# Patient Record
Sex: Female | Born: 1945 | Race: Black or African American | Hispanic: No | Marital: Married | Smoking: Former smoker
Health system: Southern US, Community
[De-identification: ages and names within clinical notes are randomized; demographics above are authoritative.]

## PROBLEM LIST (undated history)

## (undated) DIAGNOSIS — K219 Gastro-esophageal reflux disease without esophagitis: Secondary | ICD-10-CM

## (undated) DIAGNOSIS — R7303 Prediabetes: Secondary | ICD-10-CM

## (undated) DIAGNOSIS — E559 Vitamin D deficiency, unspecified: Secondary | ICD-10-CM

## (undated) DIAGNOSIS — H269 Unspecified cataract: Secondary | ICD-10-CM

## (undated) DIAGNOSIS — T7840XA Allergy, unspecified, initial encounter: Secondary | ICD-10-CM

## (undated) DIAGNOSIS — I10 Essential (primary) hypertension: Secondary | ICD-10-CM

## (undated) DIAGNOSIS — E785 Hyperlipidemia, unspecified: Secondary | ICD-10-CM

## (undated) HISTORY — DX: Prediabetes: R73.03

## (undated) HISTORY — DX: Unspecified cataract: H26.9

## (undated) HISTORY — DX: Essential (primary) hypertension: I10

## (undated) HISTORY — DX: Hyperlipidemia, unspecified: E78.5

## (undated) HISTORY — DX: Allergy, unspecified, initial encounter: T78.40XA

## (undated) HISTORY — DX: Gastro-esophageal reflux disease without esophagitis: K21.9

## (undated) HISTORY — DX: Vitamin D deficiency, unspecified: E55.9

---

## 1975-01-17 HISTORY — PX: ABDOMINAL HYSTERECTOMY: SHX81

## 1998-12-27 ENCOUNTER — Encounter: Payer: Self-pay | Admitting: Internal Medicine

## 1998-12-27 ENCOUNTER — Ambulatory Visit (HOSPITAL_COMMUNITY): Admission: RE | Admit: 1998-12-27 | Discharge: 1998-12-27 | Payer: Self-pay | Admitting: Internal Medicine

## 2001-02-14 ENCOUNTER — Encounter: Payer: Self-pay | Admitting: Internal Medicine

## 2001-02-14 ENCOUNTER — Ambulatory Visit (HOSPITAL_COMMUNITY): Admission: RE | Admit: 2001-02-14 | Discharge: 2001-02-14 | Payer: Self-pay | Admitting: Internal Medicine

## 2003-03-09 ENCOUNTER — Ambulatory Visit (HOSPITAL_COMMUNITY): Admission: RE | Admit: 2003-03-09 | Discharge: 2003-03-09 | Payer: Self-pay | Admitting: Internal Medicine

## 2006-04-20 ENCOUNTER — Ambulatory Visit: Payer: Self-pay | Admitting: Internal Medicine

## 2009-07-29 ENCOUNTER — Encounter: Payer: Self-pay | Admitting: Gastroenterology

## 2009-09-06 ENCOUNTER — Encounter (INDEPENDENT_AMBULATORY_CARE_PROVIDER_SITE_OTHER): Payer: Self-pay | Admitting: *Deleted

## 2009-09-08 ENCOUNTER — Ambulatory Visit: Payer: Self-pay | Admitting: Gastroenterology

## 2009-09-16 ENCOUNTER — Inpatient Hospital Stay (HOSPITAL_COMMUNITY): Admission: EM | Admit: 2009-09-16 | Discharge: 2009-09-17 | Payer: Self-pay | Admitting: Emergency Medicine

## 2009-09-16 HISTORY — PX: KNEE SURGERY: SHX244

## 2009-09-22 ENCOUNTER — Inpatient Hospital Stay (HOSPITAL_COMMUNITY): Admission: EM | Admit: 2009-09-22 | Discharge: 2009-09-27 | Payer: Self-pay | Admitting: Orthopedic Surgery

## 2009-12-16 ENCOUNTER — Encounter (INDEPENDENT_AMBULATORY_CARE_PROVIDER_SITE_OTHER): Payer: Self-pay | Admitting: *Deleted

## 2010-01-13 ENCOUNTER — Encounter (INDEPENDENT_AMBULATORY_CARE_PROVIDER_SITE_OTHER): Payer: Self-pay | Admitting: *Deleted

## 2010-01-18 ENCOUNTER — Ambulatory Visit
Admission: RE | Admit: 2010-01-18 | Discharge: 2010-01-18 | Payer: Self-pay | Source: Home / Self Care | Attending: Gastroenterology | Admitting: Gastroenterology

## 2010-02-01 ENCOUNTER — Ambulatory Visit
Admission: RE | Admit: 2010-02-01 | Discharge: 2010-02-01 | Payer: Self-pay | Source: Home / Self Care | Attending: Gastroenterology | Admitting: Gastroenterology

## 2010-02-01 ENCOUNTER — Other Ambulatory Visit: Payer: Self-pay | Admitting: Gastroenterology

## 2010-02-08 ENCOUNTER — Encounter: Payer: Self-pay | Admitting: Gastroenterology

## 2010-02-17 NOTE — Letter (Signed)
Summary: Pre Visit Letter Revised  [facility]  [address] [place], [place] [address]   Phone: [phone]  Fax: [phone]        12/16/2009 MRN: [id number] [facility] [address] [place], [place]  [address]             Procedure Date:  02-01-10   Welcome to the Gastroenterology Division at [facility].    You are scheduled to see a nurse for your pre-procedure visit on 01-18-10 at 1:30p.m. on the 3rd floor at [facility], [address].  We ask that you try to arrive at our office 15 minutes prior to your appointment time to allow for check-in.  Please take a minute to review the attached form.  If you answer "Yes" to one or more of the questions on the first page, we ask that you call the person listed at your earliest opportunity.  If you answer "No" to all of the questions, please complete the rest of the form and bring it to your appointment.    Your nurse visit will consist of discussing your medical and surgical history, your immediate family medical history, and your medications.   If you are unable to list all of your medications on the form, please bring the medication bottles to your appointment and we will list them.  We will need to be aware of both prescribed and over the counter drugs.  We will need to know exact dosage information as well.    Please be prepared to read and sign documents such as consent forms, a financial agreement, and acknowledgement forms.  If necessary, and with your consent, a friend or relative is welcome to sit-in on the nurse visit with you.  Please bring your insurance card so that we may make a copy of it.  If your insurance requires a referral to see a specialist, please bring your referral form from your primary care physician.  No co-pay is required for this nurse visit.     If you cannot keep your appointment, please call [phone] to cancel or reschedule prior to your appointment date.  This  allows [place] the opportunity to schedule an appointment for another patient in need of care.    Thank you for choosing  Gastroenterology for your medical needs.  We appreciate the opportunity to care for you.  Please visit [place] at our website  to learn more about our practice.  Sincerely, The Gastroenterology Division

## 2010-02-17 NOTE — Miscellaneous (Signed)
Summary: [facility] Previsit/prep  Clinical Lists Changes  Observations: Added new observation of NKA: T (01/18/2010 13:04)

## 2010-02-17 NOTE — Letter (Signed)
Summary: [facility] Instructions  [facility]  [address] [place], [place] [address]   Phone: [phone]  Fax: [phone]       JACQUELYNE QUARRY    03/26/1945    MRN: [id number]        Procedure Day Dorna Bloom:  [facility]  09/22/09     Arrival Time:  9:00AM      Procedure Time:  10:00AM     Location of Procedure:                    _ X_  [facility] (4th Floor)                       PREPARATION FOR COLONOSCOPY WITH MOVIPREP   Starting 5 days prior to your procedure 09/17/09 do not eat nuts, seeds, popcorn, corn, beans, peas,  salads, or any raw vegetables.  Do not take any fiber supplements (e.g. Metamucil, Citrucel, and Benefiber).  THE DAY BEFORE YOUR PROCEDURE         DATE: 09/21/09  DAY: TUESDAY  1.  Drink clear liquids the entire day-NO SOLID FOOD  2.  Do not drink anything colored red or purple.  Avoid juices with pulp.  No orange juice.  3.  Drink at least 64 oz. (8 glasses) of fluid/clear liquids during the day to prevent dehydration and help the prep work efficiently.  CLEAR LIQUIDS INCLUDE: Water Jello Ice Popsicles Tea (sugar ok, no milk/cream) Powdered fruit flavored drinks Coffee (sugar ok, no milk/cream) Gatorade Juice: apple, white grape, white cranberry  Lemonade Clear bullion, consomm, broth Carbonated beverages (any kind) Strained chicken noodle soup Hard Candy                             4.  In the morning, mix first dose of MoviPrep solution:    Empty 1 Pouch A and 1 Pouch B into the disposable container    Add lukewarm drinking water to the top line of the container. Mix to dissolve    Refrigerate (mixed solution should be used within 24 hrs)  5.  Begin drinking the prep at 5:00 p.m. The MoviPrep container is divided by 4 marks.   Every 15 minutes drink the solution down to the next mark (approximately 8 oz) until the full liter is complete.   6.  Follow completed prep with 16 oz of clear liquid of your choice (Nothing  red or purple).  Continue to drink clear liquids until bedtime.  7.  Before going to bed, mix second dose of MoviPrep solution:    Empty 1 Pouch A and 1 Pouch B into the disposable container    Add lukewarm drinking water to the top line of the container. Mix to dissolve    Refrigerate  THE DAY OF YOUR PROCEDURE      DATE: 09/22/09  DAY: WEDNESDAY  Beginning at 5:00AM (5 hours before procedure):         1. Every 15 minutes, drink the solution down to the next mark (approx 8 oz) until the full liter is complete.  2. Follow completed prep with 16 oz. of clear liquid of your choice.    3. You may drink clear liquids until 8:00AM (2 HOURS BEFORE PROCEDURE).   MEDICATION INSTRUCTIONS  Unless otherwise instructed, you should take regular prescription medications with a small sip of water   as early as possible the morning  of your procedure.  Additional instructions:  Hold HCTZ the morning of procedure.       OTHER INSTRUCTIONS  You will need a responsible adult at least 66 years of age to accompany you and drive you home.   This person must remain in the waiting room during your procedure.  Wear loose fitting clothing that is easily removed.  Leave jewelry and other valuables at home.  However, you may wish to bring a book to read or  an iPod/MP3 player to listen to music as you wait for your procedure to start.  Remove all body piercing jewelry and leave at home.  Total time from sign-in until discharge is approximately 2-3 hours.  You should go home directly after your procedure and rest.  You can resume normal activities the  day after your procedure.  The day of your procedure you should not:   Drive   Make legal decisions   Operate machinery   Drink alcohol   Return to work  You will receive specific instructions about eating, activities and medications before you leave.    The above instructions have been reviewed and explained to me by   Wyona Almas  RN  September 08, 2009 10:12 AM     I fully understand and can verbalize these instructions _____________________________ Date _________

## 2010-02-17 NOTE — Procedures (Addendum)
Summary: Colonoscopy  Patient: Kaitlyn Townsend Note: All result statuses are Final unless otherwise noted.  Tests: (1) Colonoscopy (COL)   COL Colonoscopy           DONE     [facility]     [address]     [place], [place]  [address]           COLONOSCOPY PROCEDURE REPORT           PATIENT:  Kaitlyn Townsend, Kaitlyn Townsend  MR#:  [id number]     BIRTHDATE:  11/07/1945, 64 yrs. old  GENDER:  female           ENDOSCOPIST:  Barbette Hair. Arlyce Dice, MD     Referred by:  Lucky Cowboy, M.D.           PROCEDURE DATE:  02/01/2010     PROCEDURE:  Colon with cold biopsy polypectomy     ASA CLASS:  Class I     INDICATIONS:  1) Routine Risk Screening           MEDICATIONS:   Fentanyl 75 mcg IV, Versed 8 mg IV           DESCRIPTION OF PROCEDURE:   After the risks benefits and     alternatives of the procedure were thoroughly explained, informed     consent was obtained.  Digital rectal exam was performed and     revealed no abnormalities.   The LB 180AL [id number] endoscope was     introduced through the anus and advanced to the cecum, which was     identified by the ileocecal valve, without limitations.  The     quality of the prep was excellent, using MoviPrep.  The instrument     was then slowly withdrawn as the colon was fully examined.     <<PROCEDUREIMAGES>>           FINDINGS:  Two polyps were found in the ascending colon. 2 1-43mm     sessile polyps The polyp was removed using cold biopsy forceps.     Scattered diverticula were found (see image2). sigmoid to cecum     Internal hemorrhoids were found (see image10).  This was otherwise     a normal examination of the colon (see image4, image5, and     image9).   Retroflexed views in the rectum revealed no     abnormalities.    The time to cecum =  2.50  minutes. The scope     was then withdrawn (time =  9.25  min) from the patient and the     procedure completed.           COMPLICATIONS:  None           ENDOSCOPIC IMPRESSION:     1) Two  polyps in the ascending colon     2) Diverticula, scattered     3) Internal hemorrhoids     4) Otherwise normal examination     RECOMMENDATIONS:     1) If the polyp(s) removed today are proven to be adenomatous     (pre-cancerous) polyps, you will need a repeat colonoscopy in 5     years. Otherwise you should continue to follow colorectal cancer     screening guidelines for "routine risk" patients with colonoscopy     in 10 years.           REPEAT EXAM:   You will receive a letter from Dr. Arlyce Dice in 1-2  weeks, after reviewing the final pathology, with followup     recommendations.           ______________________________     Barbette Hair Arlyce Dice, MD           CC:           n.     eSIGNED:   Barbette Hair. Chanie Soucek at 02/01/2010 09:30 AM           Kaitlyn Townsend, [id number]  Note: An exclamation mark (!) indicates a result that was not dispersed into the flowsheet. Document Creation Date: 02/01/2010 9:30 AM _______________________________________________________________________  (1) Order result status: Final Collection or observation date-time: 02/01/2010 09:17 Requested date-time:  Receipt date-time:  Reported date-time:  Referring Physician:   Ordering Physician: Melvia Heaps [phone]) Specimen Source:  Source: Launa Grill Order Number: [id number] Lab site:   Appended Document: Colonoscopy     Procedures Next Due Date:    Colonoscopy: 01/2015

## 2010-02-17 NOTE — Letter (Signed)
Summary: Patient Notice- Polyp Results  [facility]  [address] [place], [place] [address]   Phone: [phone]  Fax: [phone]        February 08, 2010 MRN: [id number]    [facility] [address] [place] [address] [place], [place]  [address]    Dear Ms. Krigbaum,  I am pleased to inform you that the colon polyp(s) removed during your recent colonoscopy was (were) found to be benign (no cancer detected) upon pathologic examination.  I recommend you have a repeat colonoscopy examination in _5 years to look for recurrent polyps, as having colon polyps increases your risk for having recurrent polyps or even colon cancer in the future.  Should you develop new or worsening symptoms of abdominal pain, bowel habit changes or bleeding from the rectum or bowels, please schedule an evaluation with either your primary care physician or with me.  Additional information/recommendations:  __ No further action with gastroenterology is needed at this time. Please      follow-up with your primary care physician for your other healthcare      needs.  __ Please call [phone] to schedule a return visit to review your      situation.  __ Please keep your follow-up visit as already scheduled.  __ Continue treatment plan as outlined the day of your exam.  Please call us if you are having persistent problems or have questions about your condition that have not been fully answered at this time.  Sincerely,  Louis Meckel MD  This letter has been electronically signed by your physician.  Appended Document: Patient Notice- Polyp Results LETTER MAILED

## 2010-02-17 NOTE — Letter (Signed)
Summary: Moviprep Instructions  [facility]  [address]   [place], [place] [address]   Phone: [phone]  Fax: [phone]       Kaitlyn Townsend    1945-09-11    MRN: [id number]        Procedure Day /Date: Tuesday  02-01-10     Arrival Time: 8:00 a.m.     Procedure Time: 9:00 a.m.     Location of Procedure:                    x   [facility] (4th Floor)                        PREPARATION FOR COLONOSCOPY WITH MOVIPREP   Starting 5 days prior to your procedure 01-27-10 do not eat nuts, seeds, popcorn, corn, beans, peas,  salads, or any raw vegetables.  Do not take any fiber supplements (e.g. Metamucil, Citrucel, and Benefiber).  THE DAY BEFORE YOUR PROCEDURE         DATE: 01-31-10  DAY: Monday  1.  Drink clear liquids the entire day-NO SOLID FOOD  2.  Do not drink anything colored red or purple.  Avoid juices with pulp.  No orange juice.  3.  Drink at least 64 oz. (8 glasses) of fluid/clear liquids during the day to prevent dehydration and help the prep work efficiently.  CLEAR LIQUIDS INCLUDE: Water Jello Ice Popsicles Tea (sugar ok, no milk/cream) Powdered fruit flavored drinks Coffee (sugar ok, no milk/cream) Gatorade Juice: apple, white grape, white cranberry  Lemonade Clear bullion, consomm, broth Carbonated beverages (any kind) Strained chicken noodle soup Hard Candy                             4.  In the morning, mix first dose of MoviPrep solution:    Empty 1 Pouch A and 1 Pouch B into the disposable container    Add lukewarm drinking water to the top line of the container. Mix to dissolve    Refrigerate (mixed solution should be used within 24 hrs)  5.  Begin drinking the prep at 5:00 p.m. The MoviPrep container is divided by 4 marks.   Every 15 minutes drink the solution down to the next mark (approximately 8 oz) until the full liter is complete.   6.  Follow completed prep with 16 oz of clear liquid of your choice  (Nothing red or purple).  Continue to drink clear liquids until bedtime.  7.  Before going to bed, mix second dose of MoviPrep solution:    Empty 1 Pouch A and 1 Pouch B into the disposable container    Add lukewarm drinking water to the top line of the container. Mix to dissolve            Refrigerate THE DAY OF YOUR PROCEDURE      DATE: 02-01-10  DAY: Tuesday  Beginning at 4:00 a.m. (5 hours before procedure):         1. Every 15 minutes, drink the solution down to the next mark (approx 8 oz) until the full liter is complete.  2. Follow completed prep with 16 oz. of clear liquid of your choice.    3. You may drink clear liquids until 7:00 a.m.  (2 HOURS BEFORE PROCEDURE).   MEDICATION INSTRUCTIONS  Unless otherwise instructed, you should take regular prescription medications with a small sip of  water   as early as possible the morning of your procedure.    Additional medication instructions: Hold HCTZ the morning of procedure.         OTHER INSTRUCTIONS  You will need a responsible adult at least 66 years of age to accompany you and drive you home.   This person must remain in the waiting room during your procedure.  Wear loose fitting clothing that is easily removed.  Leave jewelry and other valuables at home.  However, you may wish to bring a book to read or  an iPod/MP3 player to listen to music as you wait for your procedure to start.  Remove all body piercing jewelry and leave at home.  Total time from sign-in until discharge is approximately 2-3 hours.  You should go home directly after your procedure and rest.  You can resume normal activities the  day after your procedure.  The day of your procedure you should not:   Drive   Make legal decisions   Operate machinery   Drink alcohol   Return to work  You will receive specific instructions about eating, activities and medications before you leave.    The above instructions have been reviewed and  explained to me by   Wyona Almas RN  January 18, 2010 1:34 PM     I fully understand and can verbalize these instructions _____________________________ Date _________

## 2010-02-17 NOTE — Letter (Signed)
Summary: Previsit letter  [facility]  [address] [place], [place] [address]   Phone: [phone]  Fax: [phone]       07/29/2009 MRN: [id number]  [facility] [address] [place], [place]  [address]  Dear Kaitlyn Townsend,  Welcome to the Gastroenterology Division at [facility].    You are scheduled to see a nurse for your pre-procedure visit on 09-08-08  at 10am onthe 3rd floor at [facility], [address].  We ask that you try to arrive at our office 15 minutes prior to your appointment time to allow for check-in.  Your nurse visit will consist of discussing your medical and surgical history, your immediate family medical history, and your medications.    Please bring a complete list of all your medications or, if you prefer, bring the medication bottles and we will list them.  We will need to be aware of both prescribed and over the counter drugs.  We will need to know exact dosage information as well.  If you are on blood thinners (Coumadin, Plavix, Aggrenox, Ticlid, etc.) please call our office today/prior to your appointment, as we need to consult with your physician about holding your medication.   Please be prepared to read and sign documents such as consent forms, a financial agreement, and acknowledgement forms.  If necessary, and with your consent, a friend or relative is welcome to sit-in on the nurse visit with you.  Please bring your insurance card so that we may make a copy of it.  If your insurance requires a referral to see a specialist, please bring your referral form from your primary care physician.  No co-pay is required for this nurse visit.     If you cannot keep your appointment, please call [phone] to cancel or reschedule prior to your appointment date.  This allows [place] the opportunity to schedule an appointment for another patient in need of care.    Thank you for choosing [facility] for your medical needs.   We appreciate the opportunity to care for you.  Please visit [place] at our website  to learn more about our practice.                     Sincerely.                                                                                                                   The Gastroenterology Division

## 2010-02-17 NOTE — Miscellaneous (Signed)
Summary: [facility] Previsit/prep  Clinical Lists Changes  Medications: Added new medication of MOVIPREP 100 GM  SOLR (PEG-KCL-NACL-NASULF-NA ASC-C) As per prep instructions. - Signed Rx of MOVIPREP 100 GM  SOLR (PEG-KCL-NACL-NASULF-NA ASC-C) As per prep instructions.;  #1 x 0;  Signed;  Entered by: Wyona Almas RN;  Authorized by: Louis Meckel MD;  Method used: Electronically to [facility] [address] [phone] [place], [place], [place]  [address], Ph: [phone], Fax: [phone] Observations: Added new observation of NKA: T (09/08/2009 9:36)    Prescriptions: MOVIPREP 100 GM  SOLR (PEG-KCL-NACL-NASULF-NA ASC-C) As per prep instructions.  #1 x 0   Entered by:   Wyona Almas RN   Authorized by:   Louis Meckel MD   Signed by:   Wyona Almas RN on 09/08/2009   Method used:   Electronically to        [facility] [address] (retail)       [phone]       [place], [place]  [address]       Ph: [phone]       Fax: [phone]   RxID:   [id number]

## 2010-03-31 LAB — BASIC METABOLIC PANEL
BUN: 4 mg/dL — ABNORMAL LOW (ref 6–23)
Chloride: 101 mEq/L (ref 96–112)
Chloride: 104 mEq/L (ref 96–112)
GFR calc Af Amer: >60 mL/min (ref >60)
GFR calc non Af Amer: >60 mL/min (ref >60)
GFR calc non Af Amer: >60 mL/min (ref >60)
Glucose, Bld: 129 mg/dL — ABNORMAL HIGH (ref 70–99)
Potassium: 3.4 mEq/L — ABNORMAL LOW (ref 3.5–5.1)
Potassium: 3.8 mEq/L (ref 3.5–5.1)
Sodium: 137 mEq/L (ref 135–145)
Sodium: 138 mEq/L (ref 135–145)

## 2010-03-31 LAB — VITAMIN D 1,25 DIHYDROXY
Vitamin D 1, 25 (OH)2 Total: 41 pg/mL (ref 18–72)
Vitamin D2 1, 25 (OH)2: <8 pg/mL

## 2010-03-31 LAB — CBC
HCT: 25.3 % — ABNORMAL LOW (ref 36.0–46.0)
HCT: 27.5 % — ABNORMAL LOW (ref 36.0–46.0)
HCT: 35.9 % — ABNORMAL LOW (ref 36.0–46.0)
Hemoglobin: 12.2 g/dL (ref 12.0–15.0)
Hemoglobin: 8.5 g/dL — ABNORMAL LOW (ref 12.0–15.0)
MCH: 28.1 pg (ref 26.0–34.0)
MCHC: 33.6 g/dL (ref 30.0–36.0)
MCHC: 34 g/dL (ref 30.0–36.0)
MCV: 82.7 fL (ref 78.0–100.0)
MCV: 83.5 fL (ref 78.0–100.0)
MCV: 85.1 fL (ref 78.0–100.0)
RBC: 3.23 MIL/uL — ABNORMAL LOW (ref 3.87–5.11)
RBC: 4.34 MIL/uL (ref 3.87–5.11)
RDW: 14.8 % (ref 11.5–15.5)
WBC: 7.4 10*3/uL (ref 4.0–10.5)
WBC: 7.8 10*3/uL (ref 4.0–10.5)

## 2010-03-31 LAB — COMPREHENSIVE METABOLIC PANEL
AST: 37 U/L (ref 0–37)
CO2: 28 mEq/L (ref 19–32)
Chloride: 100 mEq/L (ref 96–112)
Creatinine, Ser: 0.86 mg/dL (ref 0.4–1.2)
GFR calc Af Amer: >60 mL/min (ref >60)
GFR calc non Af Amer: >60 mL/min (ref >60)
Glucose, Bld: 121 mg/dL — ABNORMAL HIGH (ref 70–99)
Total Bilirubin: 1.1 mg/dL (ref 0.3–1.2)

## 2010-03-31 LAB — PROTIME-INR
INR: 1.13 (ref 0.00–1.49)
Prothrombin Time: 14.7 seconds (ref 11.6–15.2)

## 2010-03-31 LAB — URINALYSIS, ROUTINE W REFLEX MICROSCOPIC
Bilirubin Urine: NEGATIVE
Glucose, UA: NEGATIVE mg/dL
Hgb urine dipstick: NEGATIVE
Specific Gravity, Urine: 1.011 (ref 1.005–1.030)

## 2010-03-31 LAB — DIFFERENTIAL
Basophils Absolute: 0 10*3/uL (ref 0.0–0.1)
Eosinophils Absolute: 0 10*3/uL (ref 0.0–0.7)
Eosinophils Relative: 0 % (ref 0–5)
Lymphocytes Relative: 23 % (ref 12–46)
Neutrophils Relative %: 67 % (ref 43–77)

## 2010-03-31 LAB — URINE CULTURE

## 2011-07-25 ENCOUNTER — Ambulatory Visit: Payer: Medicare Other | Attending: Orthopedic Surgery | Admitting: Physical Therapy

## 2011-07-25 DIAGNOSIS — M25559 Pain in unspecified hip: Secondary | ICD-10-CM | POA: Insufficient documentation

## 2011-07-25 DIAGNOSIS — IMO0001 Reserved for inherently not codable concepts without codable children: Secondary | ICD-10-CM | POA: Insufficient documentation

## 2011-07-25 DIAGNOSIS — M25569 Pain in unspecified knee: Secondary | ICD-10-CM | POA: Insufficient documentation

## 2011-08-04 ENCOUNTER — Ambulatory Visit: Payer: Medicare Other | Admitting: Physical Therapy

## 2011-08-09 ENCOUNTER — Ambulatory Visit: Payer: Medicare Other | Admitting: Physical Therapy

## 2011-08-11 ENCOUNTER — Ambulatory Visit: Payer: Medicare Other | Admitting: Physical Therapy

## 2011-08-18 ENCOUNTER — Ambulatory Visit: Payer: Medicare Other | Attending: Orthopedic Surgery | Admitting: Physical Therapy

## 2011-08-18 DIAGNOSIS — M25559 Pain in unspecified hip: Secondary | ICD-10-CM | POA: Insufficient documentation

## 2011-08-18 DIAGNOSIS — M25569 Pain in unspecified knee: Secondary | ICD-10-CM | POA: Insufficient documentation

## 2011-08-18 DIAGNOSIS — IMO0001 Reserved for inherently not codable concepts without codable children: Secondary | ICD-10-CM | POA: Insufficient documentation

## 2012-08-12 ENCOUNTER — Other Ambulatory Visit (HOSPITAL_COMMUNITY): Payer: Self-pay | Admitting: Internal Medicine

## 2012-08-12 DIAGNOSIS — Z1231 Encounter for screening mammogram for malignant neoplasm of breast: Secondary | ICD-10-CM

## 2012-08-13 ENCOUNTER — Other Ambulatory Visit (HOSPITAL_COMMUNITY): Payer: Self-pay | Admitting: Internal Medicine

## 2012-08-13 DIAGNOSIS — E2839 Other primary ovarian failure: Secondary | ICD-10-CM

## 2012-08-21 ENCOUNTER — Ambulatory Visit (HOSPITAL_COMMUNITY): Payer: Medicare Other

## 2012-08-22 ENCOUNTER — Ambulatory Visit (HOSPITAL_COMMUNITY)
Admission: RE | Admit: 2012-08-22 | Discharge: 2012-08-22 | Disposition: A | Discharge status: Home / Self Care | Payer: Medicare Other | Source: Ambulatory Visit | Attending: Internal Medicine | Admitting: Internal Medicine

## 2012-08-22 DIAGNOSIS — Z1231 Encounter for screening mammogram for malignant neoplasm of breast: Secondary | ICD-10-CM | POA: Insufficient documentation

## 2012-08-22 DIAGNOSIS — E2839 Other primary ovarian failure: Secondary | ICD-10-CM

## 2012-08-22 DIAGNOSIS — Z13820 Encounter for screening for osteoporosis: Secondary | ICD-10-CM | POA: Insufficient documentation

## 2012-08-22 DIAGNOSIS — Z780 Asymptomatic menopausal state: Secondary | ICD-10-CM | POA: Insufficient documentation

## 2012-12-06 ENCOUNTER — Other Ambulatory Visit: Payer: Self-pay | Admitting: Emergency Medicine

## 2012-12-18 ENCOUNTER — Ambulatory Visit: Payer: Medicare Other

## 2012-12-18 VITALS — BP 110/70 | HR 80 | Temp 97.9°F | Resp 16 | Ht 64.0 in | Wt 167.0 lb

## 2012-12-18 DIAGNOSIS — N3000 Acute cystitis without hematuria: Secondary | ICD-10-CM

## 2012-12-18 LAB — URINALYSIS, ROUTINE W REFLEX MICROSCOPIC
Bilirubin Urine: NEGATIVE
Leukocytes, UA: NEGATIVE
Protein, ur: NEGATIVE mg/dL
Specific Gravity, Urine: 1.011 (ref 1.005–1.030)
Urobilinogen, UA: 0.2 mg/dL (ref 0.0–1.0)

## 2012-12-19 LAB — URINE CULTURE: Colony Count: 9000

## 2013-01-13 ENCOUNTER — Other Ambulatory Visit: Payer: Self-pay | Admitting: Physician Assistant

## 2013-01-13 MED ORDER — ATORVASTATIN CALCIUM 80 MG PO TABS: 80.0000 mg | ORAL_TABLET | Freq: Every day | ORAL | Status: DC

## 2013-02-12 DIAGNOSIS — I10 Essential (primary) hypertension: Secondary | ICD-10-CM | POA: Insufficient documentation

## 2013-02-12 DIAGNOSIS — E785 Hyperlipidemia, unspecified: Secondary | ICD-10-CM

## 2013-02-12 DIAGNOSIS — E1169 Type 2 diabetes mellitus with other specified complication: Secondary | ICD-10-CM | POA: Insufficient documentation

## 2013-02-12 DIAGNOSIS — K219 Gastro-esophageal reflux disease without esophagitis: Secondary | ICD-10-CM | POA: Insufficient documentation

## 2013-02-12 DIAGNOSIS — E559 Vitamin D deficiency, unspecified: Secondary | ICD-10-CM | POA: Insufficient documentation

## 2013-02-13 ENCOUNTER — Encounter: Payer: Self-pay | Admitting: Internal Medicine

## 2013-02-13 ENCOUNTER — Ambulatory Visit (INDEPENDENT_AMBULATORY_CARE_PROVIDER_SITE_OTHER): Payer: Medicare Other | Admitting: Internal Medicine

## 2013-02-13 VITALS — BP 114/78 | HR 80 | Temp 97.9°F | Resp 16 | Wt 168.4 lb

## 2013-02-13 DIAGNOSIS — I10 Essential (primary) hypertension: Secondary | ICD-10-CM

## 2013-02-13 DIAGNOSIS — E559 Vitamin D deficiency, unspecified: Secondary | ICD-10-CM

## 2013-02-13 DIAGNOSIS — E785 Hyperlipidemia, unspecified: Secondary | ICD-10-CM

## 2013-02-13 DIAGNOSIS — R7303 Prediabetes: Secondary | ICD-10-CM

## 2013-02-13 DIAGNOSIS — Z79899 Other long term (current) drug therapy: Secondary | ICD-10-CM | POA: Insufficient documentation

## 2013-02-13 LAB — MAGNESIUM: Magnesium: 1.8 mg/dL (ref 1.5–2.5)

## 2013-02-13 LAB — HEPATIC FUNCTION PANEL
ALK PHOS: 78 U/L (ref 39–117)
ALT: 21 U/L (ref 0–35)
AST: 21 U/L (ref 0–37)
Albumin: 4.1 g/dL (ref 3.5–5.2)
BILIRUBIN DIRECT: 0.1 mg/dL (ref 0.0–0.3)
BILIRUBIN INDIRECT: 0.4 mg/dL (ref 0.2–1.2)
Total Bilirubin: 0.5 mg/dL (ref 0.2–1.2)
Total Protein: 7.2 g/dL (ref 6.0–8.3)

## 2013-02-13 LAB — CBC WITH DIFFERENTIAL/PLATELET
BASOS PCT: 1 % (ref 0–1)
Basophils Absolute: 0 10*3/uL (ref 0.0–0.1)
Eosinophils Absolute: 0.1 10*3/uL (ref 0.0–0.7)
Eosinophils Relative: 1 % (ref 0–5)
HCT: 41.7 % (ref 36.0–46.0)
HEMOGLOBIN: 14 g/dL (ref 12.0–15.0)
Lymphocytes Relative: 35 % (ref 12–46)
Lymphs Abs: 1.7 10*3/uL (ref 0.7–4.0)
MCH: 28.5 pg (ref 26.0–34.0)
MCHC: 33.6 g/dL (ref 30.0–36.0)
MCV: 84.9 fL (ref 78.0–100.0)
MONO ABS: 0.5 10*3/uL (ref 0.1–1.0)
MONOS PCT: 10 % (ref 3–12)
Neutro Abs: 2.5 10*3/uL (ref 1.7–7.7)
Neutrophils Relative %: 53 % (ref 43–77)
Platelets: 301 10*3/uL (ref 150–400)
RBC: 4.91 MIL/uL (ref 3.87–5.11)
RDW: 14.4 % (ref 11.5–15.5)
WBC: 4.7 10*3/uL (ref 4.0–10.5)

## 2013-02-13 LAB — BASIC METABOLIC PANEL WITH GFR
BUN: 12 mg/dL (ref 6–23)
CO2: 30 meq/L (ref 19–32)
CREATININE: 0.83 mg/dL (ref 0.50–1.10)
Calcium: 9.7 mg/dL (ref 8.4–10.5)
Chloride: 103 mEq/L (ref 96–112)
GFR, Est African American: 84 mL/min
GFR, Est Non African American: 73 mL/min
GLUCOSE: 110 mg/dL — AB (ref 70–99)
Potassium: 3.9 mEq/L (ref 3.5–5.3)
Sodium: 140 mEq/L (ref 135–145)

## 2013-02-13 LAB — LIPID PANEL
Cholesterol: 161 mg/dL (ref 0–200)
HDL: 48 mg/dL (ref >39)
LDL Cholesterol: 73 mg/dL (ref 0–99)
Total CHOL/HDL Ratio: 3.4 Ratio
Triglycerides: 200 mg/dL — ABNORMAL HIGH (ref <150)
VLDL: 40 mg/dL (ref 0–40)

## 2013-02-13 LAB — HEMOGLOBIN A1C
Hgb A1c MFr Bld: 6.3 % — ABNORMAL HIGH (ref <5.7)
Mean Plasma Glucose: 134 mg/dL — ABNORMAL HIGH (ref <117)

## 2013-02-13 LAB — TSH: TSH: 1.405 u[IU]/mL (ref 0.350–4.500)

## 2013-02-13 NOTE — Patient Instructions (Signed)

## 2013-02-13 NOTE — Progress Notes (Signed)
Patient ID: Kaitlyn Townsend, female   DOB: Apr 22, 1945, 68 y.o.   MRN: [id number]   This very nice 68 y.o. MBF presents for 3 month follow up with Hypertension, Hyperlipidemia, Pre-Diabetes and Vitamin D Deficiency.    HTN predates since 1995. BP has been controlled at home. Today's BP: 114/78 mmHg . Patient denies any cardiac type chest pain, palpitations, dyspnea/orthopnea/PND, dizziness, claudication, or dependent edema.   Hyperlipidemia is controlled with diet & meds. Last Cholesterol was 148, Triglycerides were 146, HDL 46 and LDL 73 in Oct 2014 - at goal. Patient denies myalgias or other med SE's.    Also, the patient has history of PreDiabetes 6.0% in 2011 and with last A1c of 6.1% in Oct 2014. Patient denies any symptoms of reactive hypoglycemia, diabetic polys, paresthesias or visual blurring.   Further, Patient has history of Vitamin D Deficiency of 61 in 2008 and with last vitamin D of 83 in Oct 2014. Patient supplements vitamin D without any suspected side-effects.    Medication List         aspirin 81 MG chewable tablet  Chew by mouth daily.     atorvastatin 80 MG tablet  Commonly known as:  LIPITOR  Take 1 tablet (80 mg total) by mouth daily.     CALCIUM PO  Take by mouth daily.     enalapril 20 MG tablet  Commonly known as:  VASOTEC  Take 20 mg by mouth daily.     estradiol 1 MG tablet  Commonly known as:  ESTRACE  Take 1 mg by mouth daily.     FISH OIL PO  Take by mouth daily.     FREESTYLE LITE test strip  Generic drug:  glucose blood  TEST ONCE DAILY     hydrochlorothiazide 25 MG tablet  Commonly known as:  HYDRODIURIL  Take 25 mg by mouth daily.     MAGNESIUM PO  Take 800 mg by mouth daily.     VITAMIN D PO  Take 5,000 Int'l Units by mouth daily.         No Known Allergies  PMHx:   Past Medical History  Diagnosis Date  . Hyperlipidemia   . Hypertension   . Prediabetes   . Vitamin D deficiency   . Allergy   . GERD (gastroesophageal reflux  disease)     FHx:    Reviewed / unchanged  SHx:    Reviewed / unchanged  Systems Review: Constitutional: Denies fever, chills, wt changes, headaches, insomnia, fatigue, night sweats, change in appetite. Eyes: Denies redness, blurred vision, diplopia, discharge, itchy, watery eyes.  ENT: Denies discharge, congestion, post nasal drip, epistaxis, sore throat, earache, hearing loss, dental pain, tinnitus, vertigo, sinus pain, snoring.  CV: Denies chest pain, palpitations, irregular heartbeat, syncope, dyspnea, diaphoresis, orthopnea, PND, claudication, edema. Respiratory: denies cough, dyspnea, DOE, pleurisy, hoarseness, laryngitis, wheezing.  Gastrointestinal: Denies dysphagia, odynophagia, heartburn, reflux, water brash, abdominal pain or cramps, nausea, vomiting, bloating, diarrhea, constipation, hematemesis, melena, hematochezia,  or hemorrhoids. Genitourinary: Denies dysuria, frequency, urgency, nocturia, hesitancy, discharge, hematuria, flank pain. Musculoskeletal: Denies arthralgias, myalgias, stiffness, jt. swelling, pain, limp, strain/sprain.  Skin: Denies pruritus, rash, hives, warts, acne, eczema, change in skin lesion(s). Neuro: No weakness, tremor, incoordination, spasms, paresthesia, or pain. Psychiatric: Denies confusion, memory loss, or sensory loss. Endo: Denies change in weight, skin, hair change.  Heme/Lymph: No excessive bleeding, bruising, orenlarged lymph nodes.  BP: 114/78  Pulse: 80  Temp: 97.9 F (36.6 C)  Resp: 16  Estimated body mass index is 28.89 kg/(m^2) as calculated from the following:   Height as of 12/18/12: 5\' 4"  (1.626 m).   Weight as of this encounter: 168 lb 6.4 oz (76.386 kg).  On Exam: Appears well nourished - in no distress. Eyes: PERRLA, EOMs, conjunctiva no swelling or erythema. Sinuses: No frontal/maxillary tenderness ENT/Mouth: EAC's clear, TM's nl w/o erythema, bulging. Nares clear w/o erythema, swelling, exudates. Oropharynx clear  without erythema or exudates. Oral hygiene is good. Tongue normal, non obstructing. Hearing intact.  Neck: Supple. Thyroid nl. Car 2+/2+ without bruits, nodes or JVD. Chest: Respirations nl with BS clear & equal w/o rales, rhonchi, wheezing or stridor.  Cor: Heart sounds normal w/ regular rate and rhythm without sig. murmurs, gallops, clicks, or rubs. Peripheral pulses normal and equal  without edema.  Abdomen: Soft & bowel sounds normal. Non-tender w/o guarding, rebound, hernias, masses, or organomegaly.  Lymphatics: Unremarkable.  Musculoskeletal: Full ROM all peripheral extremities, joint stability, 5/5 strength, and normal gait.  Skin: Warm, dry without exposed rashes, lesions, ecchymosis apparent.  Neuro: Cranial nerves intact, reflexes equal bilaterally. Sensory-motor testing grossly intact. Tendon reflexes grossly intact.  Pysch: Alert & oriented x 3. Insight and judgement nl & appropriate. No ideations.  Assessment and Plan:  1. Hypertension - Continue monitor blood pressure at home. Continue diet/meds same.  2. Hyperlipidemia - Continue diet/meds, exercise,& lifestyle modifications. Continue monitor periodic cholesterol/liver & renal functions   3. Pre-diabetes - Continue diet, exercise, lifestyle modifications. Monitor appropriate labs.  4. Vitamin D Deficiency - Continue supplementation.  Recommended regular exercise, BP monitoring, weight control, and discussed med and SE's. Recommended labs to assess and monitor clinical status. Further disposition pending results of labs.

## 2013-02-14 LAB — INSULIN, FASTING: INSULIN FASTING, SERUM: 18 u[IU]/mL (ref 3–28)

## 2013-02-14 LAB — VITAMIN D 25 HYDROXY (VIT D DEFICIENCY, FRACTURES): Vit D, 25-Hydroxy: 81 ng/mL (ref 30–89)

## 2013-05-14 ENCOUNTER — Ambulatory Visit (INDEPENDENT_AMBULATORY_CARE_PROVIDER_SITE_OTHER): Payer: Medicare Other | Admitting: Emergency Medicine

## 2013-05-14 ENCOUNTER — Encounter: Payer: Self-pay | Admitting: Emergency Medicine

## 2013-05-14 VITALS — BP 100/58 | HR 78 | Temp 98.0°F | Resp 18 | Ht 64.75 in | Wt 164.0 lb

## 2013-05-14 DIAGNOSIS — J309 Allergic rhinitis, unspecified: Secondary | ICD-10-CM

## 2013-05-14 DIAGNOSIS — I10 Essential (primary) hypertension: Secondary | ICD-10-CM

## 2013-05-14 DIAGNOSIS — R7309 Other abnormal glucose: Secondary | ICD-10-CM

## 2013-05-14 DIAGNOSIS — E782 Mixed hyperlipidemia: Secondary | ICD-10-CM

## 2013-05-14 LAB — LIPID PANEL
Cholesterol: 152 mg/dL (ref 0–200)
HDL: 44 mg/dL (ref >39)
LDL CALC: 74 mg/dL (ref 0–99)
Total CHOL/HDL Ratio: 3.5 Ratio
Triglycerides: 168 mg/dL — ABNORMAL HIGH (ref <150)
VLDL: 34 mg/dL (ref 0–40)

## 2013-05-14 LAB — CBC WITH DIFFERENTIAL/PLATELET
BASOS ABS: 0 10*3/uL (ref 0.0–0.1)
BASOS PCT: 1 % (ref 0–1)
EOS ABS: 0.1 10*3/uL (ref 0.0–0.7)
EOS PCT: 2 % (ref 0–5)
HCT: 40.8 % (ref 36.0–46.0)
Hemoglobin: 14.3 g/dL (ref 12.0–15.0)
LYMPHS ABS: 1.6 10*3/uL (ref 0.7–4.0)
Lymphocytes Relative: 32 % (ref 12–46)
MCH: 28.6 pg (ref 26.0–34.0)
MCHC: 35 g/dL (ref 30.0–36.0)
MCV: 81.6 fL (ref 78.0–100.0)
Monocytes Absolute: 0.5 10*3/uL (ref 0.1–1.0)
Monocytes Relative: 11 % (ref 3–12)
NEUTROS PCT: 54 % (ref 43–77)
Neutro Abs: 2.6 10*3/uL (ref 1.7–7.7)
PLATELETS: 281 10*3/uL (ref 150–400)
RBC: 5 MIL/uL (ref 3.87–5.11)
RDW: 14.7 % (ref 11.5–15.5)
WBC: 4.9 10*3/uL (ref 4.0–10.5)

## 2013-05-14 LAB — BASIC METABOLIC PANEL WITH GFR
BUN: 10 mg/dL (ref 6–23)
CHLORIDE: 101 meq/L (ref 96–112)
CO2: 30 meq/L (ref 19–32)
CREATININE: 0.8 mg/dL (ref 0.50–1.10)
Calcium: 9.7 mg/dL (ref 8.4–10.5)
GFR, EST NON AFRICAN AMERICAN: 76 mL/min
GFR, Est African American: 88 mL/min
Glucose, Bld: 119 mg/dL — ABNORMAL HIGH (ref 70–99)
POTASSIUM: 3.6 meq/L (ref 3.5–5.3)
Sodium: 141 mEq/L (ref 135–145)

## 2013-05-14 LAB — HEPATIC FUNCTION PANEL
ALT: 28 U/L (ref 0–35)
AST: 28 U/L (ref 0–37)
Albumin: 4.1 g/dL (ref 3.5–5.2)
Alkaline Phosphatase: 70 U/L (ref 39–117)
BILIRUBIN INDIRECT: 0.5 mg/dL (ref 0.2–1.2)
Bilirubin, Direct: 0.2 mg/dL (ref 0.0–0.3)
Total Bilirubin: 0.7 mg/dL (ref 0.2–1.2)
Total Protein: 7.1 g/dL (ref 6.0–8.3)

## 2013-05-14 LAB — HEMOGLOBIN A1C
Hgb A1c MFr Bld: 6.3 % — ABNORMAL HIGH (ref <5.7)
Mean Plasma Glucose: 134 mg/dL — ABNORMAL HIGH (ref <117)

## 2013-05-14 MED ORDER — METHOCARBAMOL 500 MG PO TABS: 500.0000 mg | ORAL_TABLET | Freq: Three times a day (TID) | ORAL | Status: DC

## 2013-05-14 MED ORDER — AZITHROMYCIN 250 MG PO TABS: ORAL_TABLET | ORAL | Status: AC

## 2013-05-14 MED ORDER — DEXAMETHASONE SODIUM PHOSPHATE 10 MG/ML IJ SOLN
10.0000 mg | Freq: Once | INTRAMUSCULAR | Status: AC
  Administered 2013-05-14: 10 mg via INTRAMUSCULAR

## 2013-05-14 NOTE — Patient Instructions (Signed)
Sinusitis Sinusitis is redness, soreness, and puffiness (inflammation) of the air pockets in the bones of your face (sinuses). The redness, soreness, and puffiness can cause air and mucus to get trapped in your sinuses. This can allow germs to grow and cause an infection.  HOME CARE   Drink enough fluids to keep your pee (urine) clear or pale yellow.  Use a humidifier in your home.  Run a hot shower to create steam in the bathroom. Sit in the bathroom with the door closed. Breathe in the steam 3 4 times a day.  Put a warm, moist washcloth on your face 3 4 times a day, or as told by your doctor.  Use salt water sprays (saline sprays) to wet the thick fluid in your nose. This can help the sinuses drain.  Only take medicine as told by your doctor. GET HELP RIGHT AWAY IF:   Your pain gets worse.  You have very bad headaches.  You are sick to your stomach (nauseous).  You throw up (vomit).  You are very sleepy (drowsy) all the time.  Your face is puffy (swollen).  Your vision changes.  You have a stiff neck.  You have trouble breathing. MAKE SURE YOU:   Understand these instructions.  Will watch your condition.  Will get help right away if you are not doing well or get worse. Document Released: 06/21/2007 Document Revised: 09/27/2011 Document Reviewed: 08/08/2011 [employer] Patient Information 2014 [employer].  

## 2013-05-14 NOTE — Progress Notes (Signed)
Subjective:    Patient ID: Kaitlyn Townsend, female    DOB: 1945/06/26, 68 y.o.   MRN: [id number]  HPI Comments: 68 yo female presents for 3 month F/U for HTN, Cholesterol, Pre-Dm, D. Deficient. She is eating healthier. She is exercising more with yard work. She notes yard work has been making her a little sore, she notes old muscle relaxers help and only takes occasionally. She notes BP good at home and denies Hypotension symptoms.  She notes increased congestion over last couple of weeks with sinus. She denies production. She is taking Sudafed w/o relief.   CHOL         161   02/13/2013 HDL           48   02/13/2013 LDLCALC       73   02/13/2013 TRIG         200   02/13/2013 CHOLHDL      3.4   02/13/2013 ALT           21   02/13/2013 AST           21   02/13/2013 ALKPHOS       78   02/13/2013 BILITOT      0.5   02/13/2013 CREATININE     0.83   02/13/2013 BUN              12   02/13/2013 NA              140   02/13/2013 K               3.9   02/13/2013 CL              103   02/13/2013 CO2              30   02/13/2013 WBC      4.7   02/13/2013 HGB     14.0   02/13/2013 HCT     41.7   02/13/2013 MCV     84.9   02/13/2013 PLT      301   02/13/2013 HGBA1C      6.3   02/13/2013   Hyperlipidemia Associated symptoms include myalgias.  Hypertension      Medication List       This list is accurate as of: 05/14/13 10:19 AM.  Always use your most recent med list.               aspirin 81 MG chewable tablet  Chew by mouth daily.     atorvastatin 80 MG tablet  Commonly known as:  LIPITOR  Take 1 tablet (80 mg total) by mouth daily.     enalapril 20 MG tablet  Commonly known as:  VASOTEC  Take 20 mg by mouth daily.     estradiol 1 MG tablet  Commonly known as:  ESTRACE  Take 1 mg by mouth daily.     FISH OIL PO  Take by mouth daily.     FREESTYLE LITE test strip  Generic drug:  glucose blood  TEST ONCE DAILY     hydrochlorothiazide 25 MG tablet  Commonly known as:  HYDRODIURIL   Take 25 mg by mouth daily.     MAGNESIUM PO  Take 800 mg by mouth daily.     multivitamin with minerals tablet  Take 1 tablet by mouth daily.     VITAMIN D PO  Take 5,000 Int'l Units by mouth daily.  No Known Allergies  Past Medical History  Diagnosis Date  . Hyperlipidemia   . Hypertension   . Prediabetes   . Vitamin D deficiency   . Allergy   . GERD (gastroesophageal reflux disease)      Review of Systems  HENT: Positive for congestion and postnasal drip.   Musculoskeletal: Positive for myalgias.  All other systems reviewed and are negative.  BP 100/58  Pulse 78  Temp(Src) 98 F (36.7 C) (Temporal)  Resp 18  Ht 5' 4.75" (1.645 m)  Wt 164 lb (74.39 kg)  BMI 27.49 kg/m2     Objective:   Physical Exam  Nursing note and vitals reviewed. Constitutional: She is oriented to person, place, and time. She appears well-developed and well-nourished. No distress.  HENT:  Head: Normocephalic and atraumatic.  Right Ear: External ear normal.  Left Ear: External ear normal.  Nose: Nose normal.  Mouth/Throat: Oropharynx is clear and moist.  Cloudy TM's bilaterally   Eyes: Conjunctivae and EOM are normal.  Neck: Normal range of motion. Neck supple. No JVD present. No thyromegaly present.  Cardiovascular: Normal rate, regular rhythm, normal heart sounds and intact distal pulses.   Pulmonary/Chest: Effort normal and breath sounds normal.  Abdominal: Soft. Bowel sounds are normal. She exhibits no distension and no mass. There is no tenderness. There is no rebound and no guarding.  Musculoskeletal: Normal range of motion. She exhibits no edema and no tenderness.  Lymphadenopathy:    She has no cervical adenopathy.  Neurological: She is alert and oriented to person, place, and time. No cranial nerve deficit.  Skin: Skin is warm and dry. No rash noted. No erythema. No pallor.  Psychiatric: She has a normal mood and affect. Her behavior is normal. Judgment and thought  content normal.          Assessment & Plan:  1.  3 month F/U for HTN, Cholesterol, Pre-Dm, D. Deficient. Needs healthy diet, cardio QD and obtain healthy weight. Check Labs, Check BP if >130/80 call office   2. Muscle tightness- Robaxin 500 PRN, increase H2o  3. Allergic rhinitis- Allegra OTC, increase H2o, allergy hygiene explained.  Zpak if SX increase, try OTC Flonase

## 2013-05-26 ENCOUNTER — Other Ambulatory Visit: Payer: Self-pay | Admitting: Internal Medicine

## 2013-08-14 ENCOUNTER — Other Ambulatory Visit: Payer: Self-pay | Admitting: Internal Medicine

## 2013-08-18 ENCOUNTER — Encounter: Payer: Self-pay | Admitting: Internal Medicine

## 2013-08-18 ENCOUNTER — Ambulatory Visit (INDEPENDENT_AMBULATORY_CARE_PROVIDER_SITE_OTHER): Payer: Medicare Other | Admitting: Internal Medicine

## 2013-08-18 VITALS — BP 122/76 | HR 76 | Temp 97.7°F | Resp 16 | Ht 64.75 in | Wt 171.0 lb

## 2013-08-18 DIAGNOSIS — Z789 Other specified health status: Secondary | ICD-10-CM

## 2013-08-18 DIAGNOSIS — E785 Hyperlipidemia, unspecified: Secondary | ICD-10-CM

## 2013-08-18 DIAGNOSIS — Z79899 Other long term (current) drug therapy: Secondary | ICD-10-CM

## 2013-08-18 DIAGNOSIS — I10 Essential (primary) hypertension: Secondary | ICD-10-CM

## 2013-08-18 DIAGNOSIS — Z1212 Encounter for screening for malignant neoplasm of rectum: Secondary | ICD-10-CM

## 2013-08-18 DIAGNOSIS — E559 Vitamin D deficiency, unspecified: Secondary | ICD-10-CM

## 2013-08-18 DIAGNOSIS — Z0000 Encounter for general adult medical examination without abnormal findings: Secondary | ICD-10-CM

## 2013-08-18 DIAGNOSIS — R7303 Prediabetes: Secondary | ICD-10-CM

## 2013-08-18 DIAGNOSIS — Z1331 Encounter for screening for depression: Secondary | ICD-10-CM

## 2013-08-18 LAB — CBC WITH DIFFERENTIAL/PLATELET
BASOS ABS: 0 10*3/uL (ref 0.0–0.1)
Basophils Relative: 1 % (ref 0–1)
Eosinophils Absolute: 0.1 10*3/uL (ref 0.0–0.7)
Eosinophils Relative: 3 % (ref 0–5)
HEMATOCRIT: 36.9 % (ref 36.0–46.0)
HEMOGLOBIN: 13 g/dL (ref 12.0–15.0)
LYMPHS ABS: 1.7 10*3/uL (ref 0.7–4.0)
LYMPHS PCT: 37 % (ref 12–46)
MCH: 28.8 pg (ref 26.0–34.0)
MCHC: 35.2 g/dL (ref 30.0–36.0)
MCV: 81.6 fL (ref 78.0–100.0)
MONO ABS: 0.4 10*3/uL (ref 0.1–1.0)
MONOS PCT: 9 % (ref 3–12)
Neutro Abs: 2.3 10*3/uL (ref 1.7–7.7)
Neutrophils Relative %: 50 % (ref 43–77)
Platelets: 258 10*3/uL (ref 150–400)
RBC: 4.52 MIL/uL (ref 3.87–5.11)
RDW: 14.5 % (ref 11.5–15.5)
WBC: 4.5 10*3/uL (ref 4.0–10.5)

## 2013-08-18 LAB — HEPATIC FUNCTION PANEL
ALT: 27 U/L (ref 0–35)
AST: 26 U/L (ref 0–37)
Albumin: 3.7 g/dL (ref 3.5–5.2)
Alkaline Phosphatase: 73 U/L (ref 39–117)
Bilirubin, Direct: 0.1 mg/dL (ref 0.0–0.3)
Indirect Bilirubin: 0.3 mg/dL (ref 0.2–1.2)
Total Bilirubin: 0.4 mg/dL (ref 0.2–1.2)
Total Protein: 6.3 g/dL (ref 6.0–8.3)

## 2013-08-18 LAB — BASIC METABOLIC PANEL WITH GFR
BUN: 10 mg/dL (ref 6–23)
CO2: 24 meq/L (ref 19–32)
Calcium: 8.7 mg/dL (ref 8.4–10.5)
Chloride: 106 mEq/L (ref 96–112)
Creat: 0.74 mg/dL (ref 0.50–1.10)
GFR, EST NON AFRICAN AMERICAN: 84 mL/min
GFR, Est African American: >89 mL/min
Glucose, Bld: 115 mg/dL — ABNORMAL HIGH (ref 70–99)
POTASSIUM: 3.8 meq/L (ref 3.5–5.3)
Sodium: 140 mEq/L (ref 135–145)

## 2013-08-18 LAB — LIPID PANEL
Cholesterol: 125 mg/dL (ref 0–200)
HDL: 34 mg/dL — AB (ref >39)
LDL CALC: 36 mg/dL (ref 0–99)
TRIGLYCERIDES: 275 mg/dL — AB (ref <150)
Total CHOL/HDL Ratio: 3.7 Ratio
VLDL: 55 mg/dL — ABNORMAL HIGH (ref 0–40)

## 2013-08-18 LAB — HEMOGLOBIN A1C
Hgb A1c MFr Bld: 6.3 % — ABNORMAL HIGH (ref <5.7)
MEAN PLASMA GLUCOSE: 134 mg/dL — AB (ref <117)

## 2013-08-18 LAB — MAGNESIUM: Magnesium: 1.8 mg/dL (ref 1.5–2.5)

## 2013-08-18 LAB — TSH: TSH: 2.382 u[IU]/mL (ref 0.350–4.500)

## 2013-08-18 NOTE — Patient Instructions (Signed)

## 2013-08-18 NOTE — Progress Notes (Signed)
Patient ID: Kaitlyn Townsend, female   DOB: [birth year], 68 y.o.   MRN: [id number]   Annual Screening Comprehensive Examination  This very nice 68 y.o.M presents for complete physical.  Patient has been followed for HTN, Prediabetes, Hyperlipidemia, and Vitamin D Deficiency.    HTN predates since 1999. Patient's BP has been controlled at home and patient denies any cardiac symptoms as chest pain, palpitations, shortness of breath, dizziness or ankle swelling. Today's BP: 122/76 mmHg.    Patient's hyperlipidemia is controlled with diet and medications. Patient denies myalgias or other medication SE's. Last lipids were 05/14/2013: Cholesterol, Total 152; HDL Cholesterol by NMR 44; LDL (calc) 74; Triglycerides 168* .  Patient has prediabetes predating since 2011 with A1c 6.0% and she has managed this with diet and patient denies reactive hypoglycemic symptoms, visual blurring, diabetic polys, or paresthesias. Last A1c was 05/14/2013: Hemoglobin-A1c 6.3*   Finally, patient has history of Vitamin D Deficiency 43 in 2008 and last Vitamin D was 81 in Jan 2015.  Medication Sig  . aspirin 81 MG chewable tablet Chew by mouth daily.  Marland Kitchen atorvastatin  80 MG tablet Take 1 tablet (80 mg total) by mouth daily.  Marland Kitchen VITAMIN D  Take 5,000 Int'l Units by mouth daily.  . enalapril  20 MG tablet TAKE ONE TABLET  EVERY DAY  . estradiol (ESTRACE) 1 MG tablet Take 1 mg by mouth daily.  Marland Kitchen FREESTYLE LITE test strip TEST ONCE DAILY  . hctz 25 MG  TAKE 1 tab daily  . MAGNESIUM PO Take 800 mg by mouth daily.   Marland Kitchen ROBAXIN 500 MG tablet Take 1 tablet  by mouth 3  times daily.  . Multiple Vitamins-Minerals  Take 1 tablet by mouth daily.  . Omega-3 Fatty Acids (FISH OIL PO) Take by mouth daily.   No Known Allergies  Past Medical History  Diagnosis Date  . Hyperlipidemia   . Hypertension   . Prediabetes   . Vitamin D deficiency   . Allergy   . GERD (gastroesophageal reflux disease)    Past Surgical History  Procedure  Laterality Date  . Knee surgery Left 09/2009  . Abdominal hysterectomy Bilateral 1977    bso   Family History  Problem Relation Age of Onset  . Heart disease Mother   . Ulcers Father   . Heart disease Brother   . Hypertension Brother   . Kidney disease Brother    History  Substance Use Topics  . Smoking status: Former Smoker    Types: Cigarettes    Quit date: 01/16/1994  . Smokeless tobacco: Not on file  . Alcohol Use: Yes     Comment: ocassional    ROS Constitutional: Denies fever, chills, weight loss/gain, headaches, insomnia, fatigue, night sweats, and change in appetite. Eyes: Denies redness, blurred vision, diplopia, discharge, itchy, watery eyes.  ENT: Denies discharge, congestion, post nasal drip, epistaxis, sore throat, earache, hearing loss, dental pain, Tinnitus, Vertigo, Sinus pain, snoring.  Cardio: Denies chest pain, palpitations, irregular heartbeat, syncope, dyspnea, diaphoresis, orthopnea, PND, claudication, edema Respiratory: denies cough, dyspnea, DOE, pleurisy, hoarseness, laryngitis, wheezing.  Gastrointestinal: Denies dysphagia, heartburn, reflux, water brash, pain, cramps, nausea, vomiting, bloating, diarrhea, constipation, hematemesis, melena, hematochezia, jaundice, hemorrhoids Genitourinary: Denies dysuria, frequency, urgency, nocturia, hesitancy, discharge, hematuria, flank pain Breast: Breast lumps, nipple discharge, bleeding.  Musculoskeletal: Denies arthralgia, myalgia, stiffness, Jt. Swelling, pain, limp, and strain/sprain. Denies falls. Skin: Denies puritis, rash, hives, warts, acne, eczema, changing in skin lesion Neuro: No weakness, tremor, incoordination, spasms,  paresthesia, pain Psychiatric: Denies confusion, memory loss, sensory loss. Denies Depression. Endocrine: Denies change in weight, skin, hair change, nocturia, and paresthesia, diabetic polys, visual blurring, hyper / hypo glycemic episodes.  Heme/Lymph: No excessive bleeding, bruising,  enlarged lymph nodes.  Physical Exam  BP 122/76  Pulse 76  Temp(Src) 97.7 F (36.5 C) (Temporal)  Resp 16  Ht 5' 4.75" (1.645 m)  Wt 171 lb (77.565 kg)  BMI 28.66 kg/m2  General Appearance: Well nourished and in no apparent distress. Eyes: PERRLA, EOMs, conjunctiva no swelling or erythema, normal fundi and vessels. Sinuses: No frontal/maxillary tenderness ENT/Mouth: EACs patent / TMs  nl. Nares clear without erythema, swelling, mucoid exudates. Oral hygiene is good. No erythema, swelling, or exudate. Tongue normal, non-obstructing. Tonsils not swollen or erythematous. Hearing normal.  Neck: Supple, thyroid normal. No bruits, nodes or JVD. Respiratory: Respiratory effort normal.  BS equal and clear bilateral without rales, rhonci, wheezing or stridor. Cardio: Heart sounds are normal with regular rate and rhythm and no murmurs, rubs or gallops. Peripheral pulses are normal and equal bilaterally without edema. No aortic or femoral bruits. Chest: symmetric with normal excursions and percussion. Breasts: Symmetric, without lumps, nipple discharge, retractions, or fibrocystic changes.  Abdomen: Flat, soft, with bowl sounds. Nontender, no guarding, rebound, hernias, masses, or organomegaly.  Lymphatics: Non tender without lymphadenopathy.  Genitourinary:  Musculoskeletal: Full ROM all peripheral extremities, joint stability, 5/5 strength, and normal gait. Skin: Warm and dry without rashes, lesions, cyanosis, clubbing or  ecchymosis.  Neuro: Cranial nerves intact, reflexes equal bilaterally. Normal muscle tone, no cerebellar symptoms. Sensation intact.  Pysch: Awake and oriented X 3, normal affect, Insight and Judgment appropriate.   Assessment and Plan  1. Annual Screening Examination 2. Hypertension  3. Hyperlipidemia 4. Pre Diabetes 5. Vitamin D Deficiency  Continue prudent diet as discussed, weight control, BP monitoring, regular exercise, and medications. Discussed med's effects  and SE's. Screening labs and tests as requested with regular follow-up as recommended.

## 2013-08-19 ENCOUNTER — Other Ambulatory Visit: Payer: Self-pay | Admitting: Internal Medicine

## 2013-08-19 DIAGNOSIS — Z1231 Encounter for screening mammogram for malignant neoplasm of breast: Secondary | ICD-10-CM

## 2013-08-19 LAB — URINALYSIS, MICROSCOPIC ONLY
Casts: NONE SEEN
Crystals: NONE SEEN

## 2013-08-19 LAB — MICROALBUMIN / CREATININE URINE RATIO
Creatinine, Urine: 124.5 mg/dL
Microalb Creat Ratio: 5 mg/g (ref 0.0–30.0)
Microalb, Ur: 0.62 mg/dL (ref 0.00–1.89)

## 2013-08-19 LAB — INSULIN, FASTING: Insulin fasting, serum: 23 u[IU]/mL (ref 3–28)

## 2013-08-19 LAB — VITAMIN D 25 HYDROXY (VIT D DEFICIENCY, FRACTURES): Vit D, 25-Hydroxy: 63 ng/mL (ref 30–89)

## 2013-08-27 ENCOUNTER — Other Ambulatory Visit: Payer: Self-pay | Admitting: *Deleted

## 2013-08-27 DIAGNOSIS — Z1212 Encounter for screening for malignant neoplasm of rectum: Secondary | ICD-10-CM

## 2013-08-27 LAB — POC HEMOCCULT BLD/STL (HOME/3-CARD/SCREEN)
Card #2 Fecal Occult Blod, POC: NEGATIVE
FECAL OCCULT BLD: NEGATIVE
Fecal Occult Blood, POC: NEGATIVE

## 2013-09-09 ENCOUNTER — Ambulatory Visit (HOSPITAL_COMMUNITY)
Admission: RE | Admit: 2013-09-09 | Discharge: 2013-09-09 | Disposition: A | Discharge status: Home / Self Care | Payer: Medicare Other | Source: Ambulatory Visit | Attending: Internal Medicine | Admitting: Internal Medicine

## 2013-09-09 DIAGNOSIS — Z1231 Encounter for screening mammogram for malignant neoplasm of breast: Secondary | ICD-10-CM | POA: Insufficient documentation

## 2013-10-21 ENCOUNTER — Other Ambulatory Visit: Payer: Self-pay | Admitting: Physician Assistant

## 2013-10-21 ENCOUNTER — Other Ambulatory Visit: Payer: Self-pay | Admitting: Internal Medicine

## 2013-11-24 ENCOUNTER — Other Ambulatory Visit: Payer: Self-pay | Admitting: Internal Medicine

## 2013-11-26 ENCOUNTER — Ambulatory Visit: Payer: Self-pay | Admitting: Physician Assistant

## 2014-02-26 ENCOUNTER — Other Ambulatory Visit: Payer: Self-pay | Admitting: *Deleted

## 2014-02-26 MED ORDER — HYDROCHLOROTHIAZIDE 25 MG PO TABS: ORAL_TABLET | ORAL | Status: DC

## 2014-03-08 NOTE — Patient Instructions (Signed)

## 2014-03-08 NOTE — Progress Notes (Signed)
Patient ID: Kaitlyn Townsend, female   DOB: 1945-01-30, 69 y.o.   MRN: [id number]  MEDICARE ANNUAL WELLNESS VISIT AND   OV  Assessment:   1. Essential hypertension  - TSH  2. Hyperlipidemia  - Lipid panel  3. Prediabetes  - Hemoglobin A1c - Insulin, fasting  4. Vitamin D deficiency  - Vit D  25 hydroxy (rtn osteoporosis monitoring)  5. Depression screen  -Screen Negative  6. At low risk for fall   7. Medication management  - CBC with Differential/Platelet - BASIC METABOLIC PANEL WITH GFR - Hepatic function panel - Magnesium  8. Routine general medical examination at a health care facility   9. Need for prophylactic vaccination against Streptococcus pneumoniae (pneumococcus)  - Pneumococcal conjugate vaccine 13-valent   Plan:   During the course of the visit the patient was educated and counseled about appropriate screening and preventive services including:    Pneumococcal vaccine   Influenza vaccine  Td vaccine  Screening electrocardiogram  Bone densitometry screening  Colorectal cancer screening  Diabetes screening  Glaucoma screening  Nutrition counseling   Advanced directives: requested  Screening recommendations, referrals: Vaccinations:  Immunization History  Administered Date(s) Administered  . Influenza Split 10/31/2012  . Pneumococcal Conjugate-13 03/09/2014  . Td 08/09/2012  . Zoster 08/14/2012  Pneumococcal vaccine 09/16/2009 Hep B vaccine not indicated  Nutrition assessed and recommended  Colonoscopy 02/01/2010 Recommended yearly ophthalmology/optometry visit for glaucoma screening and checkup Recommended yearly dental visit for hygiene and checkup Advanced directives - yes  Conditions/risks identified: BMI: Discussed weight loss, diet, and increase physical activity.  Increase physical activity: AHA recommends 150 minutes of physical activity a week.  Medications reviewed PreDiabetes is near goal, ACE/ARB therapy:  Yes. Urinary Incontinence is not an issue: discussed non pharmacology and pharmacology options.  Fall risk: low- discussed PT, home fall assessment, medications.   Subjective:   Kaitlyn Townsend  presents for [employer] Visit and OV.  Date of last medicare wellness visit is unknown. This very nice 69 y.o. MBF presents for 3 month follow up with Hypertension, Hyperlipidemia, Pre-Diabetes and Vitamin D Deficiency.    Patient is treated for HTN since 1995 & BP has been controlled at home. Today's BP: 120/74 mmHg. Patient has had no complaints of any cardiac type chest pain, palpitations, dyspnea/orthopnea/PND, dizziness, claudication, or dependent edema.   Hperlipidemia is controlled with diet & meds. Patient denies myalgias or other med SE's. Last Lipids were at goal -  Total Cholesterol,  125; HDL 34; LDL  36;  with elevated Trig 275 on 08/18/2013.   Also, the patient has history of PreDiabetes since 2011 with an A1c of 6/2% and has had no symptoms of reactive hypoglycemia, diabetic polys, paresthesias or visual blurring.  Last A1c was  6.3% on  08/18/2013.   Further, the patient also has history of Vitamin D Deficiency of 43 on supplements in 2008 and supplements vitamin D without any suspected side-effects. Last vitamin D was  63 on 08/18/2013.    Names of Other Physician/Practitioners you currently use: 1. [facility] here for primary care 2. [facility] , last visit 2015 3. Dr Claudina Lick, DDS, dentist, last visit Sept 2015 and q57mo  Patient Care Team: Unk Pinto, MD as PCP - General (Internal Medicine) Inda Castle, MD as Consulting Physician (Gastroenterology)  Medication Review: Medication Sig  . aspirin 81 MG chewable tablet Chew by mouth daily.  Marland Kitchen atorvastatin (LIPITOR) 80 MG tablet  TAKE ONE TABLET BY MOUTH ONCE DAILY  . Cholecalciferol (VITAMIN D PO) Take 5,000 Int'l Units by mouth daily.  . enalapril (VASOTEC) 20  MG tablet TAKE ONE TABLET BY MOUTH EVERY DAY  . estradiol (ESTRACE) 1 MG tablet TAKE ONE TABLET BY MOUTH ONCE DAILY  . hydrochlorothiazide (HYDRODIURIL) 25 MG tablet TAKE ONE TABLET BY MOUTH ONCE DAILY FOR BLOOD PRESSURE AND  FLUID  . MAGNESIUM PO Take 800 mg by mouth daily.   . methocarbamol (ROBAXIN) 500 MG tablet Take 1 tablet (500 mg total) by mouth 3 (three) times daily.  . Multiple Vitamins-Minerals (MULTIVITAMIN WITH MINERALS) tablet Take 1 tablet by mouth daily.  . Omega-3 Fatty Acids (FISH OIL PO) Take by mouth daily.  Marland Kitchen ZOSTAVAX 27253 UNT/0.65ML injection    Current Problems (verified) Patient Active Problem List   Diagnosis Date Noted  . Medication management 02/13/2013  . Hyperlipidemia   . Hypertension   . Prediabetes   . Vitamin D deficiency   . GERD (gastroesophageal reflux disease)    Screening Tests Health Maintenance  Topic Date Due  . COLONOSCOPY  09/01/1994  . PNEUMOCOCCAL POLYSACCHARIDE VACCINE AGE 54 AND OVER  08/31/2009  . INFLUENZA VACCINE  08/16/2013  . MAMMOGRAM  09/10/2015  . TETANUS/TDAP  08/10/2022  . DEXA SCAN  Completed  . ZOSTAVAX  Completed   Immunization History  Administered Date(s) Administered  . Influenza Split 10/31/2012  . Pneumococcal Conjugate-13 03/09/2014  . Td 08/09/2012  . Zoster 08/14/2012    Preventative care: Last colonoscopy: 02/01/2010 Dr Kaitlyn Townsend  History reviewed: allergies, current medications, past family history, past medical history, past social history, past surgical history and problem list  Risk Factors: Tobacco History  Substance Use Topics  . Smoking status: Former Smoker    Types: Cigarettes    Quit date: 01/16/1994  . Smokeless tobacco: Not on file  . Alcohol Use: Yes     Comment: ocassional   She does not smoke.  Patient is a former smoker. Are there smokers in your home (other than you)?  No Alcohol Current alcohol use: rare  Caffeine Current caffeine use: denies use  Exercise Current  exercise: housecleaning and walking  Nutrition/Diet Current diet: in general, a "healthy" diet    Cardiac risk factors: advanced age (older than 4 for men, 25 for women), dyslipidemia, hypertension, sedentary lifestyle and smoking/ tobacco exposure.  Depression Screen (Note: if answer to either of the following is "Yes", a more complete depression screening is indicated)   Q1: Over the past two weeks, have you felt down, depressed or hopeless? No  Q2: Over the past two weeks, have you felt little interest or pleasure in doing things? No  Have you lost interest or pleasure in daily life? No  Do you often feel hopeless? No  Do you cry easily over simple problems? No  Activities of Daily Living In your present state of health, do you have any difficulty performing the following activities?:  Driving? No Managing money?  No Feeding yourself? No Getting from bed to chair? No Climbing a flight of stairs? No Preparing food and eating?: No Bathing or showering? No Getting dressed: No Getting to the toilet? No Using the toilet:No Moving around from place to place: No In the past year have you fallen or had a near fall?:No   Are you sexually active?  Yes  Do you have more than one partner?  No  Vision Difficulties: No  Hearing Difficulties: No Do you often ask people  to speak up or repeat themselves? No Do you experience ringing or noises in your ears? No Do you have difficulty understanding soft or whispered voices? Sometimes.  Cognition  Do you feel that you have a problem with memory?No  Do you often misplace items? No  Do you feel safe at home?  Yes  Advanced directives Does patient have a [place]? Yes Does patient have a Living Will? Yes  Past Medical History  Diagnosis Date  . Hyperlipidemia   . Hypertension   . Prediabetes   . Vitamin D deficiency   . Allergy   . GERD (gastroesophageal reflux disease)    Past Surgical History  Procedure  Laterality Date  . Knee surgery Left 09/2009  . Abdominal hysterectomy Bilateral 1977    bso    ROS: Constitutional: Denies fever, chills, weight loss/gain, headaches, insomnia, fatigue, night sweats, and change in appetite. Eyes: Denies redness, blurred vision, diplopia, discharge, itchy, watery eyes.  ENT: Denies discharge, congestion, post nasal drip, epistaxis, sore throat, earache, hearing loss, dental pain, Tinnitus, Vertigo, Sinus pain, snoring.  Cardio: Denies chest pain, palpitations, irregular heartbeat, syncope, dyspnea, diaphoresis, orthopnea, PND, claudication, edema Respiratory: denies cough, dyspnea, DOE, pleurisy, hoarseness, laryngitis, wheezing.  Gastrointestinal: Denies dysphagia, heartburn, reflux, water brash, pain, cramps, nausea, vomiting, bloating, diarrhea, constipation, hematemesis, melena, hematochezia, jaundice, hemorrhoids Genitourinary: Denies dysuria, frequency, urgency, nocturia, hesitancy, discharge, hematuria, flank pain Breast: Breast lumps, nipple discharge, bleeding.  Musculoskeletal: Denies arthralgia, myalgia, stiffness, Jt. Swelling, pain, limp, and strain/sprain. Denies falls. Skin: Denies puritis, rash, hives, warts, acne, eczema, changing in skin lesion Neuro: No weakness, tremor, incoordination, spasms, paresthesia, pain Psychiatric: Denies confusion, memory loss, sensory loss. Denies Depression. Endocrine: Denies change in weight, skin, hair change, nocturia, and paresthesia, diabetic polys, visual blurring, hyper / hypo glycemic episodes.  Heme/Lymph: No excessive bleeding, bruising, enlarged lymph nodes  Objective:     BP 120/74   Pulse 60  Temp 97.6 F   Resp 16  Ht 5' 4.75"   Wt 173 lb     BMI 29.00  General Appearance: Well nourished, alert, WD/WN, femaleand in no apparent distress. Eyes: PERRLA, EOMs, conjunctiva no swelling or erythema, normal fundi and vessels. Sinuses: No frontal/maxillary tenderness ENT/Mouth: EACs patent / TMs   nl. Nares clear without erythema, swelling, mucoid exudates. Oral hygiene is good. No erythema, swelling, or exudate. Tongue normal, non-obstructing. Tonsils not swollen or erythematous. Hearing normal.  Neck: Supple, thyroid normal. No bruits, nodes or JVD. Respiratory: Respiratory effort normal.  BS equal and clear bilateral without rales, rhonci, wheezing or stridor. Cardio: Heart sounds are normal with regular rate and rhythm and no murmurs, rubs or gallops. Peripheral pulses are normal and equal bilaterally without edema. No aortic or femoral bruits. Chest: symmetric with normal excursions and percussion. Breasts: Symmetric, without lumps, nipple discharge, retractions, or fibrocystic changes.  Abdomen: Flat, soft  with nl bowel sounds. Nontender, no guarding, rebound, hernias, masses, or organomegaly.  Lymphatics: Non tender without lymphadenopathy.  Musculoskeletal: Full ROM all peripheral extremities, joint stability, 5/5 strength, and normal gait. Skin: Warm and dry without rashes, lesions, cyanosis, clubbing or  ecchymosis.  Neuro: Cranial nerves intact, reflexes equal bilaterally. Normal muscle tone, no cerebellar symptoms. Sensation intact.  Pysch: Awake and oriented X 3, normal affect, Insight and Judgment appropriate.   Cognitive Testing  Alert? Yes  Normal Appearance?Yes  Oriented to person? Yes  Place? Yes   Time? Yes  Recall of three objects?  Yes  Can  perform simple calculations? Yes  Displays appropriate judgment? Yes  Can read the correct time from a watch/clock?Yes  Medicare Attestation I have personally reviewed: The patient's medical and social history Their use of alcohol, tobacco or illicit drugs Their current medications and supplements The patient's functional ability including ADLs,fall risks, home safety risks, cognitive, and hearing and visual impairment Diet and physical activities Evidence for depression or mood disorders  The patient's weight, height,  BMI, and visual acuity have been recorded in the chart.  I have made referrals, counseling, and provided education to the patient based on review of the above and I have provided the patient with a written personalized care plan for preventive services.    Priscella Donna DAVID, MD   03/09/2014

## 2014-03-09 ENCOUNTER — Ambulatory Visit (INDEPENDENT_AMBULATORY_CARE_PROVIDER_SITE_OTHER): Payer: 59 | Admitting: Internal Medicine

## 2014-03-09 ENCOUNTER — Encounter: Payer: Self-pay | Admitting: Internal Medicine

## 2014-03-09 VITALS — BP 120/74 | HR 60 | Temp 97.6°F | Resp 16 | Ht 64.75 in | Wt 173.0 lb

## 2014-03-09 DIAGNOSIS — E559 Vitamin D deficiency, unspecified: Secondary | ICD-10-CM

## 2014-03-09 DIAGNOSIS — Z1331 Encounter for screening for depression: Secondary | ICD-10-CM

## 2014-03-09 DIAGNOSIS — Z23 Encounter for immunization: Secondary | ICD-10-CM

## 2014-03-09 DIAGNOSIS — Z9181 History of falling: Secondary | ICD-10-CM

## 2014-03-09 DIAGNOSIS — R7303 Prediabetes: Secondary | ICD-10-CM

## 2014-03-09 DIAGNOSIS — Z0000 Encounter for general adult medical examination without abnormal findings: Secondary | ICD-10-CM

## 2014-03-09 DIAGNOSIS — Z79899 Other long term (current) drug therapy: Secondary | ICD-10-CM

## 2014-03-09 DIAGNOSIS — E785 Hyperlipidemia, unspecified: Secondary | ICD-10-CM

## 2014-03-09 DIAGNOSIS — I10 Essential (primary) hypertension: Secondary | ICD-10-CM

## 2014-03-09 LAB — CBC WITH DIFFERENTIAL/PLATELET
Basophils Absolute: 0 10*3/uL (ref 0.0–0.1)
Basophils Relative: 1 % (ref 0–1)
EOS ABS: 0.1 10*3/uL (ref 0.0–0.7)
Eosinophils Relative: 3 % (ref 0–5)
HCT: 40.4 % (ref 36.0–46.0)
Hemoglobin: 13.5 g/dL (ref 12.0–15.0)
LYMPHS ABS: 1.7 10*3/uL (ref 0.7–4.0)
LYMPHS PCT: 39 % (ref 12–46)
MCH: 28.4 pg (ref 26.0–34.0)
MCHC: 33.4 g/dL (ref 30.0–36.0)
MCV: 84.9 fL (ref 78.0–100.0)
MPV: 9.6 fL (ref 8.6–12.4)
Monocytes Absolute: 0.5 10*3/uL (ref 0.1–1.0)
Monocytes Relative: 12 % (ref 3–12)
NEUTROS ABS: 2 10*3/uL (ref 1.7–7.7)
NEUTROS PCT: 45 % (ref 43–77)
Platelets: 280 10*3/uL (ref 150–400)
RBC: 4.76 MIL/uL (ref 3.87–5.11)
RDW: 14.6 % (ref 11.5–15.5)
WBC: 4.4 10*3/uL (ref 4.0–10.5)

## 2014-03-09 LAB — HEMOGLOBIN A1C
Hgb A1c MFr Bld: 6.6 % — ABNORMAL HIGH (ref <5.7)
Mean Plasma Glucose: 143 mg/dL — ABNORMAL HIGH (ref <117)

## 2014-03-10 LAB — BASIC METABOLIC PANEL WITH GFR
BUN: 12 mg/dL (ref 6–23)
CHLORIDE: 103 meq/L (ref 96–112)
CO2: 27 mEq/L (ref 19–32)
Calcium: 9.2 mg/dL (ref 8.4–10.5)
Creat: 0.78 mg/dL (ref 0.50–1.10)
GFR, Est Non African American: 78 mL/min
Glucose, Bld: 115 mg/dL — ABNORMAL HIGH (ref 70–99)
POTASSIUM: 3.9 meq/L (ref 3.5–5.3)
Sodium: 141 mEq/L (ref 135–145)

## 2014-03-10 LAB — LIPID PANEL
CHOLESTEROL: 148 mg/dL (ref 0–200)
HDL: 47 mg/dL (ref >=46)
LDL CALC: 67 mg/dL (ref 0–99)
Total CHOL/HDL Ratio: 3.1 Ratio
Triglycerides: 172 mg/dL — ABNORMAL HIGH (ref <150)
VLDL: 34 mg/dL (ref 0–40)

## 2014-03-10 LAB — HEPATIC FUNCTION PANEL
ALT: 29 U/L (ref 0–35)
AST: 25 U/L (ref 0–37)
Albumin: 3.8 g/dL (ref 3.5–5.2)
Alkaline Phosphatase: 70 U/L (ref 39–117)
BILIRUBIN DIRECT: 0.1 mg/dL (ref 0.0–0.3)
BILIRUBIN INDIRECT: 0.3 mg/dL (ref 0.2–1.2)
TOTAL PROTEIN: 6.7 g/dL (ref 6.0–8.3)
Total Bilirubin: 0.4 mg/dL (ref 0.2–1.2)

## 2014-03-10 LAB — MAGNESIUM: MAGNESIUM: 1.8 mg/dL (ref 1.5–2.5)

## 2014-03-10 LAB — TSH: TSH: 2.865 u[IU]/mL (ref 0.350–4.500)

## 2014-03-10 LAB — INSULIN, FASTING: INSULIN FASTING, SERUM: 8.8 u[IU]/mL (ref 2.0–19.6)

## 2014-03-10 LAB — VITAMIN D 25 HYDROXY (VIT D DEFICIENCY, FRACTURES): VIT D 25 HYDROXY: 69 ng/mL (ref 30–100)

## 2014-04-27 ENCOUNTER — Other Ambulatory Visit: Payer: Self-pay | Admitting: Physician Assistant

## 2014-05-21 ENCOUNTER — Other Ambulatory Visit: Payer: Self-pay | Admitting: *Deleted

## 2014-05-21 MED ORDER — GLUCOSE BLOOD VI STRP: ORAL_STRIP | Status: DC

## 2014-05-21 MED ORDER — ACCU-CHEK SOFT TOUCH LANCETS MISC: Status: AC

## 2014-05-21 MED ORDER — ACCU-CHEK AVIVA PLUS W/DEVICE KIT: PACK | Status: DC

## 2014-05-25 ENCOUNTER — Other Ambulatory Visit: Payer: Self-pay | Admitting: Internal Medicine

## 2014-06-24 ENCOUNTER — Ambulatory Visit (INDEPENDENT_AMBULATORY_CARE_PROVIDER_SITE_OTHER): Payer: Medicare Other | Admitting: Physician Assistant

## 2014-06-24 ENCOUNTER — Ambulatory Visit: Payer: Self-pay | Admitting: Physician Assistant

## 2014-06-24 ENCOUNTER — Other Ambulatory Visit: Payer: Self-pay

## 2014-06-24 ENCOUNTER — Encounter: Payer: Self-pay | Admitting: Physician Assistant

## 2014-06-24 VITALS — BP 102/62 | HR 76 | Temp 97.7°F | Resp 16 | Ht 64.75 in | Wt 169.0 lb

## 2014-06-24 DIAGNOSIS — E559 Vitamin D deficiency, unspecified: Secondary | ICD-10-CM

## 2014-06-24 DIAGNOSIS — Z79899 Other long term (current) drug therapy: Secondary | ICD-10-CM

## 2014-06-24 DIAGNOSIS — R7303 Prediabetes: Secondary | ICD-10-CM

## 2014-06-24 DIAGNOSIS — E785 Hyperlipidemia, unspecified: Secondary | ICD-10-CM

## 2014-06-24 DIAGNOSIS — I10 Essential (primary) hypertension: Secondary | ICD-10-CM

## 2014-06-24 DIAGNOSIS — R7309 Other abnormal glucose: Secondary | ICD-10-CM

## 2014-06-24 MED ORDER — TOPIRAMATE 25 MG PO TABS: ORAL_TABLET | ORAL | Status: DC

## 2014-06-24 NOTE — Patient Instructions (Addendum)
Try the topamax 25mg  1 at night, for 3-5 night, can increase to 2 at night, If it is not helping we will try the lyrica samples.   Diabetes is a very complicated disease...lets simplify it.  An easy way to look at it to understand the complications is if you think of the extra sugar floating in your blood stream as glass shards floating through your blood stream.    Diabetes affects your small vessels first: 1) The glass shards (sugar) scraps down the tiny blood vessels in your eyes and lead to diabetic retinopathy, the leading cause of blindness in the [place]. Diabetes is the leading cause of newly diagnosed adult (61 to 70 years of age) blindness in the [place].  2) The glass shards scratches down the tiny vessels of your legs leading to nerve damage called neuropathy and can lead to amputations of your feet. More than 60% of all non-traumatic amputations of lower limbs occur in people with diabetes.  3) Over time the small vessels in your brain are shredded and closed off, individually this does not cause any problems but over a long period of time many of the small vessels being blocked can lead to Vascular Dementia.   4) Your kidney's are a filter system and have a "net" that keeps certain things in the body and lets bad things out. Sugar shreds this net and leads to kidney damage and eventually failure. Decreasing the sugar that is destroying the net and certain blood pressure medications can help stop or decrease progression of kidney disease. Diabetes was the primary cause of kidney failure in 44 percent of all new cases in 2011.  5) Diabetes also destroys the small vessels in your penis that lead to erectile dysfunction. Eventually the vessels are so damaged that you may not be responsive to cialis or viagra.   Diabetes and your large vessels: Your larger vessels consist of your coronary arteries in your heart and the carotid vessels to your brain. Diabetes or even increased sugars put  you at 300% increased risk of heart attack and stroke and this is why.. The sugar scrapes down your large blood vessels and your body sees this as an internal injury and tries to repair itself. Just like you get a scab on your skin, your platelets will stick to the blood vessel wall trying to heal it. This is why we have diabetics on low dose aspirin daily, this prevents the platelets from sticking and can prevent plaque formation. In addition, your body takes cholesterol and tries to shove it into the open wound. This is why we want your LDL, or bad cholesterol, below 70.   The combination of platelets and cholesterol over 5-10 years forms plaque that can break off and cause a heart attack or stroke.   PLEASE REMEMBER:  Diabetes is preventable! Up to 23 percent of complications and morbidities among individuals with type 2 diabetes can be prevented, delayed, or effectively treated and minimized with regular visits to a health professional, appropriate monitoring and medication, and a healthy diet and lifestyle.  Before you even begin to attack a weight-loss plan, it pays to remember this: You are not fat. You have fat. Losing weight isn't about blame or shame; it's simply another achievement to accomplish. Dieting is like any other skill-you have to buckle down and work at it. As long as you act in a smart, reasonable way, you'll ultimately get where you want to be. Here are some weight loss  pearls for you.  1. It's Not a Diet. It's a Lifestyle Thinking of a diet as something you're on and suffering through only for the short term doesn't work. To shed weight and keep it off, you need to make permanent changes to the way you eat. It's OK to indulge occasionally, of course, but if you cut calories temporarily and then revert to your old way of eating, you'll gain back the weight quicker than you can say yo-yo. Use it to lose it. Research shows that one of the best predictors of long-term weight loss is  how many pounds you drop in the first month. For that reason, nutritionists often suggest being stricter for the first two weeks of your new eating strategy to build momentum. Cut out added sugar and alcohol and avoid unrefined carbs. After that, figure out how you can reincorporate them in a way that's healthy and maintainable.  2. There's a Right Way to Exercise Working out burns calories and fat and boosts your metabolism by building muscle. But those trying to lose weight are notorious for overestimating the number of calories they burn and underestimating the amount they take in. Unfortunately, your system is biologically programmed to hold on to extra pounds and that means when you start exercising, your body senses the deficit and ramps up its hunger signals. If you're not diligent, you'll eat everything you burn and then some. Use it to lose it. Cardio gets all the exercise glory, but strength and interval training are the real heroes. They help you build lean muscle, which in turn increases your metabolism and calorie-burning ability 3. Don't Overreact to Mild Hunger Some people have a hard time losing weight because of hunger anxiety. To them, being hungry is bad-something to be avoided at all costs-so they carry snacks with them and eat when they don't need to. Others eat because they're stressed out or bored. While you never want to get to the point of being ravenous (that's when bingeing is likely to happen), a hunger pang, a craving, or the fact that it's 3:00 p.m. should not send you racing for the vending machine or obsessing about the energy bar in your purse. Ideally, you should put off eating until your stomach is growling and it's difficult to concentrate.  Use it to lose it. When you feel the urge to eat, use the HALT method. Ask yourself, Am I really hungry? Or am I angry or anxious, lonely or bored, or tired? If you're still not certain, try the apple test. If you're truly hungry, an  apple should seem delicious; if it doesn't, something else is going on. Or you can try drinking water and making yourself busy, if you are still hungry try a healthy snack.  4. Not All Calories Are Created Equal The mechanics of weight loss are pretty simple: Take in fewer calories than you use for energy. But the kind of food you eat makes all the difference. Processed food that's high in saturated fat and refined starch or sugar can cause inflammation that disrupts the hormone signals that tell your brain you're full. The result: You eat a lot more.  Use it to lose it. Clean up your diet. Swap in whole, unprocessed foods, including vegetables, lean protein, and healthy fats that will fill you up and give you the biggest nutritional bang for your calorie buck. In a few weeks, as your brain starts receiving regular hunger and fullness signals once again, you'll notice that you feel  less hungry overall and naturally start cutting back on the amount you eat.  5. Protein, Produce, and Plant-Based Fats Are Your Weight-Loss Trinity Here's why eating the three Ps regularly will help you drop pounds. Protein fills you up. You need it to build lean muscle, which keeps your metabolism humming so that you can torch more fat. People in a weight-loss program who ate double the recommended daily allowance for protein (about 110 grams for a 150-pound woman) lost 70 percent of their weight from fat, while people who ate the RDA lost only about 40 percent, one study found. Produce is packed with filling fiber. "It's very difficult to consume too many calories if you're eating a lot of vegetables. Example: Three cups of broccoli is a lot of food, yet only 93 calories. (Fruit is another story. It can be easy to overeat and can contain a lot of calories from sugar, so be sure to monitor your intake.) Plant-based fats like olive oil and those in avocados and nuts are healthy and extra satiating.  Use it to lose it. Aim to  incorporate each of the three Ps into every meal and snack. People who eat protein throughout the day are able to keep weight off, according to a study in the [place] of Clinical Nutrition. In addition to meat, poultry and seafood, good sources are beans, lentils, eggs, tofu, and yogurt. As for fat, keep portion sizes in check by measuring out salad dressing, oil, and nut butters (shoot for one to two tablespoons). Finally, eat veggies or a little fruit at every meal. People who did that consumed 308 fewer calories but didn't feel any hungrier than when they didn't eat more produce.  7. How You Eat Is As Important As What You Eat In order for your brain to register that you're full, you need to focus on what you're eating. Sit down whenever you eat, preferably at a table. Turn off the TV or computer, put down your phone, and look at your food. Smell it. Chew slowly, and don't put another bite on your fork until you swallow. When women ate lunch this attentively, they consumed 30 percent less when snacking later than those who listened to an audiobook at lunchtime, according to a study in the [place] of Nutrition. 8. Weighing Yourself Really Works The scale provides the best evidence about whether your efforts are paying off. Seeing the numbers tick up or down or stagnate is motivation to keep going-or to rethink your approach. A 2015 study at [facility] found that daily weigh-ins helped people lose more weight, keep it off, and maintain that loss, even after two years. Use it to lose it. Step on the scale at the same time every day for the best results. If your weight shoots up several pounds from one weigh-in to the next, don't freak out. Eating a lot of salt the night before or having your period is the likely culprit. The number should return to normal in a day or two. It's a steady climb that you need to do something about. 9. Too Much Stress and Too Little Sleep Are Your  Enemies When you're tired and frazzled, your body cranks up the production of cortisol, the stress hormone that can cause carb cravings. Not getting enough sleep also boosts your levels of ghrelin, a hormone associated with hunger, while suppressing leptin, a hormone that signals fullness and satiety. People on a diet who slept only five and a half hours a  night for two weeks lost 55 percent less fat and were hungrier than those who slept eight and a half hours, according to a study in the [place]. Use it to lose it. Prioritize sleep, aiming for seven hours or more a night, which research shows helps lower stress. And make sure you're getting quality zzz's. If a snoring spouse or a fidgety cat wakes you up frequently throughout the night, you may end up getting the equivalent of just four hours of sleep, according to a study from [facility]. Keep pets out of the bedroom, and use a white-noise app to drown out snoring. 10. You Will Hit a plateau-And You Can Bust Through It As you slim down, your body releases much less leptin, the fullness hormone.  If you're not strength training, start right now. Building muscle can raise your metabolism to help you overcome a plateau. To keep your body challenged and burning calories, incorporate new moves and more intense intervals into your workouts or add another sweat session to your weekly routine. Alternatively, cut an extra 100 calories or so a day from your diet. Now that you've lost weight, your body simply doesn't need as much fuel.   Ways to cut 100 calories  1. Eat your eggs with hot sauce OR salsa instead of cheese.  Eggs are great for breakfast, but many people consider eggs and cheese to be BFFs. Instead of cheese-1 oz. of cheddar has 114 calories-top your eggs with hot sauce, which contains no calories and helps with satiety and metabolism. Salsa is also a great option!!  2. Top your toast, waffles or pancakes with  mashed berries instead of jelly or syrup. Half a cup of berries-fresh, frozen or thawed-has about 40 calories, compared with 2 tbsp. of maple syrup or jelly, which both have about 100 calories. The berries will also give you a good punch of fiber, which helps keep you full and satisfied and won't spike blood sugar quickly like the jelly or syrup. 3. Swap the non-fat latte for black coffee with a splash of half-and-half. Contrary to its name, that non-fat latte has 130 calories and a startling 19g of carbohydrates per 16 oz. serving. Replacing that 'light' drinkable dessert with a black coffee with a splash of half-and-half saves you more than 100 calories per 16 oz. serving. 4. Sprinkle salads with freeze-dried raspberries instead of dried cranberries. If you want a sweet addition to your nutritious salad, stay away from dried cranberries. They have a whopping 130 calories per  cup and 30g carbohydrates. Instead, sprinkle freeze-dried raspberries guilt-free and save more than 100 calories per  cup serving, adding 3g of belly-filling fiber. 5. Go for mustard in place of mayo on your sandwich. Mustard can add really nice flavor to any sandwich, and there are tons of varieties, from spicy to honey. A serving of mayo is 95 calories, versus 10 calories in a serving of mustard. 6. Choose a DIY salad dressing instead of the store-bought kind. Mix Dijon or whole grain mustard with low-fat Kefir or red wine vinegar and garlic. 7. Use hummus as a spread instead of a dip. Use hummus as a spread on a high-fiber cracker or tortilla with a sandwich and save on calories without sacrificing taste. 8. Pick just one salad "accessory." Salad isn't automatically a calorie winner. It's easy to over-accessorize with toppings. Instead of topping your salad with nuts, avocado and cranberries (all three will clock in at 313 calories), just pick  one. The next day, choose a different accessory, which will also keep your salad  interesting. You don't wear all your jewelry every day, right? 9. Ditch the white pasta in favor of spaghetti squash. One cup of cooked spaghetti squash has about 40 calories, compared with traditional spaghetti, which comes with more than 200. Spaghetti squash is also nutrient-dense. It's a good source of fiber and Vitamins A and C, and it can be eaten just like you would eat pasta-with a great tomato sauce and [place] meatballs or with pesto, tofu and spinach, for example. 10. Dress up your chili, soups and stews with non-fat [place] yogurt instead of sour cream. Just a 'dollop' of sour cream can set you back 115 calories and a whopping 12g of fat-seven of which are of the artery-clogging variety. Added bonus: [place] yogurt is packed with muscle-building protein, calcium and B Vitamins. 11. Mash cauliflower instead of mashed potatoes. One cup of traditional mashed potatoes-in all their creamy goodness-has more than 200 calories, compared to mashed cauliflower, which you can typically eat for less than 100 calories per 1 cup serving. Cauliflower is a great source of the antioxidant indole-3-carbinol (I3C), which may help reduce the risk of some cancers, like breast cancer. 12. Ditch the ice cream sundae in favor of a [place] yogurt parfait. Instead of a cup of ice cream or fro-yo for dessert, try 1 cup of nonfat Greek yogurt topped with fresh berries and a sprinkle of cacao nibs. Both toppings are packed with antioxidants, which can help reduce cellular inflammation and oxidative damage. And the comparison is a no-brainer: One cup of ice cream has about 275 calories; one cup of frozen yogurt has about 230; and a cup of Greek yogurt has just 130, plus twice the protein, so you're less likely to return to the freezer for a second helping. 13. Put olive oil in a spray container instead of using it directly from the bottle. Each tablespoon of olive oil is 120 calories and 15g of fat. Use a mister instead of pouring  it straight into the pan or onto a salad. This allows for portion control and will save you more than 100 calories. 14. When baking, substitute canned pumpkin for butter or oil. Canned pumpkin-not pumpkin pie mix-is loaded with Vitamin A, which is important for skin and eye health, as well as immunity. And the comparisons are pretty crazy:  cup of canned pumpkin has about 40 calories, compared to butter or oil, which has more than 800 calories. Yes, 800 calories. Applesauce and mashed banana can also serve as good substitutions for butter or oil, usually in a 1:1 ratio. 15. Top casseroles with high-fiber cereal instead of breadcrumbs. Breadcrumbs are typically made with white bread, while breakfast cereals contain 5-9g of fiber per serving. Not only will you save more than 150 calories per  cup serving, the swap will also keep you more full and you'll get a metabolism boost from the added fiber. 16. Snack on pistachios instead of macadamia nuts. Believe it or not, you get the same amount of calories from 35 pistachios (100 calories) as you would from only five macadamia nuts. 17. Chow down on kale chips rather than potato chips. This is my favorite 'don't knock it 'till you try it' swap. Kale chips are so easy to make at home, and you can spice them up with a little grated parmesan or chili powder. Plus, they're a mere fraction of the calories of potato chips, but with the  same crunch factor we crave so often. 18. Add seltzer and some fruit slices to your cocktail instead of soda or fruit juice. One cup of soda or fruit juice can pack on as much as 140 calories. Instead, use seltzer and fruit slices. The fruit provides valuable phytochemicals, such as flavonoids and anthocyanins, which help to combat cancer and stave off the aging process.

## 2014-06-24 NOTE — Progress Notes (Signed)
Assessment and Plan:  1. Hypertension -Continue medication, monitor blood pressure at home. Continue DASH diet.  Reminder to go to the ER if any CP, SOB, nausea, dizziness, severe HA, changes vision/speech, left arm numbness and tingling and jaw pain.  2. Cholesterol -Continue diet and exercise. Check cholesterol.   3. Prediabetes  -Continue diet and exercise. Check A1C  4. Vitamin D Def - check level and continue medications.  5. Neuropathy/leg pain from previous injury Wants to try topamax 76m If it does not help can do lyrica samples.   Continue diet and meds as discussed. Further disposition pending results of labs. Over 30 minutes of exam, counseling, chart review, and critical decision making was performed  HPI 69y.o. female  presents for 3 month follow up on hypertension, cholesterol, prediabetes, and vitamin D deficiency.   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 She denies chest pain, shortness of breath, dizziness.  She is on cholesterol medication and denies myalgias. Her cholesterol is at goal. The cholesterol last visit was:   Lab Results  Component Value Date   CHOL 148 03/09/2014   HDL 47 03/09/2014   LDLCALC 67 03/09/2014   TRIG 172* 03/09/2014   CHOLHDL 3.1 03/09/2014    She has been working on diet and exercise for prediabetes x 2011, last visit her A1C was in the DM range for the first time, and denies paresthesia of the feet, polydipsia, polyuria and visual disturbances. Last A1C in the office was:  Lab Results  Component Value Date   HGBA1C 6.6* 03/09/2014   Patient is on Vitamin D supplement.   Lab Results  Component Value Date   VD25OH 69 03/09/2014     She broke her left leg in 2011 and states she has been having a lot of pain in that leg, has tried robaxin without help. It is worse at night, occ during the day. Pain is at there knee  and lateral lower leg, feels like a rope being twisted and at night with achy/burning pain. She has some lower back pain.   BMI is Body mass index is 28.33 kg/(m^2)., she is working on diet and exercise. Wt Readings from Last 3 Encounters:  06/24/14 169 lb (76.658 kg)  03/09/14 173 lb (78.472 kg)  08/18/13 171 lb (77.565 kg)    Current Medications:  Current Outpatient Prescriptions on File Prior to Visit  Medication Sig Dispense Refill  . aspirin 81 MG chewable tablet Chew by mouth daily.    .Marland Kitchenatorvastatin (LIPITOR) 80 MG tablet TAKE ONE TABLET BY MOUTH ONCE DAILY 30 tablet 99  . Blood Glucose Monitoring Suppl (ACCU-CHEK AVIVA PLUS) W/DEVICE KIT Check blood sugar 1 time daily-DX-R73.09 1 kit 0  . Cholecalciferol (VITAMIN D PO) Take 5,000 Int'l Units by mouth daily.    . enalapril (VASOTEC) 20 MG tablet TAKE ONE TABLET BY MOUTH EVERY DAY 90 tablet 2  . estradiol (ESTRACE) 1 MG tablet TAKE ONE TABLET BY MOUTH ONCE DAILY 90 tablet 99  . FLUZONE HIGH-DOSE 0.5 ML SUSY     . glucose blood (ACCU-CHEK AVIVA PLUS) test strip Check blood sugar 1 time daily-DX-R73.09 100 each 12  . hydrochlorothiazide (HYDRODIURIL) 25 MG tablet TAKE ONE TABLET BY MOUTH ONCE DAILY FOR BLOOD PRESSURE AND  FLUID 90 tablet 3  . Lancets (ACCU-CHEK SOFT TOUCH) lancets Check blood sugar 1 time daily-DX-R73.09 100  each 12  . MAGNESIUM PO Take 800 mg by mouth daily.     . methocarbamol (ROBAXIN) 500 MG tablet Take 1 tablet (500 mg total) by mouth 3 (three) times daily. 60 tablet 1  . Multiple Vitamins-Minerals (MULTIVITAMIN WITH MINERALS) tablet Take 1 tablet by mouth daily.    . Omega-3 Fatty Acids (FISH OIL PO) Take by mouth daily.    Marland Kitchen ZOSTAVAX 34196 UNT/0.65ML injection      No current facility-administered medications on file prior to visit.   Medical History:  Past Medical History  Diagnosis Date  . Hyperlipidemia   . Hypertension   . Prediabetes   . Vitamin D deficiency   . Allergy   . GERD (gastroesophageal  reflux disease)    Allergies: No Known Allergies   Review of Systems:  Review of Systems  Constitutional: Negative.   HENT: Negative.   Eyes: Negative.   Respiratory: Negative.   Cardiovascular: Negative.   Gastrointestinal: Negative.   Genitourinary: Negative.   Musculoskeletal: Positive for myalgias, back pain and joint pain. Negative for falls and neck pain.  Skin: Negative.   Neurological: Negative.  Negative for dizziness, tingling, tremors, sensory change, speech change, focal weakness, seizures and loss of consciousness.  Endo/Heme/Allergies: Negative.   Psychiatric/Behavioral: Negative.     Family history- Review and unchanged Social history- Review and unchanged Physical Exam: BP 102/62 mmHg  Pulse 76  Temp(Src) 97.7 F (36.5 C)  Resp 16  Ht 5' 4.75" (1.645 m)  Wt 169 lb (76.658 kg)  BMI 28.33 kg/m2 Wt Readings from Last 3 Encounters:  06/24/14 169 lb (76.658 kg)  03/09/14 173 lb (78.472 kg)  08/18/13 171 lb (77.565 kg)   General Appearance: Well nourished, in no apparent distress. Eyes: PERRLA, EOMs, conjunctiva no swelling or erythema Sinuses: No Frontal/maxillary tenderness ENT/Mouth: Ext aud canals clear, TMs without erythema, bulging. No erythema, swelling, or exudate on post pharynx.  Tonsils not swollen or erythematous. Hearing normal.  Neck: Supple, thyroid normal.  Respiratory: Respiratory effort normal, BS equal bilaterally without rales, rhonchi, wheezing or stridor.  Cardio: RRR with no MRGs. Brisk peripheral pulses without edema.  Abdomen: Soft, + BS,  Non tender, no guarding, rebound, hernias, masses. Lymphatics: Non tender without lymphadenopathy.  Musculoskeletal: Full ROM, 5/5 strength, Normal gait. Negative left SI tenderness, negative straight leg raise, good reflexes, pulses, and strength bilateral legs. Well healing scar on left lower lateral leg.  Skin: Warm, dry without rashes, lesions, ecchymosis.  Neuro: Cranial nerves intact. Normal  muscle tone, no cerebellar symptoms. Psych: Awake and oriented X 3, normal affect, Insight and Judgment appropriate.    Vicie Mutters, PA-C 10:45 AM [facility]

## 2014-06-25 LAB — HEPATIC FUNCTION PANEL
ALK PHOS: 75 U/L (ref 39–117)
ALT: 32 U/L (ref 0–35)
AST: 27 U/L (ref 0–37)
Albumin: 3.9 g/dL (ref 3.5–5.2)
Bilirubin, Direct: 0.1 mg/dL (ref 0.0–0.3)
Indirect Bilirubin: 0.4 mg/dL (ref 0.2–1.2)
Total Bilirubin: 0.5 mg/dL (ref 0.2–1.2)
Total Protein: 7.1 g/dL (ref 6.0–8.3)

## 2014-06-25 LAB — BASIC METABOLIC PANEL WITH GFR
BUN: 13 mg/dL (ref 6–23)
CALCIUM: 9.3 mg/dL (ref 8.4–10.5)
CO2: 27 meq/L (ref 19–32)
Chloride: 104 mEq/L (ref 96–112)
Creat: 0.81 mg/dL (ref 0.50–1.10)
GFR, EST AFRICAN AMERICAN: 86 mL/min
GFR, Est Non African American: 74 mL/min
Glucose, Bld: 137 mg/dL — ABNORMAL HIGH (ref 70–99)
Potassium: 4 mEq/L (ref 3.5–5.3)
Sodium: 140 mEq/L (ref 135–145)

## 2014-06-25 LAB — LIPID PANEL
Cholesterol: 161 mg/dL (ref 0–200)
HDL: 39 mg/dL — ABNORMAL LOW (ref >=46)
LDL Cholesterol: 95 mg/dL (ref 0–99)
Total CHOL/HDL Ratio: 4.1 Ratio
Triglycerides: 135 mg/dL (ref <150)
VLDL: 27 mg/dL (ref 0–40)

## 2014-06-25 LAB — CBC WITH DIFFERENTIAL/PLATELET
Basophils Absolute: 0 10*3/uL (ref 0.0–0.1)
Basophils Relative: 0 % (ref 0–1)
Eosinophils Absolute: 0.1 10*3/uL (ref 0.0–0.7)
Eosinophils Relative: 2 % (ref 0–5)
HEMATOCRIT: 40.7 % (ref 36.0–46.0)
HEMOGLOBIN: 13.4 g/dL (ref 12.0–15.0)
LYMPHS PCT: 37 % (ref 12–46)
Lymphs Abs: 1.7 10*3/uL (ref 0.7–4.0)
MCH: 28.1 pg (ref 26.0–34.0)
MCHC: 32.9 g/dL (ref 30.0–36.0)
MCV: 85.3 fL (ref 78.0–100.0)
MONO ABS: 0.5 10*3/uL (ref 0.1–1.0)
MPV: 9.8 fL (ref 8.6–12.4)
Monocytes Relative: 11 % (ref 3–12)
NEUTROS ABS: 2.3 10*3/uL (ref 1.7–7.7)
NEUTROS PCT: 50 % (ref 43–77)
Platelets: 255 10*3/uL (ref 150–400)
RBC: 4.77 MIL/uL (ref 3.87–5.11)
RDW: 14.6 % (ref 11.5–15.5)
WBC: 4.5 10*3/uL (ref 4.0–10.5)

## 2014-06-25 LAB — HEMOGLOBIN A1C
Hgb A1c MFr Bld: 7.4 % — ABNORMAL HIGH (ref <5.7)
MEAN PLASMA GLUCOSE: 166 mg/dL — AB (ref <117)

## 2014-06-25 LAB — MAGNESIUM: MAGNESIUM: 1.8 mg/dL (ref 1.5–2.5)

## 2014-06-25 LAB — TSH: TSH: 1.548 u[IU]/mL (ref 0.350–4.500)

## 2014-06-25 LAB — VITAMIN D 25 HYDROXY (VIT D DEFICIENCY, FRACTURES): Vit D, 25-Hydroxy: 58 ng/mL (ref 30–100)

## 2014-06-25 LAB — INSULIN, FASTING: INSULIN FASTING, SERUM: 12.8 u[IU]/mL (ref 2.0–19.6)

## 2014-07-22 ENCOUNTER — Encounter: Payer: Self-pay | Admitting: Gastroenterology

## 2014-08-21 ENCOUNTER — Encounter: Payer: Self-pay | Admitting: Internal Medicine

## 2014-08-24 ENCOUNTER — Other Ambulatory Visit: Payer: Self-pay | Admitting: Internal Medicine

## 2014-08-25 ENCOUNTER — Other Ambulatory Visit: Payer: Self-pay | Admitting: Internal Medicine

## 2014-08-25 DIAGNOSIS — Z1231 Encounter for screening mammogram for malignant neoplasm of breast: Secondary | ICD-10-CM

## 2014-09-07 ENCOUNTER — Other Ambulatory Visit: Payer: Self-pay | Admitting: Physician Assistant

## 2014-09-11 ENCOUNTER — Ambulatory Visit (HOSPITAL_COMMUNITY)
Admission: RE | Admit: 2014-09-11 | Discharge: 2014-09-11 | Disposition: A | Discharge status: Home / Self Care | Payer: Medicare Other | Source: Ambulatory Visit | Attending: Internal Medicine | Admitting: Internal Medicine

## 2014-09-11 DIAGNOSIS — Z1231 Encounter for screening mammogram for malignant neoplasm of breast: Secondary | ICD-10-CM | POA: Insufficient documentation

## 2014-10-14 ENCOUNTER — Encounter: Payer: Self-pay | Admitting: Internal Medicine

## 2014-10-20 ENCOUNTER — Ambulatory Visit (INDEPENDENT_AMBULATORY_CARE_PROVIDER_SITE_OTHER): Payer: Medicare Other | Admitting: Internal Medicine

## 2014-10-20 ENCOUNTER — Encounter: Payer: Self-pay | Admitting: Internal Medicine

## 2014-10-20 VITALS — BP 126/72 | HR 76 | Temp 97.3°F | Resp 16 | Ht 64.75 in | Wt 163.6 lb

## 2014-10-20 DIAGNOSIS — Z1389 Encounter for screening for other disorder: Secondary | ICD-10-CM | POA: Diagnosis not present

## 2014-10-20 DIAGNOSIS — Z789 Other specified health status: Secondary | ICD-10-CM | POA: Diagnosis not present

## 2014-10-20 DIAGNOSIS — E785 Hyperlipidemia, unspecified: Secondary | ICD-10-CM | POA: Diagnosis not present

## 2014-10-20 DIAGNOSIS — E119 Type 2 diabetes mellitus without complications: Secondary | ICD-10-CM

## 2014-10-20 DIAGNOSIS — E1129 Type 2 diabetes mellitus with other diabetic kidney complication: Secondary | ICD-10-CM

## 2014-10-20 DIAGNOSIS — Z6829 Body mass index (BMI) 29.0-29.9, adult: Secondary | ICD-10-CM

## 2014-10-20 DIAGNOSIS — E1122 Type 2 diabetes mellitus with diabetic chronic kidney disease: Secondary | ICD-10-CM | POA: Diagnosis not present

## 2014-10-20 DIAGNOSIS — E559 Vitamin D deficiency, unspecified: Secondary | ICD-10-CM | POA: Diagnosis not present

## 2014-10-20 DIAGNOSIS — K219 Gastro-esophageal reflux disease without esophagitis: Secondary | ICD-10-CM | POA: Diagnosis not present

## 2014-10-20 DIAGNOSIS — Z79899 Other long term (current) drug therapy: Secondary | ICD-10-CM

## 2014-10-20 DIAGNOSIS — Z1331 Encounter for screening for depression: Secondary | ICD-10-CM

## 2014-10-20 DIAGNOSIS — E663 Overweight: Secondary | ICD-10-CM | POA: Insufficient documentation

## 2014-10-20 DIAGNOSIS — Z1212 Encounter for screening for malignant neoplasm of rectum: Secondary | ICD-10-CM

## 2014-10-20 DIAGNOSIS — I10 Essential (primary) hypertension: Secondary | ICD-10-CM | POA: Diagnosis not present

## 2014-10-20 DIAGNOSIS — Z0000 Encounter for general adult medical examination without abnormal findings: Secondary | ICD-10-CM | POA: Insufficient documentation

## 2014-10-20 DIAGNOSIS — Z9181 History of falling: Secondary | ICD-10-CM

## 2014-10-20 LAB — BASIC METABOLIC PANEL WITH GFR
BUN: 13 mg/dL (ref 7–25)
CHLORIDE: 106 mmol/L (ref 98–110)
CO2: 29 mmol/L (ref 20–31)
Calcium: 9.3 mg/dL (ref 8.6–10.4)
Creat: 0.8 mg/dL (ref 0.60–0.93)
GFR, Est African American: 86 mL/min (ref >=60)
GFR, Est Non African American: 75 mL/min (ref >=60)
Glucose, Bld: 114 mg/dL — ABNORMAL HIGH (ref 65–99)
POTASSIUM: 3.7 mmol/L (ref 3.5–5.3)
SODIUM: 141 mmol/L (ref 135–146)

## 2014-10-20 LAB — MAGNESIUM: Magnesium: 2.1 mg/dL (ref 1.5–2.5)

## 2014-10-20 LAB — CBC WITH DIFFERENTIAL/PLATELET
BASOS ABS: 0.1 10*3/uL (ref 0.0–0.1)
Basophils Relative: 1 % (ref 0–1)
EOS PCT: 2 % (ref 0–5)
Eosinophils Absolute: 0.1 10*3/uL (ref 0.0–0.7)
HEMATOCRIT: 38.5 % (ref 36.0–46.0)
Hemoglobin: 12.9 g/dL (ref 12.0–15.0)
Lymphocytes Relative: 33 % (ref 12–46)
Lymphs Abs: 1.7 10*3/uL (ref 0.7–4.0)
MCH: 28 pg (ref 26.0–34.0)
MCHC: 33.5 g/dL (ref 30.0–36.0)
MCV: 83.7 fL (ref 78.0–100.0)
MPV: 9.9 fL (ref 8.6–12.4)
Monocytes Absolute: 0.5 10*3/uL (ref 0.1–1.0)
Monocytes Relative: 9 % (ref 3–12)
Neutro Abs: 2.8 10*3/uL (ref 1.7–7.7)
Neutrophils Relative %: 55 % (ref 43–77)
Platelets: 265 10*3/uL (ref 150–400)
RBC: 4.6 MIL/uL (ref 3.87–5.11)
RDW: 14.5 % (ref 11.5–15.5)
WBC: 5.1 10*3/uL (ref 4.0–10.5)

## 2014-10-20 LAB — HEPATIC FUNCTION PANEL
ALK PHOS: 75 U/L (ref 33–130)
ALT: 20 U/L (ref 6–29)
AST: 20 U/L (ref 10–35)
Albumin: 3.7 g/dL (ref 3.6–5.1)
BILIRUBIN DIRECT: 0.1 mg/dL (ref <=0.2)
BILIRUBIN TOTAL: 0.5 mg/dL (ref 0.2–1.2)
Indirect Bilirubin: 0.4 mg/dL (ref 0.2–1.2)
Total Protein: 6.9 g/dL (ref 6.1–8.1)

## 2014-10-20 LAB — LIPID PANEL
Cholesterol: 151 mg/dL (ref 125–200)
HDL: 39 mg/dL — AB (ref >=46)
LDL Cholesterol: 73 mg/dL (ref <130)
Total CHOL/HDL Ratio: 3.9 Ratio (ref <=5.0)
Triglycerides: 194 mg/dL — ABNORMAL HIGH (ref <150)
VLDL: 39 mg/dL — ABNORMAL HIGH (ref <30)

## 2014-10-20 LAB — TSH: TSH: 1.491 u[IU]/mL (ref 0.350–4.500)

## 2014-10-20 NOTE — Progress Notes (Deleted)
Patient ID: Kaitlyn Townsend, female   DOB: Jun 04, 1945, 69 y.o.   MRN: [id number]   Comprehensive Examination  This very nice 69 y.o.female presents for complete physical.  Patient has been followed for HTN, T2_NIDDM  , Hyperlipidemia, and Vitamin D Deficiency.    HTN predates since     . Patient's BP has been controlled at home and patient denies any cardiac symptoms as chest pain, palpitations, shortness of breath, dizziness or ankle swelling. Today's      Patient's hyperlipidemia is controlled with diet and medications. Patient denies myalgias or other medication SE's. Last lipids were at goal with Cholesterol 161; HDL 39*; LDL 95; &  Triglycerides 135 on 06/24/2014.   Patient has T2_NIDDM prediabetes predating since      and patient denies reactive hypoglycemic symptoms, visual blurring, diabetic polys, or paresthesias. Last A1c was 7.4% on 06/24/2014.   Finally, patient has history of Vitamin D Deficiency and last Vitamin D was y 78 on 06/24/2014.      Medication Sig  . aspirin 81 MG chewable tablet Chew by mouth daily.  Marland Kitchen atorvastatin (LIPITOR) 80 MG tablet TAKE ONE TABLET BY MOUTH ONCE DAILY  . Cholecalciferol (VITAMIN D PO) Take 5,000 Int'l Units by mouth daily.  . enalapril (VASOTEC) 20 MG tablet TAKE ONE TABLET BY MOUTH ONCE DAILY  . estradiol (ESTRACE) 1 MG tablet TAKE ONE TABLET BY MOUTH ONCE DAILY  . hctz 25 MG tablet TAKE ONE TABLET BY MOUTH ONCE DAILY FOR BLOOD PRESSURE AND  FLUID  . MAGNESIUM PO Take 800 mg by mouth daily.   . methocarbamol (ROBAXIN) 500 MG tablet Take 1 tablet (500 mg total) by mouth 3 (three) times daily.  . Multiple Vitamins-Minerals  Take 1 tablet by mouth daily.  Marland Kitchen FISH OIL  Take by mouth daily.  Marland Kitchen topiramate (TOPAMAX) 25 MG tablet TAKE 1-2 TABLETS BY MOUTH AT NIGHT   No Known Allergies   Past Medical History  Diagnosis Date  . Hyperlipidemia   . Hypertension   . Prediabetes   . Vitamin D deficiency   . Allergy   . GERD (gastroesophageal reflux  disease)    Health Maintenance  Topic Date Due  . Hepatitis C Screening  [birth year]  . INFLUENZA VACCINE  08/17/2014  . PNA vac Low Risk Adult (2 of 2 - PPSV23) 03/10/2015  . MAMMOGRAM  09/10/2016  . COLONOSCOPY  02/02/2020  . TETANUS/TDAP  08/10/2022  . DEXA SCAN  Completed  . ZOSTAVAX  Completed   Immunization History  Administered Date(s) Administered  . Influenza Split 10/31/2012  . Pneumococcal Conjugate-13 03/09/2014  . Td 08/09/2012  . Zoster 08/14/2012   Past Surgical History  Procedure Laterality Date  . Knee surgery Left 09/2009  . Abdominal hysterectomy Bilateral 1977    bso   Family History  Problem Relation Age of Onset  . Heart disease Mother   . Ulcers Father   . Heart disease Brother   . Hypertension Brother   . Kidney disease Brother    Social History  Substance Use Topics  . Smoking status: Former Smoker    Types: Cigarettes    Quit date: 01/16/1994  . Smokeless tobacco: Not on file  . Alcohol Use: Yes     Comment: ocassional    ROS Constitutional: Denies fever, chills, weight loss/gain, headaches, insomnia,  night sweats, and change in appetite. Does c/o fatigue. Eyes: Denies redness, blurred vision, diplopia, discharge, itchy, watery eyes.  ENT: Denies discharge, congestion, post nasal drip,  epistaxis, sore throat, earache, hearing loss, dental pain, Tinnitus, Vertigo, Sinus pain, snoring.  Cardio: Denies chest pain, palpitations, irregular heartbeat, syncope, dyspnea, diaphoresis, orthopnea, PND, claudication, edema Respiratory: denies cough, dyspnea, DOE, pleurisy, hoarseness, laryngitis, wheezing.  Gastrointestinal: Denies dysphagia, heartburn, reflux, water brash, pain, cramps, nausea, vomiting, bloating, diarrhea, constipation, hematemesis, melena, hematochezia, jaundice, hemorrhoids Genitourinary: Denies dysuria, frequency, urgency, nocturia, hesitancy, discharge, hematuria, flank pain Breast: Breast lumps, nipple discharge, bleeding.   Musculoskeletal: Denies arthralgia, myalgia, stiffness, Jt. Swelling, pain, limp, and strain/sprain. Denies falls. Skin: Denies puritis, rash, hives, warts, acne, eczema, changing in skin lesion Neuro: No weakness, tremor, incoordination, spasms, paresthesia, pain Psychiatric: Denies confusion, memory loss, sensory loss. Denies Depression. Endocrine: Denies change in weight, skin, hair change, nocturia, and paresthesia, diabetic polys, visual blurring, hyper / hypo glycemic episodes.  Heme/Lymph: No excessive bleeding, bruising, enlarged lymph nodes.  Physical Exam  There were no vitals taken for this visit.  General Appearance: Well nourished and in no apparent distress. Eyes: PERRLA, EOMs, conjunctiva no swelling or erythema, normal fundi and vessels. Sinuses: No frontal/maxillary tenderness ENT/Mouth: EACs patent / TMs  nl. Nares clear without erythema, swelling, mucoid exudates. Oral hygiene is good. No erythema, swelling, or exudate. Tongue normal, non-obstructing. Tonsils not swollen or erythematous. Hearing normal.  Neck: Supple, thyroid normal. No bruits, nodes or JVD. Respiratory: Respiratory effort normal.  BS equal and clear bilateral without rales, rhonci, wheezing or stridor. Cardio: Heart sounds are normal with regular rate and rhythm and no murmurs, rubs or gallops. Peripheral pulses are normal and equal bilaterally without edema. No aortic or femoral bruits. Chest: symmetric with normal excursions and percussion. Breasts: Symmetric, without lumps, nipple discharge, retractions, or fibrocystic changes.  Abdomen: Flat, soft, with bowel sounds. Nontender, no guarding, rebound, hernias, masses, or organomegaly.  Lymphatics: Non tender without lymphadenopathy.  Genitourinary:  Musculoskeletal: Full ROM all peripheral extremities, joint stability, 5/5 strength, and normal gait. Skin: Warm and dry without rashes, lesions, cyanosis, clubbing or  ecchymosis.  Neuro: Cranial nerves  intact, reflexes equal bilaterally. Normal muscle tone, no cerebellar symptoms. Sensation intact.  Pysch: Alert and oriented X 3, normal affect, Insight and Judgment appropriate.   Assessment and Plan   Continue prudent diet as discussed, weight control, BP monitoring, regular exercise, and medications. Discussed med's effects and SE's. Screening labs and tests as requested with regular follow-up as recommended.  Over 40 minutes of exam, counseling, chart review was performed.

## 2014-10-20 NOTE — Patient Instructions (Signed)
Recommend Adult Low dose Aspirin or   coated  Aspirin 81 mg daily   To reduce risk of Colon Cancer 20 %,   Skin Cancer 26 % ,   Melanoma 46%   and   Pancreatic cancer 60%  ++++++++++++++++++  Vitamin D goal   is between 70-100.   Please make sure that you are taking your Vitamin D as directed.   It is very important as a natural anti-inflammatory   helping hair, skin, and nails, as well as reducing stroke and heart attack risk.   It helps your bones and helps with mood.  It also decreases numerous cancer risks so please take it as directed.   Low Vit D is associated with a 200-300% higher risk for CANCER   and 200-300% higher risk for HEART   ATTACK  &  STROKE.   ......................................  It is also associated with higher death rate at younger ages,   autoimmune diseases like Rheumatoid arthritis, Lupus, Multiple Sclerosis.     Also many other serious conditions, like depression, Alzheimer's  Dementia, infertility, muscle aches, fatigue, fibromyalgia - just to name a few.  +++++++++++++++++++  Recommend the book "The END of DIETING" by Dr Joel Fuhrman   & the book "The END of DIABETES " by Dr Joel Fuhrman  At [employer] - get book & Audio CD's     Being diabetic has a  300% increased risk for heart attack, stroke, cancer, and alzheimer- type vascular dementia. It is very important that you work harder with diet by avoiding all foods that are white. Avoid white rice (brown & wild rice is OK), white potatoes (sweetpotatoes in moderation is OK), White bread or wheat bread or anything made out of white flour like bagels, donuts, rolls, buns, biscuits, cakes, pastries, cookies, pizza crust, and pasta (made from white flour & egg whites) - vegetarian pasta or spinach or wheat pasta is OK. Multigrain breads like Arnold's or [employer], or multigrain sandwich thins or flatbreads.  Diet, exercise and weight loss can reverse and cure diabetes in the early  stages.  Diet, exercise and weight loss is very important in the control and prevention of complications of diabetes which affects every system in your body, ie. Brain - dementia/stroke, eyes - glaucoma/blindness, heart - heart attack/heart failure, kidneys - dialysis, stomach - gastric paralysis, intestines - malabsorption, nerves - severe painful neuritis, circulation - gangrene & loss of a leg(s), and finally cancer and Alzheimers.    I recommend avoid fried & greasy foods,  sweets/candy, white rice (brown or wild rice or Quinoa is OK), white potatoes (sweet potatoes are OK) - anything made from white flour - bagels, doughnuts, rolls, buns, biscuits,white and wheat breads, pizza crust and traditional pasta made of white flour & egg white(vegetarian pasta or spinach or wheat pasta is OK).  Multi-grain bread is OK - like multi-grain flat bread or sandwich thins. Avoid alcohol in excess. Exercise is also important.    Eat all the vegetables you want - avoid meat, especially red meat and dairy - especially cheese.  Cheese is the most concentrated form of trans-fats which is the worst thing to clog up our arteries. Veggie cheese is OK which can be found in the fresh produce section at [employer] or [employer] or [employer]  ++++++++++++++++++++++++++   Preventive Care for Adults  A healthy lifestyle and preventive care can promote health and wellness. Preventive health guidelines for women include the following key practices.  A routine   to check with your health care provider about your health and preventive screening. It is a chance to share any concerns and updates on your health and to receive a thorough exam.  Visit your dentist for a routine exam and preventive care every 6 months. Brush your teeth twice a day and floss once a day. Good oral hygiene prevents tooth decay and gum disease.  The frequency of eye exams is based on your age, health, family medical history, use  of contact lenses, and other factors. Follow your health care provider's recommendations for frequency of eye exams.  Eat a healthy diet. Foods like vegetables, fruits, whole grains, low-fat dairy products, and lean protein foods contain the nutrients you need without too many calories. Decrease your intake of foods high in solid fats, added sugars, and salt. Eat the right amount of calories for you.Get information about a proper diet from your health care provider, if necessary.  Regular physical exercise is one of the most important things you can do for your health. Most adults should get at least 150 minutes of moderate-intensity exercise (any activity that increases your heart rate and causes you to sweat) each week. In addition, most adults need muscle-strengthening exercises on 2 or more days a week.  Maintain a healthy weight. The body mass index (BMI) is a screening tool to identify possible weight problems. It provides an estimate of body fat based on height and weight. Your health care provider can find your BMI and can help you achieve or maintain a healthy weight.For adults 20 years and older:  A BMI below 18.5 is considered underweight.  A BMI of 18.5 to 24.9 is normal.  A BMI of 25 to 29.9 is considered overweight.  A BMI of 30 and above is considered obese.  Maintain normal blood lipids and cholesterol levels by exercising and minimizing your intake of saturated fat. Eat a balanced diet with plenty of fruit and vegetables. If your lipid or cholesterol levels are high, you are over 50, or you are at high risk for heart disease, you may need your cholesterol levels checked more frequently.Ongoing high lipid and cholesterol levels should be treated with medicines if diet and exercise are not working.  If you smoke, find out from your health care provider how to quit. If you do not use tobacco, do not start.  Lung cancer screening is recommended for adults aged 55-80 years who are  at high risk for developing lung cancer because of a history of smoking. A yearly low-dose CT scan of the lungs is recommended for people who have at least a 30-pack-year history of smoking and are a current smoker or have quit within the past 15 years. A pack year of smoking is smoking an average of 1 pack of cigarettes a day for 1 year (for example: 1 pack a day for 30 years or 2 packs a day for 15 years). Yearly screening should continue until the smoker has stopped smoking for at least 15 years. Yearly screening should be stopped for people who develop a health problem that would prevent them from having lung cancer treatment.  Avoid use of street drugs. Do not share needles with anyone. Ask for help if you need support or instructions about stopping the use of drugs.  High blood pressure causes heart disease and increases the risk of stroke.  Ongoing high blood pressure should be treated with medicines if weight loss and exercise do not work.  If you are 55-79   years old, ask your health care provider if you should take aspirin to prevent strokes.  Diabetes screening involves taking a blood sample to check your fasting blood sugar level. This should be done once every 3 years, after age 45, if you are within normal weight and without risk factors for diabetes. Testing should be considered at a younger age or be carried out more frequently if you are overweight and have at least 1 risk factor for diabetes.  Breast cancer screening is essential preventive care for women. You should practice "breast self-awareness." This means understanding the normal appearance and feel of your breasts and may include breast self-examination. Any changes detected, no matter how small, should be reported to a health care provider. Women in their 20s and 30s should have a clinical breast exam (CBE) by a health care provider as part of a regular health exam every 1 to 3 years. After age 40, women should have a CBE every  year. Starting at age 40, women should consider having a mammogram (breast X-ray test) every year. Women who have a family history of breast cancer should talk to their health care provider about genetic screening. Women at a high risk of breast cancer should talk to their health care providers about having an MRI and a mammogram every year.  Breast cancer gene (BRCA)-related cancer risk assessment is recommended for women who have family members with BRCA-related cancers. BRCA-related cancers include breast, ovarian, tubal, and peritoneal cancers. Having family members with these cancers may be associated with an increased risk for harmful changes (mutations) in the breast cancer genes BRCA1 and BRCA2. Results of the assessment will determine the need for genetic counseling and BRCA1 and BRCA2 testing.  Routine pelvic exams to screen for cancer are no longer recommended for nonpregnant women who are considered low risk for cancer of the pelvic organs (ovaries, uterus, and vagina) and who do not have symptoms. Ask your health care provider if a screening pelvic exam is right for you.  If you have had past treatment for cervical cancer or a condition that could lead to cancer, you need Pap tests and screening for cancer for at least 20 years after your treatment. If Pap tests have been discontinued, your risk factors (such as having a new sexual partner) need to be reassessed to determine if screening should be resumed. Some women have medical problems that increase the chance of getting cervical cancer. In these cases, your health care provider may recommend more frequent screening and Pap tests.    Colorectal cancer can be detected and often prevented. Most routine colorectal cancer screening begins at the age of 50 years and continues through age 75 years. However, your health care provider may recommend screening at an earlier age if you have risk factors for colon cancer. On a yearly basis, your health  care provider may provide home test kits to check for hidden blood in the stool. Use of a small camera at the end of a tube, to directly examine the colon (sigmoidoscopy or colonoscopy), can detect the earliest forms of colorectal cancer. Talk to your health care provider about this at age 50, when routine screening begins. Direct exam of the colon should be repeated every 5-10 years through age 75 years, unless early forms of pre-cancerous polyps or small growths are found.  Osteoporosis is a disease in which the bones lose minerals and strength with aging. This can result in serious bone fractures or breaks. The risk of osteoporosis   can be identified using a bone density scan. Women ages 65 years and over and women at risk for fractures or osteoporosis should discuss screening with their health care providers. Ask your health care provider whether you should take a calcium supplement or vitamin D to reduce the rate of osteoporosis.  Menopause can be associated with physical symptoms and risks. Hormone replacement therapy is available to decrease symptoms and risks. You should talk to your health care provider about whether hormone replacement therapy is right for you.  Use sunscreen. Apply sunscreen liberally and repeatedly throughout the day. You should seek shade when your shadow is shorter than you. Protect yourself by wearing long sleeves, pants, a wide-brimmed hat, and sunglasses year round, whenever you are outdoors.  Once a month, do a whole body skin exam, using a mirror to look at the skin on your back. Tell your health care provider of new moles, moles that have irregular borders, moles that are larger than a pencil eraser, or moles that have changed in shape or color.  Stay current with required vaccines (immunizations).  Influenza vaccine. All adults should be immunized every year.  Tetanus, diphtheria, and acellular pertussis (Td, Tdap) vaccine. Pregnant women should receive 1 dose of  Tdap vaccine during each pregnancy. The dose should be obtained regardless of the length of time since the last dose. Immunization is preferred during the 27th-36th week of gestation. An adult who has not previously received Tdap or who does not know her vaccine status should receive 1 dose of Tdap. This initial dose should be followed by tetanus and diphtheria toxoids (Td) booster doses every 10 years. Adults with an unknown or incomplete history of completing a 3-dose immunization series with Td-containing vaccines should begin or complete a primary immunization series including a Tdap dose. Adults should receive a Td booster every 10 years.    Zoster vaccine. One dose is recommended for adults aged 60 years or older unless certain conditions are present.    Pneumococcal 13-valent conjugate (PCV13) vaccine. When indicated, a person who is uncertain of her immunization history and has no record of immunization should receive the PCV13 vaccine. An adult aged 19 years or older who has certain medical conditions and has not been previously immunized should receive 1 dose of PCV13 vaccine. This PCV13 should be followed with a dose of pneumococcal polysaccharide (PPSV23) vaccine. The PPSV23 vaccine dose should be obtained at least 8 weeks after the dose of PCV13 vaccine. An adult aged 19 years or older who has certain medical conditions and previously received 1 or more doses of PPSV23 vaccine should receive 1 dose of PCV13. The PCV13 vaccine dose should be obtained 1 or more years after the last PPSV23 vaccine dose.    Pneumococcal polysaccharide (PPSV23) vaccine. When PCV13 is also indicated, PCV13 should be obtained first. All adults aged 65 years and older should be immunized. An adult younger than age 65 years who has certain medical conditions should be immunized. Any person who resides in a nursing home or long-term care facility should be immunized. An adult smoker should be immunized. People with an  immunocompromised condition and certain other conditions should receive both PCV13 and PPSV23 vaccines. People with human immunodeficiency virus (HIV) infection should be immunized as soon as possible after diagnosis. Immunization during chemotherapy or radiation therapy should be avoided. Routine use of PPSV23 vaccine is not recommended for American Indians, [place] Natives, or people younger than 65 years unless there are medical conditions that require   PPSV23 vaccine. When indicated, people who have unknown immunization and have no record of immunization should receive PPSV23 vaccine. One-time revaccination 5 years after the first dose of PPSV23 is recommended for people aged 19-64 years who have chronic kidney failure, nephrotic syndrome, asplenia, or immunocompromised conditions. People who received 1-2 doses of PPSV23 before age 65 years should receive another dose of PPSV23 vaccine at age 65 years or later if at least 5 years have passed since the previous dose. Doses of PPSV23 are not needed for people immunized with PPSV23 at or after age 65 years.   Preventive Services / Frequency  Ages 65 years and over  Blood pressure check.  Lipid and cholesterol check.  Lung cancer screening. / Every year if you are aged 55-80 years and have a 30-pack-year history of smoking and currently smoke or have quit within the past 15 years. Yearly screening is stopped once you have quit smoking for at least 15 years or develop a health problem that would prevent you from having lung cancer treatment.  Clinical breast exam.** / Every year after age 40 years.  BRCA-related cancer risk assessment.** / For women who have family members with a BRCA-related cancer (breast, ovarian, tubal, or peritoneal cancers).  Mammogram.** / Every year beginning at age 40 years and continuing for as long as you are in good health. Consult with your health care provider.  Pap test.** / Every 3 years starting at age 30 years  through age 65 or 70 years with 3 consecutive normal Pap tests. Testing can be stopped between 65 and 70 years with 3 consecutive normal Pap tests and no abnormal Pap or HPV tests in the past 10 years.  Fecal occult blood test (FOBT) of stool. / Every year beginning at age 50 years and continuing until age 75 years. You may not need to do this test if you get a colonoscopy every 10 years.  Flexible sigmoidoscopy or colonoscopy.** / Every 5 years for a flexible sigmoidoscopy or every 10 years for a colonoscopy beginning at age 50 years and continuing until age 75 years.  Hepatitis C blood test.** / For all people born from 1945 through 1965 and any individual with known risks for hepatitis C.  Osteoporosis screening.** / A one-time screening for women ages 65 years and over and women at risk for fractures or osteoporosis.  Skin self-exam. / Monthly.  Influenza vaccine. / Every year.  Tetanus, diphtheria, and acellular pertussis (Tdap/Td) vaccine.** / 1 dose of Td every 10 years.  Zoster vaccine.** / 1 dose for adults aged 60 years or older.  Pneumococcal 13-valent conjugate (PCV13) vaccine.** / Consult your health care provider.  Pneumococcal polysaccharide (PPSV23) vaccine.** / 1 dose for all adults aged 65 years and older. Screening for abdominal aortic aneurysm (AAA)  by ultrasound is recommended for people who have history of high blood pressure or who are current or former smokers. 

## 2014-10-20 NOTE — Progress Notes (Addendum)
Patient ID: Kaitlyn Townsend, female   DOB: 08/19/1945, 69 y.o.   MRN: [id number]   Comprehensive Examination  This very nice 69 y.o. MBF  presents for complete physical.  Patient has been followed for HTN, T2_NIDDM, Hyperlipidemia, and Vitamin D Deficiency.    HTN predates since 1995. Patient's BP has been controlled at home and patient denies any cardiac symptoms as chest pain, palpitations, shortness of breath, dizziness or ankle swelling. Today's BP: 126/72 mmHg    Patient's hyperlipidemia is controlled with diet and medications. Patient denies myalgias or other medication SE's. Last lipids were at goal with Cholesterol 161; HDL 39*; LDL 95; Triglycerides 135 on 06/24/2014.    Patient has  prediabetes predating since 2011 with A1c 6.0% and more recently her A1c  was 7.4% in the Diabetic range and she was encouraged a much stricter diet and weight loss in hopes of avoiding medications.  Patient denies reactive hypoglycemic symptoms, visual blurring, diabetic polys, or paresthesias. Last A1c as noted  was   7.4% on 06/24/2014. Patient alleges a stricter diet and having lost 2-3 # since then.    Finally, patient has history of Vitamin D Deficiency and last Vitamin D was  58 on 06/24/2014.      Medication Sig  . aspirin 81 MG chewable tablet Chew by mouth daily.  Marland Kitchen atorvastatin (LIPITOR) 80 MG tablet TAKE ONE TABLET BY MOUTH ONCE DAILY  . Cholecalciferol (VITAMIN D PO) Take 5,000 Int'l Units by mouth daily.  . enalapril (VASOTEC) 20 MG tablet TAKE ONE TABLET BY MOUTH ONCE DAILY  . estradiol (ESTRACE) 1 MG tablet TAKE ONE TABLET BY MOUTH ONCE DAILY  . hctz 25 MG tablet TAKE ONE TABLET BY MOUTH ONCE DAILY FOR BLOOD PRESSURE AND  FLUID  . MAGNESIUM PO Take 800 mg by mouth daily.   . methocarbamol (ROBAXIN) 500 MG tablet Take 1 tablet (500 mg total) by mouth 3 (three) times daily.  . Multiple Vitamins-Minerals  Take 1 tablet by mouth daily.  . Omega-3 Fatty Acids (FISH OIL PO) Take by mouth daily.  Marland Kitchen  topiramate  25 MG tablet TAKE 1-2 TABLETS BY MOUTH AT NIGHT   No Known Allergies   Past Medical History  Diagnosis Date  . Hyperlipidemia   . Hypertension   . Prediabetes   . Vitamin D deficiency   . Allergy   . GERD (gastroesophageal reflux disease)    Health Maintenance  Topic Date Due  . Hepatitis C Screening  Mar 24, 1945  . FOOT EXAM  09/01/1954  . OPHTHALMOLOGY EXAM  09/01/1954  . INFLUENZA VACCINE  08/17/2014  . URINE MICROALBUMIN  08/19/2014  . HEMOGLOBIN A1C  12/24/2014  . PNA vac Low Risk Adult (2 of 2 - PPSV23) 03/10/2015  . MAMMOGRAM  09/10/2016  . COLONOSCOPY  02/02/2020  . TETANUS/TDAP  08/10/2022  . DEXA SCAN  Completed  . ZOSTAVAX  Completed   Immunization History  Administered Date(s) Administered  . Influenza Split 10/31/2012  . Influenza-Unspecified 09/26/2014  . Pneumococcal Conjugate-13 03/09/2014  . Td 08/09/2012  . Zoster 08/14/2012   Past Surgical History  Procedure Laterality Date  . Knee surgery Left 09/2009  . Abdominal hysterectomy Bilateral 1977    bso   Family History  Problem Relation Age of Onset  . Heart disease Mother   . Ulcers Father   . Heart disease Brother   . Hypertension Brother   . Kidney disease Brother    Social History  Substance Use Topics  . Smoking  status: Former Smoker    Types: Cigarettes    Quit date: 01/16/1994  . Smokeless tobacco: None  . Alcohol Use: Yes     Comment: ocassional    ROS Constitutional: Denies fever, chills, weight loss/gain, headaches, insomnia,  night sweats, and change in appetite. Does c/o fatigue. Eyes: Denies redness, blurred vision, diplopia, discharge, itchy, watery eyes.  ENT: Denies discharge, congestion, post nasal drip, epistaxis, sore throat, earache, hearing loss, dental pain, Tinnitus, Vertigo, Sinus pain, snoring.  Cardio: Denies chest pain, palpitations, irregular heartbeat, syncope, dyspnea, diaphoresis, orthopnea, PND, claudication, edema Respiratory: denies cough,  dyspnea, DOE, pleurisy, hoarseness, laryngitis, wheezing.  Gastrointestinal: Denies dysphagia, heartburn, reflux, water brash, pain, cramps, nausea, vomiting, bloating, diarrhea, constipation, hematemesis, melena, hematochezia, jaundice, hemorrhoids Genitourinary: Denies dysuria, frequency, urgency, nocturia, hesitancy, discharge, hematuria, flank pain Breast: Breast lumps, nipple discharge, bleeding.  Musculoskeletal: Denies arthralgia, myalgia, stiffness, Jt. Swelling, pain, limp, and strain/sprain. Denies falls. Skin: Denies puritis, rash, hives, warts, acne, eczema, changing in skin lesion Neuro: No weakness, tremor, incoordination, spasms, paresthesia, pain Psychiatric: Denies confusion, memory loss, sensory loss. Denies Depression. Endocrine: Denies change in weight, skin, hair change, nocturia, and paresthesia, diabetic polys, visual blurring, hyper / hypo glycemic episodes.  Heme/Lymph: No excessive bleeding, bruising, enlarged lymph nodes.  Physical Exam  BP 126/72 mmHg  Pulse 76  Temp(Src) 97.3 F (36.3 C)  Resp 16  Ht 5' 4.75" (1.645 m)  Wt 163 lb 9.6 oz (74.208 kg)  BMI 27.42 kg/m2  General Appearance: Well nourished and in no apparent distress. Eyes: PERRLA, EOMs, conjunctiva no swelling or erythema, normal fundi and vessels. Sinuses: No frontal/maxillary tenderness ENT/Mouth: EACs patent / TMs  nl. Nares clear without erythema, swelling, mucoid exudates. Oral hygiene is good. No erythema, swelling, or exudate. Tongue normal, non-obstructing. Tonsils not swollen or erythematous. Hearing normal.  Neck: Supple, thyroid normal. No bruits, nodes or JVD. Respiratory: Respiratory effort normal.  BS equal and clear bilateral without rales, rhonci, wheezing or stridor. Cardio: Heart sounds are normal with regular rate and rhythm and no murmurs, rubs or gallops. Peripheral pulses are normal and equal bilaterally without edema. No aortic or femoral bruits. Chest: symmetric with normal  excursions and percussion. Breasts: Symmetric, without lumps, nipple discharge, retractions, or fibrocystic changes.  Abdomen: Flat, soft, with bowel sounds. Nontender, no guarding, rebound, hernias, masses, or organomegaly.  Lymphatics: Non tender without lymphadenopathy.  Musculoskeletal: Full ROM all peripheral extremities, joint stability, 5/5 strength, and normal gait. Skin: Warm and dry without rashes, lesions, cyanosis, clubbing or  ecchymosis.  Neuro: Cranial nerves intact, reflexes equal bilaterally. Normal muscle tone, no cerebellar symptoms. Sensation intact to touch and monofilament testing to the toes bilateral.  Pysch: Alert and oriented X 3, normal affect, Insight and Judgment appropriate.   Assessment and Plan  1. Essential hypertension  - Microalbumin / creatinine urine ratio - EKG 12-Lead - [place], RETROPERITNL ABD,  LTD - TSH  2. Hyperlipidemia  - Lipid panel  3. Type 2 diabetes mellitus with diabetic chronic kidney disease, unspecified CKD stage, unspecified long term insulin use status ([place])   4. Vitamin D deficiency  - Vit D  25 hydroxy   5. Gastroesophageal reflux disease, esophagitis presence not specified   6. Screening for rectal cancer   7. T2_NIDDM ([place])  - Hemoglobin A1c - Insulin, random  8. BMI 29.0-29.9,adult   9. Medication management  - CBC with Differential/Platelet - BASIC METABOLIC PANEL WITH GFR - Hepatic function panel - Magnesium   Continue prudent diet  as discussed, weight control, BP monitoring, regular exercise, and medications. Discussed med's effects and SE's. Screening labs and tests as requested with regular follow-up as recommended.  Over 40 minutes of exam, counseling, chart review was performed.

## 2014-10-21 LAB — MICROALBUMIN / CREATININE URINE RATIO
Creatinine, Urine: 113.7 mg/dL
MICROALB UR: 0.6 mg/dL (ref <2.0)
Microalb Creat Ratio: 5.3 mg/g (ref 0.0–30.0)

## 2014-10-21 LAB — HEMOGLOBIN A1C
HEMOGLOBIN A1C: 6.5 % — AB (ref <5.7)
MEAN PLASMA GLUCOSE: 140 mg/dL — AB (ref <117)

## 2014-10-21 LAB — INSULIN, RANDOM: INSULIN: 10 u[IU]/mL (ref 2.0–19.6)

## 2014-10-21 LAB — VITAMIN D 25 HYDROXY (VIT D DEFICIENCY, FRACTURES): VIT D 25 HYDROXY: 60 ng/mL (ref 30–100)

## 2014-10-30 ENCOUNTER — Other Ambulatory Visit (INDEPENDENT_AMBULATORY_CARE_PROVIDER_SITE_OTHER): Payer: Medicare Other | Admitting: *Deleted

## 2014-10-30 DIAGNOSIS — Z1212 Encounter for screening for malignant neoplasm of rectum: Secondary | ICD-10-CM | POA: Diagnosis not present

## 2014-10-30 LAB — POC HEMOCCULT BLD/STL (HOME/3-CARD/SCREEN)
Card #3 Fecal Occult Blood, POC: NEGATIVE
FECAL OCCULT BLD: NEGATIVE
Fecal Occult Blood, POC: NEGATIVE

## 2014-11-23 ENCOUNTER — Other Ambulatory Visit: Payer: Self-pay | Admitting: Internal Medicine

## 2014-11-30 ENCOUNTER — Other Ambulatory Visit: Payer: Self-pay | Admitting: Internal Medicine

## 2014-12-28 ENCOUNTER — Other Ambulatory Visit: Payer: Self-pay | Admitting: Internal Medicine

## 2015-01-29 ENCOUNTER — Ambulatory Visit (INDEPENDENT_AMBULATORY_CARE_PROVIDER_SITE_OTHER): Payer: Medicare Other | Admitting: Internal Medicine

## 2015-01-29 ENCOUNTER — Encounter: Payer: Self-pay | Admitting: Internal Medicine

## 2015-01-29 VITALS — BP 126/64 | HR 72 | Temp 98.2°F | Resp 16 | Ht 64.75 in | Wt 165.0 lb

## 2015-01-29 DIAGNOSIS — E1122 Type 2 diabetes mellitus with diabetic chronic kidney disease: Secondary | ICD-10-CM | POA: Diagnosis not present

## 2015-01-29 DIAGNOSIS — Z79899 Other long term (current) drug therapy: Secondary | ICD-10-CM

## 2015-01-29 DIAGNOSIS — R6889 Other general symptoms and signs: Secondary | ICD-10-CM | POA: Diagnosis not present

## 2015-01-29 DIAGNOSIS — Z0001 Encounter for general adult medical examination with abnormal findings: Secondary | ICD-10-CM

## 2015-01-29 DIAGNOSIS — Z6829 Body mass index (BMI) 29.0-29.9, adult: Secondary | ICD-10-CM | POA: Diagnosis not present

## 2015-01-29 DIAGNOSIS — E785 Hyperlipidemia, unspecified: Secondary | ICD-10-CM

## 2015-01-29 DIAGNOSIS — Z0000 Encounter for general adult medical examination without abnormal findings: Secondary | ICD-10-CM

## 2015-01-29 DIAGNOSIS — E559 Vitamin D deficiency, unspecified: Secondary | ICD-10-CM | POA: Diagnosis not present

## 2015-01-29 DIAGNOSIS — I10 Essential (primary) hypertension: Secondary | ICD-10-CM

## 2015-01-29 DIAGNOSIS — K219 Gastro-esophageal reflux disease without esophagitis: Secondary | ICD-10-CM

## 2015-01-29 LAB — LIPID PANEL
Cholesterol: 179 mg/dL (ref 125–200)
HDL: 46 mg/dL (ref >=46)
LDL CALC: 97 mg/dL (ref <130)
Total CHOL/HDL Ratio: 3.9 Ratio (ref <=5.0)
Triglycerides: 181 mg/dL — ABNORMAL HIGH (ref <150)
VLDL: 36 mg/dL — ABNORMAL HIGH (ref <30)

## 2015-01-29 LAB — BASIC METABOLIC PANEL WITH GFR
BUN: 11 mg/dL (ref 7–25)
CALCIUM: 9.9 mg/dL (ref 8.6–10.4)
CHLORIDE: 98 mmol/L (ref 98–110)
CO2: 32 mmol/L — AB (ref 20–31)
CREATININE: 0.75 mg/dL (ref 0.60–0.93)
GFR, Est African American: >89 mL/min (ref >=60)
GFR, Est Non African American: 81 mL/min (ref >=60)
GLUCOSE: 102 mg/dL — AB (ref 65–99)
Potassium: 3.7 mmol/L (ref 3.5–5.3)
SODIUM: 137 mmol/L (ref 135–146)

## 2015-01-29 LAB — CBC WITH DIFFERENTIAL/PLATELET
Basophils Absolute: 0 10*3/uL (ref 0.0–0.1)
Basophils Relative: 1 % (ref 0–1)
Eosinophils Absolute: 0.1 10*3/uL (ref 0.0–0.7)
Eosinophils Relative: 3 % (ref 0–5)
HEMATOCRIT: 41.2 % (ref 36.0–46.0)
HEMOGLOBIN: 13.7 g/dL (ref 12.0–15.0)
LYMPHS ABS: 1.6 10*3/uL (ref 0.7–4.0)
LYMPHS PCT: 42 % (ref 12–46)
MCH: 27.8 pg (ref 26.0–34.0)
MCHC: 33.3 g/dL (ref 30.0–36.0)
MCV: 83.7 fL (ref 78.0–100.0)
MONO ABS: 0.4 10*3/uL (ref 0.1–1.0)
MONOS PCT: 11 % (ref 3–12)
MPV: 9.8 fL (ref 8.6–12.4)
NEUTROS ABS: 1.6 10*3/uL — AB (ref 1.7–7.7)
NEUTROS PCT: 43 % (ref 43–77)
Platelets: 284 10*3/uL (ref 150–400)
RBC: 4.92 MIL/uL (ref 3.87–5.11)
RDW: 14.6 % (ref 11.5–15.5)
WBC: 3.7 10*3/uL — ABNORMAL LOW (ref 4.0–10.5)

## 2015-01-29 LAB — HEPATIC FUNCTION PANEL
ALT: 21 U/L (ref 6–29)
AST: 24 U/L (ref 10–35)
Albumin: 4.1 g/dL (ref 3.6–5.1)
Alkaline Phosphatase: 64 U/L (ref 33–130)
BILIRUBIN DIRECT: 0.1 mg/dL (ref <=0.2)
Indirect Bilirubin: 0.5 mg/dL (ref 0.2–1.2)
TOTAL PROTEIN: 7 g/dL (ref 6.1–8.1)
Total Bilirubin: 0.6 mg/dL (ref 0.2–1.2)

## 2015-01-29 LAB — TSH: TSH: 2.021 u[IU]/mL (ref 0.350–4.500)

## 2015-01-29 LAB — HEMOGLOBIN A1C
HEMOGLOBIN A1C: 6.3 % — AB (ref <5.7)
Mean Plasma Glucose: 134 mg/dL — ABNORMAL HIGH (ref <117)

## 2015-01-29 MED ORDER — HYDROCORTISONE ACETATE 25 MG RE SUPP: 25.0000 mg | Freq: Two times a day (BID) | RECTAL | Status: AC

## 2015-01-29 NOTE — Progress Notes (Signed)
Patient ID: Kaitlyn Townsend, female   DOB: Jun 05, 1945, 70 y.o.   MRN: [id number]  MEDICARE ANNUAL WELLNESS VISIT AND FOLLOW UP  Assessment:    1. Essential hypertension -cont DASH diet -diet and exercise -monitor at home -cont meds - TSH  2. Type 2 diabetes mellitus with diabetic chronic kidney disease, unspecified CKD stage, unspecified long term insulin use status ([facility])  - Hemoglobin A1c  3. Hyperlipidemia  - Lipid panel  4. Vitamin D deficiency -cont supplement  5. Medication management  - CBC with Differential/Platelet - BASIC METABOLIC PANEL WITH GFR - Hepatic function panel  6. Gastroesophageal reflux disease, esophagitis presence not specified -cont meds  7. BMI 29.0-29.9,adult -cont diet and exercise  8. Medicare annual wellness visit, subsequent -due again at next visit    Over 30 minutes of exam, counseling, chart review, and critical decision making was performed  Plan:   During the course of the visit the patient was educated and counseled about appropriate screening and preventive services including:    Pneumococcal vaccine   Influenza vaccine  Td vaccine  Prevnar 13  Screening electrocardiogram  Screening mammography  Bone densitometry screening  Colorectal cancer screening  Diabetes screening  Glaucoma screening  Nutrition counseling   Advanced directives: given info/requested copies  Conditions/risks identified: Diabetes is not at goal, ACE/ARB therapy: Yes. Urinary Incontinence is an issue: discussed non pharmacology and pharmacology options.  Fall risk: low- discussed PT, home fall assessment, medications.    Subjective:   Kaitlyn Townsend is a 70 y.o. female who presents for Medicare Annual Wellness Visit and 3 month follow up on hypertension, diabetes, hyperlipidemia, vitamin D def.  Date of last medicare wellness visit is unknown.   Her blood pressure has been controlled at home, today their BP is BP: 126/64  mmHg.She does workout. She denies chest pain, shortness of breath, dizziness.   She is on cholesterol medication and denies myalgias. Her cholesterol is at goal. The cholesterol last visit was:   Lab Results  Component Value Date   CHOL 151 10/20/2014   HDL 39* 10/20/2014   LDLCALC 73 10/20/2014   TRIG 194* 10/20/2014   CHOLHDL 3.9 10/20/2014   She has been working on diet and exercise for diabetes, and denies foot ulcerations, hyperglycemia, hypoglycemia , increased appetite, nausea, paresthesia of the feet, polydipsia, polyuria, visual disturbances, vomiting and weight loss. Last A1C in the office was:  Lab Results  Component Value Date   HGBA1C 6.5* 10/20/2014   Last GFR NonAA   Lab Results  Component Value Date   GFRNONAA 75 10/20/2014   AA  Lab Results  Component Value Date   GFRAA 86 10/20/2014   Patient is on Vitamin D supplement. Lab Results  Component Value Date   VD25OH 60 10/20/2014     She reports that she has been having some issues with her leg that she broke.  She was on topamax to help with the pain but she felt like it was causing her hair to fall out.  She stopped taking it.    Medication Review Current Outpatient Prescriptions on File Prior to Visit  Medication Sig Dispense Refill  . aspirin 81 MG chewable tablet Chew by mouth daily.    Marland Kitchen atorvastatin (LIPITOR) 80 MG tablet TAKE ONE TABLET BY MOUTH ONCE DAILY 30 tablet 0  . Blood Glucose Monitoring Suppl (ACCU-CHEK AVIVA PLUS) W/DEVICE KIT Check blood sugar 1 time daily-DX-R73.09 1 kit 0  . Cholecalciferol (VITAMIN D PO) Take 5,000  Int'l Units by mouth daily.    . enalapril (VASOTEC) 20 MG tablet TAKE ONE TABLET BY MOUTH ONCE DAILY 90 tablet 1  . estradiol (ESTRACE) 1 MG tablet TAKE ONE TABLET BY MOUTH ONCE DAILY 90 tablet 1  . glucose blood (ACCU-CHEK AVIVA PLUS) test strip Check blood sugar 1 time daily-DX-R73.09 100 each 12  . hydrochlorothiazide (HYDRODIURIL) 25 MG tablet TAKE ONE TABLET BY MOUTH  ONCE DAILY FOR BLOOD PRESSURE AND  FLUID 90 tablet 3  . Lancets (ACCU-CHEK SOFT TOUCH) lancets Check blood sugar 1 time daily-DX-R73.09 100 each 12  . MAGNESIUM PO Take 800 mg by mouth daily.     . methocarbamol (ROBAXIN) 500 MG tablet Take 1 tablet (500 mg total) by mouth 3 (three) times daily. 60 tablet 1  . Multiple Vitamins-Minerals (MULTIVITAMIN WITH MINERALS) tablet Take 1 tablet by mouth daily.    . Omega-3 Fatty Acids (FISH OIL PO) Take by mouth daily.    Marland Kitchen topiramate (TOPAMAX) 25 MG tablet TAKE ONE TO TWO TABLETS BY MOUTH AT NIGHT (Patient not taking: Reported on 01/29/2015) 180 tablet 1   No current facility-administered medications on file prior to visit.    Current Problems (verified) Patient Active Problem List   Diagnosis Date Noted  . Medicare annual wellness visit, subsequent 10/20/2014  . BMI 29.0-29.9,adult 10/20/2014  . 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 Medication management 02/13/2013  . Hyperlipidemia   . Hypertension   . Prediabetes   . Vitamin D deficiency   . GERD (gastroesophageal reflux disease)     Screening Tests Immunization History  Administered Date(s) Administered  . Influenza Split 10/31/2012  . Influenza-Unspecified 09/26/2014  . Pneumococcal Conjugate-13 03/09/2014  . Td 08/09/2012  . Zoster 08/14/2012    Preventative care: Last colonoscopy: 2012 Last mammogram: 08/2014 [id number], is requesting to have that done around her physical  Prior vaccinations: TD or Tdap: 2014  Influenza: 2016  Pneumococcal: 2016 Prevnar13: 2016 Shingles/Zostavax: 2014  Names of Other Physician/Practitioners you currently use: 1. [facility]- here for primary care 2. Dr. Aron Baba, and Dr. Tora Perches, eye doctor, last visit yearly 3. Dr. Deatra Ina, dentist, last visit twice yearly Patient Care Team: Unk Pinto, MD as PCP - General (Internal Medicine) Inda Castle, MD as Consulting Physician  (Gastroenterology)  Past Surgical History  Procedure Laterality Date  . Knee surgery Left 09/2009  . Abdominal hysterectomy Bilateral 1977    bso   Family History  Problem Relation Age of Onset  . Heart disease Mother   . Ulcers Father   . Heart disease Brother   . Hypertension Brother   . Kidney disease Brother    Social History  Substance Use Topics  . Smoking status: Former Smoker    Types: Cigarettes    Quit date: 01/16/1994  . Smokeless tobacco: None  . Alcohol Use: Yes     Comment: ocassional    MEDICARE WELLNESS OBJECTIVES: Tobacco use: She does not smoke.  Patient is a former smoker. If yes, counseling given Alcohol Current alcohol use: social drinker Osteoporosis: postmenopausal estrogen deficiency, History of fracture in the past year: no Fall risk: Minimal risk Hearing: normal Visual acuity: normal,  does perform annual eye exam Diet: in general, a "healthy" diet  , well balanced Physical activity: Current Exercise Habits:: Home exercise routine, Type of exercise: walking, Time (Minutes): 35, Frequency (Times/Week): 3, Weekly Exercise (Minutes/Week): 105, Intensity: Mild Cardiac risk factors: Cardiac Risk Factors include: advanced age (>46mn, >  36 women);diabetes mellitus;dyslipidemia;family history of premature cardiovascular disease;hypertension;obesity (BMI >30kg/m2);sedentary lifestyle Depression/mood screen:   Depression screen [facility] 2/9 01/29/2015  Decreased Interest 0  Down, Depressed, Hopeless 0  PHQ - 2 Score 0    ADLs:  In your present state of health, do you have any difficulty performing the following activities: 01/29/2015 10/20/2014  Hearing? N N  Vision? N N  Difficulty concentrating or making decisions? N N  Walking or climbing stairs? N N  Dressing or bathing? N N  Doing errands, shopping? N N  Preparing Food and eating ? N -  Using the Toilet? N -  In the past six months, have you accidently leaked urine? N -  Do you have problems with  loss of bowel control? N -  Managing your Medications? N -  Managing your Finances? N -  Housekeeping or managing your Housekeeping? N -     Cognitive Testing  Alert? Yes  Normal Appearance?Yes  Oriented to person? Yes  Place? Yes   Time? Yes  Recall of three objects?  Yes  Can perform simple calculations? Yes  Displays appropriate judgment?Yes  Can read the correct time from a watch face?Yes  EOL planning: Does patient have an advance directive?: Yes Does patient want to make changes to advanced directive?: No - Patient declined Copy of advanced directive(s) in chart?: No - copy requested   Objective:   Today's Vitals   01/29/15 0906  BP: 126/64  Pulse: 72  Temp: 98.2 F (36.8 C)  TempSrc: Temporal  Resp: 16  Height: 5' 4.75" (1.645 m)  Weight: 165 lb (74.844 kg)   Body mass index is 27.66 kg/(m^2).  General appearance: alert, no distress, WD/WN,  female HEENT: normocephalic, sclerae anicteric, TMs pearly, nares patent, no discharge or erythema, pharynx normal Oral cavity: MMM, no lesions Neck: supple, no lymphadenopathy, no thyromegaly, no masses Heart: RRR, normal S1, S2, no murmurs Lungs: CTA bilaterally, no wheezes, rhonchi, or rales Abdomen: +bs, soft, non tender, non distended, no masses, no hepatomegaly, no splenomegaly Musculoskeletal: nontender, no swelling, no obvious deformity Extremities: no edema, no cyanosis, no clubbing Pulses: 2+ symmetric, upper and lower extremities, normal cap refill Neurological: alert, oriented x 3, CN2-12 intact, strength normal upper extremities and lower extremities, sensation normal throughout, DTRs 2+ throughout, no cerebellar signs, gait normal Psychiatric: normal affect, behavior normal, pleasant  Breast: defer Gyn: defer Rectal: defer   Medicare Attestation I have personally reviewed: The patient's medical and social history Their use of alcohol, tobacco or illicit drugs Their current medications and  supplements The patient's functional ability including ADLs,fall risks, home safety risks, cognitive, and hearing and visual impairment Diet and physical activities Evidence for depression or mood disorders  The patient's weight, height, BMI, and visual acuity have been recorded in the chart.  I have made referrals, counseling, and provided education to the patient based on review of the above and I have provided the patient with a written personalized care plan for preventive services.     Starlyn Skeans, PA-C   01/29/2015

## 2015-01-29 NOTE — Patient Instructions (Signed)
Pregabalin capsules What is this medicine? PREGABALIN (pre GAB a lin) is used to treat nerve pain from diabetes, shingles, spinal cord injury, and fibromyalgia. It is also used to control seizures in epilepsy. This medicine may be used for other purposes; ask your health care provider or pharmacist if you have questions. What should I tell my health care provider before I take this medicine? They need to know if you have any of these conditions: -bleeding problems -heart disease, including heart failure -history of alcohol or drug abuse -kidney disease -suicidal thoughts, plans, or attempt; a previous suicide attempt by you or a family member -an unusual or allergic reaction to pregabalin, gabapentin, other medicines, foods, dyes, or preservatives -pregnant or trying to get pregnant or trying to conceive with your partner -breast-feeding How should I use this medicine? Take this medicine by mouth with a glass of water. Follow the directions on the prescription label. You can take this medicine with or without food. Take your doses at regular intervals. Do not take your medicine more often than directed. Do not stop taking except on your doctor's advice. A special MedGuide will be given to you by the pharmacist with each prescription and refill. Be sure to read this information carefully each time. Talk to your pediatrician regarding the use of this medicine in children. Special care may be needed. Overdosage: If you think you have taken too much of this medicine contact a poison control center or emergency room at once. NOTE: This medicine is only for you. Do not share this medicine with others. What if I miss a dose? If you miss a dose, take it as soon as you can. If it is almost time for your next dose, take only that dose. Do not take double or extra doses. What may interact with this medicine? -alcohol -certain medicines for blood pressure like captopril, enalapril, or  lisinopril -certain medicines for diabetes, like pioglitazone or rosiglitazone -certain medicines for anxiety or sleep -narcotic medicines for pain This list may not describe all possible interactions. Give your health care provider a list of all the medicines, herbs, non-prescription drugs, or dietary supplements you use. Also tell them if you smoke, drink alcohol, or use illegal drugs. Some items may interact with your medicine. What should I watch for while using this medicine? Tell your doctor or healthcare professional if your symptoms do not start to get better or if they get worse. Visit your doctor or health care professional for regular checks on your progress. Do not stop taking except on your doctor's advice. You may develop a severe reaction. Your doctor will tell you how much medicine to take. Wear a medical identification bracelet or chain if you are taking this medicine for seizures, and carry a card that describes your disease and details of your medicine and dosage times. You may get drowsy or dizzy. Do not drive, use machinery, or do anything that needs mental alertness until you know how this medicine affects you. Do not stand or sit up quickly, especially if you are an older patient. This reduces the risk of dizzy or fainting spells. Alcohol may interfere with the effect of this medicine. Avoid alcoholic drinks. If you have a heart condition, like congestive heart failure, and notice that you are retaining water and have swelling in your hands or feet, contact your health care provider immediately. The use of this medicine may increase the chance of suicidal thoughts or actions. Pay special attention to how you are   responding while on this medicine. Any worsening of mood, or thoughts of suicide or dying should be reported to your health care professional right away. This medicine has caused reduced sperm counts in some men. This may interfere with the ability to father a child. You  should talk to your doctor or health care professional if you are concerned about your fertility. Women who become pregnant while using this medicine for seizures may enroll in the [place] Antiepileptic Drug Pregnancy Registry by calling 1-888-233-2334. This registry collects information about the safety of antiepileptic drug use during pregnancy. What side effects may I notice from receiving this medicine? Side effects that you should report to your doctor or health care professional as soon as possible: -allergic reactions like skin rash, itching or hives, swelling of the face, lips, or tongue -breathing problems -changes in vision -chest pain -confusion -jerking or unusual movements of any part of your body -loss of memory -muscle pain, tenderness, or weakness -suicidal thoughts or other mood changes -swelling of the ankles, feet, hands -unusual bruising or bleeding Side effects that usually do not require medical attention (Report these to your doctor or health care professional if they continue or are bothersome.): -dizziness -drowsiness -dry mouth -headache -nausea -tremors -trouble sleeping -weight gain This list may not describe all possible side effects. Call your doctor for medical advice about side effects. You may report side effects to FDA at 1-800-FDA-1088. Where should I keep my medicine? Keep out of the reach of children. This medicine can be abused. Keep your medicine in a safe place to protect it from theft. Do not share this medicine with anyone. Selling or giving away this medicine is dangerous and against the law. This medicine may cause accidental overdose and death if it taken by other adults, children, or pets. Mix any unused medicine with a substance like cat litter or coffee grounds. Then throw the medicine away in a sealed container like a sealed bag or a coffee can with a lid. Do not use the medicine after the expiration date. Store at room temperature  between 15 and 30 degrees C (59 and 86 degrees F). NOTE: This sheet is a summary. It may not cover all possible information. If you have questions about this medicine, talk to your doctor, pharmacist, or health care provider.    2016, Elsevier/Gold Standard. (2013-02-28 15:38:53)  

## 2015-02-22 ENCOUNTER — Encounter: Payer: Self-pay | Admitting: Gastroenterology

## 2015-04-20 ENCOUNTER — Other Ambulatory Visit: Payer: Self-pay | Admitting: Internal Medicine

## 2015-05-05 ENCOUNTER — Ambulatory Visit (INDEPENDENT_AMBULATORY_CARE_PROVIDER_SITE_OTHER): Payer: Medicare Other | Admitting: Physician Assistant

## 2015-05-05 ENCOUNTER — Encounter: Payer: Self-pay | Admitting: Physician Assistant

## 2015-05-05 ENCOUNTER — Ambulatory Visit: Payer: Self-pay | Admitting: Internal Medicine

## 2015-05-05 VITALS — BP 140/70 | HR 77 | Temp 98.1°F | Resp 16 | Ht 64.75 in | Wt 168.8 lb

## 2015-05-05 DIAGNOSIS — E559 Vitamin D deficiency, unspecified: Secondary | ICD-10-CM | POA: Diagnosis not present

## 2015-05-05 DIAGNOSIS — I10 Essential (primary) hypertension: Secondary | ICD-10-CM

## 2015-05-05 DIAGNOSIS — E785 Hyperlipidemia, unspecified: Secondary | ICD-10-CM | POA: Diagnosis not present

## 2015-05-05 DIAGNOSIS — E1122 Type 2 diabetes mellitus with diabetic chronic kidney disease: Secondary | ICD-10-CM

## 2015-05-05 DIAGNOSIS — Z79899 Other long term (current) drug therapy: Secondary | ICD-10-CM

## 2015-05-05 LAB — HEPATIC FUNCTION PANEL
ALBUMIN: 3.7 g/dL (ref 3.6–5.1)
ALT: 28 U/L (ref 6–29)
AST: 27 U/L (ref 10–35)
Alkaline Phosphatase: 65 U/L (ref 33–130)
BILIRUBIN TOTAL: 0.5 mg/dL (ref 0.2–1.2)
Bilirubin, Direct: 0.1 mg/dL (ref <=0.2)
Indirect Bilirubin: 0.4 mg/dL (ref 0.2–1.2)
Total Protein: 6.8 g/dL (ref 6.1–8.1)

## 2015-05-05 LAB — BASIC METABOLIC PANEL WITH GFR
BUN: 12 mg/dL (ref 7–25)
CHLORIDE: 104 mmol/L (ref 98–110)
CO2: 28 mmol/L (ref 20–31)
CREATININE: 0.78 mg/dL (ref 0.60–0.93)
Calcium: 8.9 mg/dL (ref 8.6–10.4)
GFR, Est African American: 89 mL/min (ref >=60)
GFR, Est Non African American: 77 mL/min (ref >=60)
Glucose, Bld: 112 mg/dL — ABNORMAL HIGH (ref 65–99)
Potassium: 3.8 mmol/L (ref 3.5–5.3)
Sodium: 140 mmol/L (ref 135–146)

## 2015-05-05 LAB — LIPID PANEL
Cholesterol: 165 mg/dL (ref 125–200)
HDL: 44 mg/dL — AB (ref >=46)
LDL Cholesterol: 89 mg/dL (ref <130)
Total CHOL/HDL Ratio: 3.8 Ratio (ref <=5.0)
Triglycerides: 158 mg/dL — ABNORMAL HIGH (ref <150)
VLDL: 32 mg/dL — ABNORMAL HIGH (ref <30)

## 2015-05-05 LAB — CBC WITH DIFFERENTIAL/PLATELET
BASOS ABS: 42 {cells}/uL (ref 0–200)
Basophils Relative: 1 %
EOS ABS: 126 {cells}/uL (ref 15–500)
Eosinophils Relative: 3 %
HCT: 39.3 % (ref 35.0–45.0)
Hemoglobin: 13.1 g/dL (ref 11.7–15.5)
LYMPHS PCT: 42 %
Lymphs Abs: 1764 cells/uL (ref 850–3900)
MCH: 28.2 pg (ref 27.0–33.0)
MCHC: 33.3 g/dL (ref 32.0–36.0)
MCV: 84.5 fL (ref 80.0–100.0)
MONOS PCT: 10 %
MPV: 9.3 fL (ref 7.5–12.5)
Monocytes Absolute: 420 cells/uL (ref 200–950)
NEUTROS ABS: 1848 {cells}/uL (ref 1500–7800)
NEUTROS PCT: 44 %
PLATELETS: 241 10*3/uL (ref 140–400)
RBC: 4.65 MIL/uL (ref 3.80–5.10)
RDW: 14.8 % (ref 11.0–15.0)
WBC: 4.2 10*3/uL (ref 3.8–10.8)

## 2015-05-05 LAB — HEMOGLOBIN A1C
HEMOGLOBIN A1C: 6.5 % — AB (ref <5.7)
MEAN PLASMA GLUCOSE: 140 mg/dL

## 2015-05-05 LAB — MAGNESIUM: MAGNESIUM: 1.9 mg/dL (ref 1.5–2.5)

## 2015-05-05 LAB — TSH: TSH: 1.54 m[IU]/L

## 2015-05-05 NOTE — Addendum Note (Signed)
Addended by: Vicie Mutters R on: 05/05/2015 09:58 AM   Modules accepted: Orders

## 2015-05-05 NOTE — Progress Notes (Signed)
Assessment and Plan:  1. Hypertension -Continue medication, monitor blood pressure at home. Continue DASH diet.  Reminder to go to the ER if any CP, SOB, nausea, dizziness, severe HA, changes vision/speech, left arm numbness and tingling and jaw pain.  2. Cholesterol -Continue diet and exercise. Check cholesterol.   3. Prediabetes  -Continue diet and exercise. Check A1C  4. Vitamin D Def - check level and continue medications.  5. Neuropathy/leg pain from previous injury topamax helped but hair was falling out, lyrica did not help but only did '50mg'$  at night, will try to increase.     Continue diet and meds as discussed. Further disposition pending results of labs. Over 30 minutes of exam, counseling, chart review, and critical decision making was performed  HPI 70 y.o. female  presents for 3 month follow up on hypertension, cholesterol, prediabetes, and vitamin D deficiency.   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 She denies chest pain, shortness of breath, dizziness.  She is on cholesterol medication and denies myalgias. Her cholesterol is at goal. The cholesterol last visit was:   Lab Results  Component Value Date   CHOL 179 01/29/2015   HDL 46 01/29/2015   LDLCALC 97 01/29/2015   TRIG 181* 01/29/2015   CHOLHDL 3.9 01/29/2015    She has been working on diet and exercise for prediabetes x 2011, last visit her A1C was in the DM range for the first time, and denies paresthesia of the feet, polydipsia, polyuria and visual disturbances. Last A1C in the office was:  Lab Results  Component Value Date   HGBA1C 6.3* 01/29/2015   Patient is on Vitamin D supplement.   Lab Results  Component Value Date   VD25OH 60 10/20/2014     She broke her left leg in 2011 and states she has been having a lot of pain in that leg, has tried robaxin without help. It is worse at night,  occ during the day.   Son does not live here, has LD, and depression, states he is not doing well.   BMI is Body mass index is 28.3 kg/(m^2)., she is working on diet and exercise. Wt Readings from Last 3 Encounters:  05/05/15 168 lb 12.8 oz (76.567 kg)  01/29/15 165 lb (74.844 kg)  10/20/14 163 lb 9.6 oz (74.208 kg)    Current Medications:  Current Outpatient Prescriptions on File Prior to Visit  Medication Sig Dispense Refill  . aspirin 81 MG chewable tablet Chew by mouth daily.    Marland Kitchen atorvastatin (LIPITOR) 80 MG tablet TAKE ONE TABLET BY MOUTH ONCE DAILY 30 tablet 0  . Blood Glucose Monitoring Suppl (ACCU-CHEK AVIVA PLUS) W/DEVICE KIT Check blood sugar 1 time daily-DX-R73.09 1 kit 0  . Cholecalciferol (VITAMIN D PO) Take 5,000 Int'l Units by mouth daily.    . enalapril (VASOTEC) 20 MG tablet TAKE ONE TABLET BY MOUTH ONCE DAILY 90 tablet 1  . estradiol (ESTRACE) 1 MG tablet TAKE ONE TABLET BY MOUTH ONCE DAILY 90 tablet 1  . glucose blood (ACCU-CHEK AVIVA PLUS) test strip Check blood sugar 1 time daily-DX-R73.09 100 each 12  . hydrochlorothiazide (HYDRODIURIL) 25 MG tablet TAKE ONE TABLET BY MOUTH ONCE DAILY FOR BLOOD PRESSURE AND  FLUID 90 tablet 3  . hydrocortisone (ANUSOL-HC) 25 MG suppository Place 1 suppository (25 mg total) rectally 2 (two) times daily. For 7 days  24 suppository 3  . Lancets (ACCU-CHEK SOFT TOUCH) lancets Check blood sugar 1 time daily-DX-R73.09 100 each 12  . MAGNESIUM PO Take 800 mg by mouth daily.     . methocarbamol (ROBAXIN) 500 MG tablet Take 1 tablet (500 mg total) by mouth 3 (three) times daily. 60 tablet 1  . Multiple Vitamins-Minerals (MULTIVITAMIN WITH MINERALS) tablet Take 1 tablet by mouth daily.    . Omega-3 Fatty Acids (FISH OIL PO) Take by mouth daily.     No current facility-administered medications on file prior to visit.   Medical History:  Past Medical History  Diagnosis Date  . Hyperlipidemia   . Hypertension   . Prediabetes   . Vitamin  D deficiency   . Allergy   . GERD (gastroesophageal reflux disease)    Allergies: No Known Allergies   Review of Systems:  Review of Systems  Constitutional: Negative.   HENT: Negative.   Eyes: Negative.   Respiratory: Negative.   Cardiovascular: Negative.   Gastrointestinal: Negative.   Genitourinary: Negative.   Musculoskeletal: Positive for myalgias, back pain and joint pain. Negative for falls and neck pain.  Skin: Negative.   Neurological: Negative.  Negative for dizziness, tingling, tremors, sensory change, speech change, focal weakness, seizures and loss of consciousness.  Endo/Heme/Allergies: Negative.   Psychiatric/Behavioral: Negative.     Family history- Review and unchanged Social history- Review and unchanged Physical Exam: BP 140/70 mmHg  Pulse 77  Temp(Src) 98.1 F (36.7 C) (Temporal)  Resp 16  Ht 5' 4.75" (1.645 m)  Wt 168 lb 12.8 oz (76.567 kg)  BMI 28.30 kg/m2  SpO2 97% Wt Readings from Last 3 Encounters:  05/05/15 168 lb 12.8 oz (76.567 kg)  01/29/15 165 lb (74.844 kg)  10/20/14 163 lb 9.6 oz (74.208 kg)   General Appearance: Well nourished, in no apparent distress. Eyes: PERRLA, EOMs, conjunctiva no swelling or erythema Sinuses: No Frontal/maxillary tenderness ENT/Mouth: Ext aud canals clear, TMs without erythema, bulging. No erythema, swelling, or exudate on post pharynx.  Tonsils not swollen or erythematous. Hearing normal.  Neck: Supple, thyroid normal.  Respiratory: Respiratory effort normal, BS equal bilaterally without rales, rhonchi, wheezing or stridor.  Cardio: RRR with no MRGs. Brisk peripheral pulses without edema.  Abdomen: Soft, + BS,  Non tender, no guarding, rebound, hernias, masses. Lymphatics: Non tender without lymphadenopathy.  Musculoskeletal: Full ROM, 5/5 strength, Normal gait. Negative left SI tenderness, negative straight leg raise, good reflexes, pulses, and strength bilateral legs. Well healing scar on left lower lateral  leg.  Skin: Warm, dry without rashes, lesions, ecchymosis.  Neuro: Cranial nerves intact. Normal muscle tone, no cerebellar symptoms. Psych: Awake and oriented X 3, normal affect, Insight and Judgment appropriate.    Vicie Mutters, PA-C 9:39 AM [facility]

## 2015-05-05 NOTE — Patient Instructions (Signed)
Can take the lyrica samples for nerve pain. It can make you sleepy so we suggest trying it at night first and please plan to not drive or do anything strenuous. Also please do not take this medication with alcohol.  Start out 1 pill at night before bed, can increase to 2 pills at night before bed. Please call the office if you have any side effects.   Can take 3 pills a day however you would like  Some examples: - 1 breakfast, lunch, bedtime. - 1 at breakfast, 2 at bed time  How should I use this medicine? Take this medicine by mouth with a glass of water. Follow the directions on the prescription label. You can take this medicine with or without food. Take your doses at regular intervals. Do not take your medicine more often than directed. Do not stop taking except on your doctor's advice.  What if I miss a dose? If you miss a dose, take it as soon as you can. If it is almost time for your next dose, take only that dose. Do not take double or extra doses.  What should I watch for while using this medicine? Tell your doctor or healthcare professional if your symptoms do not start to get better or if they get worse.   You may get drowsy or dizzy. Do not drive, use machinery, or do anything that needs mental alertness until you know how this medicine affects you. Do not stand or sit up quickly, especially if you are an older patient. This reduces the risk of dizzy or fainting spells. Alcohol may interfere with the effect of this medicine. Avoid alcoholic drinks. If you have a heart condition, like congestive heart failure, and notice that you are retaining water and have swelling in your hands or feet, contact your health care provider immediately.  What side effects may I notice from receiving this medicine? Side effects that you should report to your doctor or health care professional as soon as possible and are very rare: -allergic reactions like skin rash, itching or hives, swelling of the  face, lips, or tongue -breathing problems -changes in vision -jerking or unusual movements of any part of your body -suicidal thoughts or other mood changes -swelling of the ankles, feet, hands -unusual bruising or bleeding  Side effects that usually do not require medical attention (Report these to your doctor or health care professional if they continue or are bothersome.): -dizziness -drowsiness -dry mouth -nausea -tremors

## 2015-05-06 LAB — VITAMIN D 25 HYDROXY (VIT D DEFICIENCY, FRACTURES): VIT D 25 HYDROXY: 56 ng/mL (ref 30–100)

## 2015-05-31 ENCOUNTER — Other Ambulatory Visit: Payer: Self-pay | Admitting: Internal Medicine

## 2015-06-08 ENCOUNTER — Other Ambulatory Visit: Payer: Self-pay | Admitting: Internal Medicine

## 2015-07-05 ENCOUNTER — Other Ambulatory Visit: Payer: Self-pay | Admitting: Internal Medicine

## 2015-07-25 ENCOUNTER — Encounter: Payer: Self-pay | Admitting: *Deleted

## 2015-08-06 ENCOUNTER — Ambulatory Visit (INDEPENDENT_AMBULATORY_CARE_PROVIDER_SITE_OTHER): Payer: Medicare Other | Admitting: Internal Medicine

## 2015-08-06 ENCOUNTER — Encounter: Payer: Self-pay | Admitting: Internal Medicine

## 2015-08-06 VITALS — BP 110/64 | HR 70 | Temp 98.2°F | Resp 16 | Ht 64.75 in | Wt 165.0 lb

## 2015-08-06 DIAGNOSIS — Z23 Encounter for immunization: Secondary | ICD-10-CM

## 2015-08-06 DIAGNOSIS — E785 Hyperlipidemia, unspecified: Secondary | ICD-10-CM | POA: Diagnosis not present

## 2015-08-06 DIAGNOSIS — E559 Vitamin D deficiency, unspecified: Secondary | ICD-10-CM

## 2015-08-06 DIAGNOSIS — Z79899 Other long term (current) drug therapy: Secondary | ICD-10-CM | POA: Diagnosis not present

## 2015-08-06 DIAGNOSIS — I10 Essential (primary) hypertension: Secondary | ICD-10-CM | POA: Diagnosis not present

## 2015-08-06 DIAGNOSIS — Z1159 Encounter for screening for other viral diseases: Secondary | ICD-10-CM | POA: Diagnosis not present

## 2015-08-06 DIAGNOSIS — K219 Gastro-esophageal reflux disease without esophagitis: Secondary | ICD-10-CM | POA: Diagnosis not present

## 2015-08-06 DIAGNOSIS — E1122 Type 2 diabetes mellitus with diabetic chronic kidney disease: Secondary | ICD-10-CM

## 2015-08-06 DIAGNOSIS — Z6829 Body mass index (BMI) 29.0-29.9, adult: Secondary | ICD-10-CM | POA: Diagnosis not present

## 2015-08-06 LAB — CBC WITH DIFFERENTIAL/PLATELET
BASOS PCT: 1 %
Basophils Absolute: 37 cells/uL (ref 0–200)
Eosinophils Absolute: 111 cells/uL (ref 15–500)
Eosinophils Relative: 3 %
HEMATOCRIT: 39.9 % (ref 35.0–45.0)
Hemoglobin: 13.2 g/dL (ref 11.7–15.5)
LYMPHS ABS: 1554 {cells}/uL (ref 850–3900)
LYMPHS PCT: 42 %
MCH: 28 pg (ref 27.0–33.0)
MCHC: 33.1 g/dL (ref 32.0–36.0)
MCV: 84.7 fL (ref 80.0–100.0)
MONO ABS: 444 {cells}/uL (ref 200–950)
MPV: 9.5 fL (ref 7.5–12.5)
Monocytes Relative: 12 %
NEUTROS ABS: 1554 {cells}/uL (ref 1500–7800)
Neutrophils Relative %: 42 %
PLATELETS: 244 10*3/uL (ref 140–400)
RBC: 4.71 MIL/uL (ref 3.80–5.10)
RDW: 14.8 % (ref 11.0–15.0)
WBC: 3.7 10*3/uL — AB (ref 3.8–10.8)

## 2015-08-06 LAB — TSH: TSH: 1.78 mIU/L

## 2015-08-06 MED ORDER — IBUPROFEN-FAMOTIDINE 800-26.6 MG PO TABS: 1.0000 | ORAL_TABLET | Freq: Two times a day (BID) | ORAL | Status: DC

## 2015-08-06 MED ORDER — ATORVASTATIN CALCIUM 80 MG PO TABS: 80.0000 mg | ORAL_TABLET | Freq: Every day | ORAL | Status: DC

## 2015-08-06 NOTE — Progress Notes (Signed)
Assessment and Plan:  Hypertension:  -Continue medication -monitor blood pressure at home. -Continue DASH diet -Reminder to go to the ER if any CP, SOB, nausea, dizziness, severe HA, changes vision/speech, left arm numbness and tingling and jaw pain.  Cholesterol - Continue diet and exercise -Check cholesterol.   Diabetes with diabetic chronic kidney disease  -cont meds -monitor CBGs -Continue diet and exercise.  -Check A1C  Vitamin D Def -check level -continue medications.   BMI 27 -cont diet and exercise  Leg pain -duexis prescribed -may need to separate to ibuprofen and pepcid components for insurance coverage.     Continue diet and meds as discussed. Further disposition pending results of labs. Discussed med's effects and SE's.    HPI 70 y.o. female  presents for 3 month follow up with hypertension, hyperlipidemia, diabetes and vitamin D deficiency.   Her blood pressure has been controlled at home, today their BP is BP: 110/64 mmHg.She does workout. She denies chest pain, shortness of breath, dizziness.  She reports that she has her pressures checked for therapy.  She never reports that she has had high blood pressures in the past.     She is on cholesterol medication and denies myalgias. Her cholesterol is at goal. The cholesterol was:  05/05/2015: Cholesterol 165; HDL 44*; LDL Cholesterol 89; Triglycerides 158*   She has been working on diet and exercise for diabetes with diabetic chronic kidney disease, she is on bASA, she is on ACE/ARB, and denies  foot ulcerations, hyperglycemia, hypoglycemia , increased appetite, nausea, paresthesia of the feet, polydipsia, polyuria, visual disturbances, vomiting and weight loss. Last A1C was: 05/05/2015: Hgb A1c MFr Bld 6.5*.  She reports that her blood sugars are anywhere between 75-125.  She occasionally gets a reading above 125.  But that is rarely.     Patient is on Vitamin D supplement. 05/05/2015: Vit D, 25-Hydroxy 56  She  reports that she has been having some issues with her left leg bothering her.  She is taking her husband's duexis which she feels like really helps with her pain.  She is doing therapy and rehab for her leg.    On HRT therapy currently.  She is not having any hot flashes.  No leg swelling or cramping.    She is due for her mammogram in August.     Current Medications:  Current Outpatient Prescriptions on File Prior to Visit  Medication Sig Dispense Refill  . aspirin 81 MG chewable tablet Chew by mouth daily.    Marland Kitchen atorvastatin (LIPITOR) 80 MG tablet TAKE ONE TABLET BY MOUTH ONCE DAILY 30 tablet 0  . Blood Glucose Monitoring Suppl (ACCU-CHEK AVIVA PLUS) W/DEVICE KIT Check blood sugar 1 time daily-DX-R73.09 1 kit 0  . Cholecalciferol (VITAMIN D PO) Take 5,000 Int'l Units by mouth daily.    . enalapril (VASOTEC) 20 MG tablet TAKE ONE TABLET BY MOUTH ONCE DAILY 90 tablet 1  . estradiol (ESTRACE) 1 MG tablet TAKE ONE TABLET BY MOUTH ONCE DAILY 90 tablet 1  . glucose blood (ACCU-CHEK AVIVA PLUS) test strip Check blood sugar 1 time daily-DX-R73.09 100 each 12  . hydrochlorothiazide (HYDRODIURIL) 25 MG tablet TAKE ONE TABLET BY MOUTH ONCE DAILY FOR BLOOD PRESSURE AND  FLUID 90 tablet 0  . hydrocortisone (ANUSOL-HC) 25 MG suppository Place 1 suppository (25 mg total) rectally 2 (two) times daily. For 7 days 24 suppository 3  . Lancets (ACCU-CHEK SOFT TOUCH) lancets Check blood sugar 1 time daily-DX-R73.09 100 each 12  .  MAGNESIUM PO Take 800 mg by mouth daily.     . Multiple Vitamins-Minerals (MULTIVITAMIN WITH MINERALS) tablet Take 1 tablet by mouth daily.    . Omega-3 Fatty Acids (FISH OIL PO) Take by mouth daily.     No current facility-administered medications on file prior to visit.   Medical History:  Past Medical History  Diagnosis Date  . Hyperlipidemia   . Hypertension   . Prediabetes   . Vitamin D deficiency   . Allergy   . GERD (gastroesophageal reflux disease)    Allergies: No  Known Allergies   Review of Systems:  Review of Systems  Constitutional: Negative for fever, chills and malaise/fatigue.  HENT: Negative for congestion, ear pain and sore throat.   Eyes: Negative.   Respiratory: Negative for cough, shortness of breath and wheezing.   Cardiovascular: Negative for chest pain, palpitations and leg swelling.  Gastrointestinal: Negative for heartburn, abdominal pain, diarrhea, constipation, blood in stool and melena.  Genitourinary: Negative.   Skin: Negative.   Neurological: Negative for dizziness, sensory change, loss of consciousness and headaches.  Psychiatric/Behavioral: Negative for depression. The patient is not nervous/anxious and does not have insomnia.     Family history- Review and unchanged  Social history- Review and unchanged  Physical Exam: BP 110/64 mmHg  Pulse 70  Temp(Src) 98.2 F (36.8 C) (Temporal)  Resp 16  Ht 5' 4.75" (1.645 m)  Wt 165 lb (74.844 kg)  BMI 27.66 kg/m2 Wt Readings from Last 3 Encounters:  08/06/15 165 lb (74.844 kg)  05/05/15 168 lb 12.8 oz (76.567 kg)  01/29/15 165 lb (74.844 kg)   General Appearance: Well nourished well developed, non-toxic appearing, in no apparent distress. Eyes: PERRLA, EOMs, conjunctiva no swelling or erythema ENT/Mouth: Ear canals clear with no erythema, swelling, or discharge.  TMs normal bilaterally, oropharynx clear, moist, with no exudate.   Neck: Supple, thyroid normal, no JVD, no cervical adenopathy.  Respiratory: Respiratory effort normal, breath sounds clear A&P, no wheeze, rhonchi or rales noted.  No retractions, no accessory muscle usage Cardio: RRR with no MRGs. No noted edema.  Abdomen: Soft, + BS.  Non tender, no guarding, rebound, hernias, masses. Musculoskeletal: Full ROM, 5/5 strength, Normal gait Skin: Warm, dry without rashes, lesions, ecchymosis.  Neuro: Awake and oriented X 3, Cranial nerves intact. No cerebellar symptoms.  Psych: normal affect, Insight and  Judgment appropriate.    Starlyn Skeans, PA-C 10:08 AM [facility]

## 2015-08-07 LAB — HEPATIC FUNCTION PANEL
ALK PHOS: 65 U/L (ref 33–130)
ALT: 25 U/L (ref 6–29)
AST: 27 U/L (ref 10–35)
Albumin: 3.7 g/dL (ref 3.6–5.1)
BILIRUBIN INDIRECT: 0.4 mg/dL (ref 0.2–1.2)
BILIRUBIN TOTAL: 0.5 mg/dL (ref 0.2–1.2)
Bilirubin, Direct: 0.1 mg/dL (ref <=0.2)
Total Protein: 6.6 g/dL (ref 6.1–8.1)

## 2015-08-07 LAB — BASIC METABOLIC PANEL WITH GFR
BUN: 13 mg/dL (ref 7–25)
CHLORIDE: 102 mmol/L (ref 98–110)
CO2: 23 mmol/L (ref 20–31)
Calcium: 9 mg/dL (ref 8.6–10.4)
Creat: 0.74 mg/dL (ref 0.60–0.93)
GFR, EST NON AFRICAN AMERICAN: 82 mL/min (ref >=60)
GLUCOSE: 105 mg/dL — AB (ref 65–99)
POTASSIUM: 3.9 mmol/L (ref 3.5–5.3)
Sodium: 141 mmol/L (ref 135–146)

## 2015-08-07 LAB — LIPID PANEL
CHOL/HDL RATIO: 3.2 ratio (ref <=5.0)
Cholesterol: 145 mg/dL (ref 125–200)
HDL: 46 mg/dL (ref >=46)
LDL CALC: 69 mg/dL (ref <130)
Triglycerides: 149 mg/dL (ref <150)
VLDL: 30 mg/dL (ref <30)

## 2015-08-07 LAB — HEPATITIS C ANTIBODY: HCV Ab: NEGATIVE

## 2015-08-07 LAB — HEMOGLOBIN A1C
HEMOGLOBIN A1C: 6.3 % — AB (ref <5.7)
Mean Plasma Glucose: 134 mg/dL

## 2015-08-18 ENCOUNTER — Other Ambulatory Visit: Payer: Self-pay | Admitting: Internal Medicine

## 2015-08-18 DIAGNOSIS — Z1231 Encounter for screening mammogram for malignant neoplasm of breast: Secondary | ICD-10-CM

## 2015-09-13 ENCOUNTER — Other Ambulatory Visit: Payer: Self-pay | Admitting: Internal Medicine

## 2015-09-13 ENCOUNTER — Ambulatory Visit
Admission: RE | Admit: 2015-09-13 | Discharge: 2015-09-13 | Disposition: A | Discharge status: Home / Self Care | Payer: Medicare Other | Source: Ambulatory Visit | Attending: Internal Medicine | Admitting: Internal Medicine

## 2015-09-13 DIAGNOSIS — Z1231 Encounter for screening mammogram for malignant neoplasm of breast: Secondary | ICD-10-CM

## 2015-11-16 ENCOUNTER — Encounter: Payer: Self-pay | Admitting: Internal Medicine

## 2015-11-16 ENCOUNTER — Ambulatory Visit (INDEPENDENT_AMBULATORY_CARE_PROVIDER_SITE_OTHER): Payer: Medicare Other | Admitting: Internal Medicine

## 2015-11-16 VITALS — BP 154/90 | HR 76 | Temp 97.1°F | Resp 16 | Ht 64.5 in | Wt 169.2 lb

## 2015-11-16 DIAGNOSIS — Z0000 Encounter for general adult medical examination without abnormal findings: Secondary | ICD-10-CM

## 2015-11-16 DIAGNOSIS — Z1212 Encounter for screening for malignant neoplasm of rectum: Secondary | ICD-10-CM

## 2015-11-16 DIAGNOSIS — I10 Essential (primary) hypertension: Secondary | ICD-10-CM | POA: Diagnosis not present

## 2015-11-16 DIAGNOSIS — Z136 Encounter for screening for cardiovascular disorders: Secondary | ICD-10-CM | POA: Diagnosis not present

## 2015-11-16 DIAGNOSIS — Z0001 Encounter for general adult medical examination with abnormal findings: Secondary | ICD-10-CM

## 2015-11-16 DIAGNOSIS — Z79899 Other long term (current) drug therapy: Secondary | ICD-10-CM

## 2015-11-16 DIAGNOSIS — E782 Mixed hyperlipidemia: Secondary | ICD-10-CM

## 2015-11-16 DIAGNOSIS — N3281 Overactive bladder: Secondary | ICD-10-CM

## 2015-11-16 DIAGNOSIS — E1122 Type 2 diabetes mellitus with diabetic chronic kidney disease: Secondary | ICD-10-CM

## 2015-11-16 DIAGNOSIS — E559 Vitamin D deficiency, unspecified: Secondary | ICD-10-CM

## 2015-11-16 DIAGNOSIS — K219 Gastro-esophageal reflux disease without esophagitis: Secondary | ICD-10-CM

## 2015-11-16 LAB — LIPID PANEL
CHOL/HDL RATIO: 3.5 ratio (ref <=5.0)
Cholesterol: 160 mg/dL (ref 125–200)
HDL: 46 mg/dL (ref >=46)
LDL CALC: 85 mg/dL (ref <130)
Triglycerides: 145 mg/dL (ref <150)
VLDL: 29 mg/dL (ref <30)

## 2015-11-16 LAB — BASIC METABOLIC PANEL WITH GFR
BUN: 10 mg/dL (ref 7–25)
CHLORIDE: 103 mmol/L (ref 98–110)
CO2: 26 mmol/L (ref 20–31)
CREATININE: 0.79 mg/dL (ref 0.60–0.93)
Calcium: 9.2 mg/dL (ref 8.6–10.4)
GFR, Est African American: 87 mL/min (ref >=60)
GFR, Est Non African American: 76 mL/min (ref >=60)
Glucose, Bld: 134 mg/dL — ABNORMAL HIGH (ref 65–99)
POTASSIUM: 4.1 mmol/L (ref 3.5–5.3)
SODIUM: 141 mmol/L (ref 135–146)

## 2015-11-16 LAB — HEPATIC FUNCTION PANEL
ALK PHOS: 62 U/L (ref 33–130)
ALT: 20 U/L (ref 6–29)
AST: 22 U/L (ref 10–35)
Albumin: 4 g/dL (ref 3.6–5.1)
BILIRUBIN DIRECT: 0.1 mg/dL (ref <=0.2)
BILIRUBIN INDIRECT: 0.5 mg/dL (ref 0.2–1.2)
Total Bilirubin: 0.6 mg/dL (ref 0.2–1.2)
Total Protein: 6.9 g/dL (ref 6.1–8.1)

## 2015-11-16 LAB — CBC WITH DIFFERENTIAL/PLATELET
BASOS PCT: 1 %
Basophils Absolute: 49 cells/uL (ref 0–200)
EOS PCT: 2 %
Eosinophils Absolute: 98 cells/uL (ref 15–500)
HCT: 40.6 % (ref 35.0–45.0)
Hemoglobin: 13.5 g/dL (ref 11.7–15.5)
Lymphocytes Relative: 31 %
Lymphs Abs: 1519 cells/uL (ref 850–3900)
MCH: 28.2 pg (ref 27.0–33.0)
MCHC: 33.3 g/dL (ref 32.0–36.0)
MCV: 84.9 fL (ref 80.0–100.0)
MONOS PCT: 9 %
MPV: 10 fL (ref 7.5–12.5)
Monocytes Absolute: 441 cells/uL (ref 200–950)
NEUTROS ABS: 2793 {cells}/uL (ref 1500–7800)
Neutrophils Relative %: 57 %
PLATELETS: 267 10*3/uL (ref 140–400)
RBC: 4.78 MIL/uL (ref 3.80–5.10)
RDW: 14.2 % (ref 11.0–15.0)
WBC: 4.9 10*3/uL (ref 3.8–10.8)

## 2015-11-16 LAB — HEMOGLOBIN A1C
HEMOGLOBIN A1C: 6.2 % — AB (ref <5.7)
Mean Plasma Glucose: 131 mg/dL

## 2015-11-16 LAB — TSH: TSH: 2.97 m[IU]/L

## 2015-11-16 NOTE — Patient Instructions (Signed)

## 2015-11-16 NOTE — Progress Notes (Signed)
[facility] Unk Pinto, M.D.    Uvaldo Bristle. Silverio Lay, P.A.-C      Starlyn Skeans, P.A.-C  [facility]                [address] [place] [address] Telephone [phone] Telefax [phone]  Annual Screening/Preventative Visit & Comprehensive Evaluation &  Examination     This very nice 70 y.o. MBF presents for a Screening/Preventative Visit & comprehensive evaluation and management of multiple medical co-morbidities.  Patient has been followed for HTN, T2_NIDDM  W/CKD2, Hyperlipidemia and Vitamin D Deficiency.      HTN predates circa 1995. Patient's BP has been controlled at home and patient denies any cardiac symptoms as chest pain, palpitations, shortness of breath, dizziness or ankle swelling. Today's BP is elevated at 154/90 and dropped to 142 /84 on recheck.       Patient's hyperlipidemia is controlled with diet and medications. Patient denies myalgias or other medication SE's. Last lipids were at goal: Lab Results  Component Value Date   CHOL 145 08/06/2015   HDL 46 08/06/2015   LDLCALC 69 08/06/2015   TRIG 149 08/06/2015   CHOLHDL 3.2 08/06/2015      Patient has T2_NIDDM w/CKD2 predating since 2011 with Prediabetes (A1c 6.0%) and then 7.4% in June 2016. Patient is attempting to control this with diet.  She denies reactive hypoglycemic symptoms, visual blurring, diabetic polys, or paresthesias. Last A1c was still not at goal: Lab Results  Component Value Date   HGBA1C 6.3 (H) 08/06/2015      Finally, patient has history of Vitamin D Deficiency and last Vitamin D was  Lab Results  Component Value Date   VD25OH 49 05/05/2015   Current Outpatient Prescriptions on File Prior to Visit  Medication Sig  . aspirin 81 MG chewable tablet Chew by mouth daily.  Marland Kitchen atorvastatin (LIPITOR) 80 MG tablet Take 1 tablet (80 mg total) by mouth daily.  . Blood Glucose Monitoring Suppl (ACCU-CHEK  AVIVA PLUS) W/DEVICE KIT Check blood sugar 1 time daily-DX-R73.09  . Cholecalciferol (VITAMIN D PO) Take 5,000 Int'l Units by mouth daily.  . enalapril (VASOTEC) 20 MG tablet TAKE ONE TABLET BY MOUTH ONCE DAILY  . estradiol (ESTRACE) 1 MG tablet TAKE ONE TABLET BY MOUTH ONCE DAILY  . glucose blood (ACCU-CHEK AVIVA PLUS) test strip Check blood sugar 1 time daily-DX-R73.09  . hydrochlorothiazide (HYDRODIURIL) 25 MG tablet TAKE ONE TABLET BY MOUTH ONCE DAILY FOR BLOOD PRESSURE AND  FLUID  . hydrocortisone (ANUSOL-HC) 25 MG suppository Place 1 suppository (25 mg total) rectally 2 (two) times daily. For 7 days  . Lancets (ACCU-CHEK SOFT TOUCH) lancets Check blood sugar 1 time daily-DX-R73.09  . MAGNESIUM PO Take 800 mg by mouth daily.   . Multiple Vitamins-Minerals (MULTIVITAMIN WITH MINERALS) tablet Take 1 tablet by mouth daily.  . Omega-3 Fatty Acids (FISH OIL PO) Take by mouth daily.   No current facility-administered medications on file prior to visit.    No Known Allergies Past Medical History:  Diagnosis Date  . Allergy   . GERD (gastroesophageal reflux disease)   . Hyperlipidemia   . Hypertension   . Prediabetes   . Vitamin D deficiency    Health Maintenance  Topic Date Due  . INFLUENZA VACCINE  08/17/2015  . FOOT EXAM  10/20/2015  . HEMOGLOBIN A1C  02/06/2016  . OPHTHALMOLOGY  EXAM  07/07/2016  . MAMMOGRAM  09/12/2017  . COLONOSCOPY  02/02/2020  . TETANUS/TDAP  08/10/2022  . DEXA SCAN  Completed  . ZOSTAVAX  Completed  . Hepatitis C Screening  Completed  . PNA vac Low Risk Adult  Completed   Immunization History  Administered Date(s) Administered  . Influenza Split 10/31/2012  . Influenza-Unspecified 09/26/2014, 10/06/2015  . Pneumococcal Conjugate-13 03/09/2014  . Pneumococcal Polysaccharide-23 08/06/2015  . Td 08/09/2012  . Zoster 08/14/2012   Past Surgical History:  Procedure Laterality Date  . ABDOMINAL HYSTERECTOMY Bilateral 1977   bso  . KNEE SURGERY Left  09/2009   Family History  Problem Relation Age of Onset  . Heart disease Mother   . Ulcers Father   . Heart disease Brother   . Hypertension Brother   . Kidney disease Brother    Social History  Substance Use Topics  . Smoking status: Former Smoker    Types: Cigarettes    Quit date: 01/16/1994  . Smokeless tobacco: Not on file  . Alcohol use Yes     Comment: ocassional    ROS Constitutional: Denies fever, chills, weight loss/gain, headaches, insomnia,  night sweats, and change in appetite. Does c/o fatigue. Eyes: Denies redness, blurred vision, diplopia, discharge, itchy, watery eyes.  ENT: Denies discharge, congestion, post nasal drip, epistaxis, sore throat, earache, hearing loss, dental pain, Tinnitus, Vertigo, Sinus pain, snoring.  Cardio: Denies chest pain, palpitations, irregular heartbeat, syncope, dyspnea, diaphoresis, orthopnea, PND, claudication, edema Respiratory: denies cough, dyspnea, DOE, pleurisy, hoarseness, laryngitis, wheezing.  Gastrointestinal: Denies dysphagia, heartburn, reflux, water brash, pain, cramps, nausea, vomiting, bloating, diarrhea, constipation, hematemesis, melena, hematochezia, jaundice, hemorrhoids  Genitourinary: Denies  nocturia, hesitancy, discharge, hematuria, flank pain. Does c/o suprapubic discomfort, dysuria, frequency and urge incontinence.  Breast: Breast lumps, nipple discharge, bleeding.  Musculoskeletal: Denies arthralgia, myalgia, stiffness, Jt. Swelling, pain, limp, and strain/sprain. Denies falls. Skin: Denies puritis, rash, hives, warts, acne, eczema, changing in skin lesion Neuro: No weakness, tremor, incoordination, spasms, paresthesia, pain Psychiatric: Denies confusion, memory loss, sensory loss. Denies Depression. Endocrine: Denies change in weight, skin, hair change, nocturia, and paresthesia, diabetic polys, visual blurring, hyper / hypo glycemic episodes.  Heme/Lymph: No excessive bleeding, bruising, enlarged lymph  nodes.  Physical Exam  BP 154/90 -> 142/84  P 76   T 97.1 F    R 16   Ht 5' 4.5"   Wt 169 lb 3.2 oz  BMI 28.59   General Appearance: Well nourished and in no apparent distress.  Eyes: PERRLA, EOMs, conjunctiva no swelling or erythema, normal fundi and vessels. Sinuses: No frontal/maxillary tenderness ENT/Mouth: EACs patent / TMs  nl. Nares clear without erythema, swelling, mucoid exudates. Oral hygiene is good. No erythema, swelling, or exudate. Tongue normal, non-obstructing. Tonsils not swollen or erythematous. Hearing normal.  Neck: Supple, thyroid normal. No bruits, nodes or JVD. Respiratory: Respiratory effort normal.  BS equal and clear bilateral without rales, rhonci, wheezing or stridor. Cardio: Heart sounds are normal with regular rate and rhythm and no murmurs, rubs or gallops. Peripheral pulses are normal and equal bilaterally without edema. No aortic or femoral bruits. Chest: symmetric with normal excursions and percussion. Breasts: Symmetric, without lumps, nipple discharge, retractions, or fibrocystic changes.  Abdomen: Flat, soft with bowel sounds active. Nontender, no guarding, rebound, hernias, masses, or organomegaly.  Lymphatics: Non tender without lymphadenopathy.  Musculoskeletal: Full ROM all peripheral extremities, joint stability, 5/5 strength, and normal gait. Skin: Warm and dry without rashes, lesions, cyanosis, clubbing or  ecchymosis.  Neuro: Cranial nerves intact, reflexes equal bilaterally. Normal muscle tone, no cerebellar symptoms. Sensation intact to touch, vibratory and Monofilament to the toes bilaterally.  Pysch: Alert and oriented X 3, normal affect, Insight and Judgment appropriate.   Assessment and Plan  1. Annual Preventative Screening Examination  - Microalbumin / creatinine urine ratio - EKG 12-Lead - POC Hemoccult Bld/Stl  - Urinalysis, Routine w reflex microscopic - HM DIABETES FOOT EXAM - LOW EXTREMITY NEUR EXAM DOCUM - CBC with  Differential/Platelet - BASIC METABOLIC PANEL WITH GFR - Hepatic function panel - Magnesium - Lipid panel - TSH - Hemoglobin A1c - Insulin, random - VITAMIN D 25 Hydroxy   2. Essential hypertension  - Microalbumin / creatinine urine ratio - EKG 12-Lead - TSH  3. 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 Vitamin D deficiency  - VITAMIN D 25 Hydroxy   6. Screening for rectal cancer  - POC Hemoccult Bld/Stl   7. Gastroesophageal reflux disease   8. Screening for ischemic heart disease   9. Medication management  - Urinalysis, Routine w reflex microscopic  - CBC with Differential/Platelet - BASIC METABOLIC PANEL WITH GFR - Hepatic function panel - Magnesium  10. OAB (overactive bladder)  - Ambulatory referral to Urology       Continue prudent diet as discussed, weight control, BP monitoring, regular exercise, and medications. Discussed med's effects and SE's. Screening labs and tests as requested with regular follow-up as recommended. Over 40 minutes of exam, counseling, chart review and high complex critical decision making was performed.

## 2015-11-17 LAB — URINALYSIS, ROUTINE W REFLEX MICROSCOPIC
Bilirubin Urine: NEGATIVE
Glucose, UA: NEGATIVE
HGB URINE DIPSTICK: NEGATIVE
Ketones, ur: NEGATIVE
NITRITE: POSITIVE — AB
PROTEIN: NEGATIVE
Specific Gravity, Urine: 1.012 (ref 1.001–1.035)
pH: 7.5 (ref 5.0–8.0)

## 2015-11-17 LAB — URINALYSIS, MICROSCOPIC ONLY
CRYSTALS: NONE SEEN [HPF]
Casts: NONE SEEN [LPF]
YEAST: NONE SEEN [HPF]

## 2015-11-17 LAB — MICROALBUMIN / CREATININE URINE RATIO
Creatinine, Urine: 98 mg/dL (ref 20–320)
MICROALB UR: 4.5 mg/dL
MICROALB/CREAT RATIO: 46 ug/mg{creat} — AB (ref <30)

## 2015-11-17 LAB — INSULIN, RANDOM: Insulin: 11 u[IU]/mL (ref 2.0–19.6)

## 2015-11-17 LAB — VITAMIN D 25 HYDROXY (VIT D DEFICIENCY, FRACTURES): Vit D, 25-Hydroxy: 60 ng/mL (ref 30–100)

## 2015-11-17 LAB — MAGNESIUM: Magnesium: 1.9 mg/dL (ref 1.5–2.5)

## 2015-11-29 ENCOUNTER — Other Ambulatory Visit: Payer: Self-pay | Admitting: Internal Medicine

## 2015-12-13 ENCOUNTER — Other Ambulatory Visit: Payer: Self-pay | Admitting: Physician Assistant

## 2016-01-12 ENCOUNTER — Other Ambulatory Visit: Payer: Self-pay | Admitting: Internal Medicine

## 2016-02-16 ENCOUNTER — Ambulatory Visit (INDEPENDENT_AMBULATORY_CARE_PROVIDER_SITE_OTHER): Payer: Medicare Other | Admitting: Internal Medicine

## 2016-02-16 VITALS — BP 130/84 | HR 84 | Temp 97.3°F | Resp 16 | Ht 64.5 in | Wt 175.6 lb

## 2016-02-16 DIAGNOSIS — E782 Mixed hyperlipidemia: Secondary | ICD-10-CM

## 2016-02-16 DIAGNOSIS — R6889 Other general symptoms and signs: Secondary | ICD-10-CM

## 2016-02-16 DIAGNOSIS — N200 Calculus of kidney: Secondary | ICD-10-CM | POA: Diagnosis not present

## 2016-02-16 DIAGNOSIS — Z79899 Other long term (current) drug therapy: Secondary | ICD-10-CM | POA: Diagnosis not present

## 2016-02-16 DIAGNOSIS — E559 Vitamin D deficiency, unspecified: Secondary | ICD-10-CM | POA: Diagnosis not present

## 2016-02-16 DIAGNOSIS — K219 Gastro-esophageal reflux disease without esophagitis: Secondary | ICD-10-CM | POA: Diagnosis not present

## 2016-02-16 DIAGNOSIS — I10 Essential (primary) hypertension: Secondary | ICD-10-CM | POA: Diagnosis not present

## 2016-02-16 DIAGNOSIS — Z6829 Body mass index (BMI) 29.0-29.9, adult: Secondary | ICD-10-CM

## 2016-02-16 DIAGNOSIS — N3281 Overactive bladder: Secondary | ICD-10-CM | POA: Diagnosis not present

## 2016-02-16 DIAGNOSIS — R7303 Prediabetes: Secondary | ICD-10-CM

## 2016-02-16 DIAGNOSIS — Z0001 Encounter for general adult medical examination with abnormal findings: Secondary | ICD-10-CM

## 2016-02-16 DIAGNOSIS — Z0000 Encounter for general adult medical examination without abnormal findings: Secondary | ICD-10-CM

## 2016-02-16 MED ORDER — TOPIRAMATE 25 MG PO TABS: 25.0000 mg | ORAL_TABLET | Freq: Every day | ORAL | 3 refills | Status: DC

## 2016-02-16 NOTE — Progress Notes (Deleted)
Assessment and Plan:  Hypertension:  -Continue medication,  -monitor blood pressure at home.  -Continue DASH diet.   -Reminder to go to the ER if any CP, SOB, nausea, dizziness, severe HA, changes vision/speech, left arm numbness and tingling, and jaw pain.  Cholesterol: -Continue diet and exercise.  -Check cholesterol.   Pre-diabetes: -Continue diet and exercise.  -Check A1C  Vitamin D Def: -check level -continue medications.   Continue diet and meds as discussed. Further disposition pending results of labs.  HPI 71 y.o. female  presents for 3 month follow up with hypertension, hyperlipidemia, prediabetes and vitamin D.   Her blood pressure has been controlled at home, today their BP is BP: 130/84.   She does workout. She denies chest pain, shortness of breath, dizziness.   She is on cholesterol medication and denies myalgias. Her cholesterol is at goal. The cholesterol last visit was:   Lab Results  Component Value Date   CHOL 160 11/16/2015   HDL 46 11/16/2015   LDLCALC 85 11/16/2015   TRIG 145 11/16/2015   CHOLHDL 3.5 11/16/2015     She has been working on diet and exercise for prediabetes, and denies foot ulcerations, hyperglycemia, hypoglycemia , increased appetite, nausea, paresthesia of the feet, polydipsia, polyuria, visual disturbances, vomiting and weight loss. Last A1C in the office was:  Lab Results  Component Value Date   HGBA1C 6.2 (H) 11/16/2015  She reports that she is checking her blood sugars at home and she is not having any sugars over the range of 120.  She reports that she is keeping a close eye on her diet.   Patient is on Vitamin D supplement.  Lab Results  Component Value Date   VD25OH 60 11/16/2015      She reports that she has stressed   She was seen at [facility] urology.  She was found to have a UTI and feels slightly better after finishing 2 rounds of antibiotics.  She was also started on 5 mg of vesicare.  No evidence of interstitial  cystitis.  She reports that the vesicare is helping a lot.  She was also told that she had a very small kidney stone   Current Medications:  Current Outpatient Prescriptions on File Prior to Visit  Medication Sig Dispense Refill  . aspirin 81 MG chewable tablet Chew by mouth daily.    Marland Kitchen atorvastatin (LIPITOR) 80 MG tablet Take 1 tablet (80 mg total) by mouth daily. 90 tablet 1  . Blood Glucose Monitoring Suppl (ACCU-CHEK AVIVA PLUS) W/DEVICE KIT Check blood sugar 1 time daily-DX-R73.09 1 kit 0  . Cholecalciferol (VITAMIN D PO) Take 5,000 Int'l Units by mouth daily.    . enalapril (VASOTEC) 20 MG tablet TAKE ONE TABLET BY MOUTH ONCE DAILY 90 tablet 1  . estradiol (ESTRACE) 1 MG tablet TAKE ONE TABLET BY MOUTH ONCE DAILY 90 tablet 1  . glucose blood (ACCU-CHEK AVIVA PLUS) test strip Check blood sugar 1 time daily-DX-R73.09 100 each 12  . hydrochlorothiazide (HYDRODIURIL) 25 MG tablet TAKE ONE TABLET BY MOUTH ONCE DAILY FOR  BLOOD  PRESSURE  AND  FLUID 90 tablet 1  . hydrocortisone (ANUSOL-HC) 25 MG suppository Place 1 suppository (25 mg total) rectally 2 (two) times daily. For 7 days 24 suppository 3  . Lancets (ACCU-CHEK SOFT TOUCH) lancets Check blood sugar 1 time daily-DX-R73.09 100 each 12  . MAGNESIUM PO Take 800 mg by mouth daily.     . Multiple Vitamins-Minerals (MULTIVITAMIN WITH MINERALS) tablet Take  1 tablet by mouth daily.    . Omega-3 Fatty Acids (FISH OIL PO) Take by mouth daily.     No current facility-administered medications on file prior to visit.     Medical History:  Past Medical History:  Diagnosis Date  . Allergy   . GERD (gastroesophageal reflux disease)   . Hyperlipidemia   . Hypertension   . Prediabetes   . Vitamin D deficiency     Allergies: No Known Allergies   Review of Systems:  Review of Systems  Constitutional: Negative for chills, fever and malaise/fatigue.  HENT: Negative for congestion, ear pain and sore throat.   Eyes: Negative.   Respiratory:  Negative for cough, shortness of breath and wheezing.   Cardiovascular: Negative for chest pain, palpitations and leg swelling.  Gastrointestinal: Negative for abdominal pain, blood in stool, constipation, diarrhea, heartburn and melena.  Genitourinary: Negative.   Skin: Negative.   Neurological: Negative for dizziness, sensory change, loss of consciousness and headaches.  Psychiatric/Behavioral: Negative for depression. The patient is not nervous/anxious and does not have insomnia.     Family history- Review and unchanged  Social history- Review and unchanged  Physical Exam: BP 130/84   Pulse 84   Temp 97.3 F (36.3 C)   Resp 16   Ht 5' 4.5" (1.638 m)   Wt 175 lb 9.6 oz (79.7 kg)   BMI 29.68 kg/m  Wt Readings from Last 3 Encounters:  02/16/16 175 lb 9.6 oz (79.7 kg)  11/16/15 169 lb 3.2 oz (76.7 kg)  08/06/15 165 lb (74.8 kg)    General Appearance: Well nourished well developed, in no apparent distress. Eyes: PERRLA, EOMs, conjunctiva no swelling or erythema ENT/Mouth: Ear canals normal without obstruction, swelling, erythma, discharge.  TMs normal bilaterally.  Oropharynx moist, clear, without exudate, or postoropharyngeal swelling. Neck: Supple, thyroid normal,no cervical adenopathy  Respiratory: Respiratory effort normal, Breath sounds clear A&P without rhonchi, wheeze, or rale.  No retractions, no accessory usage. Cardio: RRR with no MRGs. Brisk peripheral pulses without edema.  Abdomen: Soft, + BS,  Non tender, no guarding, rebound, hernias, masses. Musculoskeletal: Full ROM, 5/5 strength, Normal gait Skin: Warm, dry without rashes, lesions, ecchymosis.  Neuro: Awake and oriented X 3, Cranial nerves intact. Normal muscle tone, no cerebellar symptoms. Psych: Normal affect, Insight and Judgment appropriate.    Starlyn Skeans, PA-C 10:43 AM [facility]

## 2016-02-16 NOTE — Progress Notes (Signed)
Patient ID: Kaitlyn Townsend, female   DOB: 1945/11/21, 71 y.o.   MRN: [id number]  MEDICARE ANNUAL WELLNESS VISIT AND FOLLOW UP Assessment:    1. Nephrolithiasis -followed by [facility]  2. OAB (overactive bladder) -having relief with vesicare  3. Essential hypertension -well controlled -cont medications -dash diet -exercise as tolerated -monitor at home  4. Gastroesophageal reflux disease, esophagitis presence not specified -not currently on medications -tums prn  5. BMI 29.0-29.9,adult -recommended mild weight loss of 5-10 lbs  6. Mixed hyperlipidemia -cont lipitor -check lipid panel twice yearly  7. Medicare annual wellness visit, subsequent -due next year  8. Medication management -check every 6 months  9. Prediabetes -cont diet and exercise -no longer diabetic but did cross into the diabetic range at one time  10. Vitamin D deficiency -cont supplement   Over 30 minutes of exam, counseling, chart review, and critical decision making was performed  Future Appointments Date Time Provider [place]  05/26/2016 10:30 AM Unk Pinto, MD [facility] None  11/23/2016 10:00 AM Unk Pinto, MD [facility] None     Plan:   During the course of the visit the patient was educated and counseled about appropriate screening and preventive services including:    Pneumococcal vaccine   Influenza vaccine  Prevnar 13  Td vaccine  Screening electrocardiogram  Colorectal cancer screening  Diabetes screening  Glaucoma screening  Nutrition counseling    Subjective:  Kaitlyn Townsend is a 71 y.o. female who presents for Medicare Annual Wellness Visit and 3 month follow up for HTN, hyperlipidemia, diet controlled daibetes, and vitamin D Def.   Her blood pressure has been controlled at home, today their BP is BP: 130/84 She does not workout. She denies chest pain, shortness of breath, dizziness.   She reports that she is taking care of her  husband right now who is disabled due to low back pain and she does not have time to exercise.     She is on cholesterol medication and denies myalgias. Her cholesterol is at goal. The cholesterol last visit was:   Lab Results  Component Value Date   CHOL 160 11/16/2015   HDL 46 11/16/2015   LDLCALC 85 11/16/2015   TRIG 145 11/16/2015   CHOLHDL 3.5 11/16/2015   She has diet controlled prediabetes.  It has been consistently in the prediabetic range.  Patient does not consciously work on diet.  She is not regularly exercising currently.  Lab Results  Component Value Date   HGBA1C 6.2 (H) 11/16/2015   Last GFR Lab Results  Component Value Date   GFRNONAA 76 11/16/2015     Lab Results  Component Value Date   GFRAA 87 11/16/2015   Patient is on Vitamin D supplement.   Lab Results  Component Value Date   VD25OH 60 11/16/2015      Medication Review: Current Outpatient Prescriptions on File Prior to Visit  Medication Sig Dispense Refill  . aspirin 81 MG chewable tablet Chew by mouth daily.    Marland Kitchen atorvastatin (LIPITOR) 80 MG tablet Take 1 tablet (80 mg total) by mouth daily. 90 tablet 1  . Blood Glucose Monitoring Suppl (ACCU-CHEK AVIVA PLUS) W/DEVICE KIT Check blood sugar 1 time daily-DX-R73.09 1 kit 0  . Cholecalciferol (VITAMIN D PO) Take 5,000 Int'l Units by mouth daily.    . enalapril (VASOTEC) 20 MG tablet TAKE ONE TABLET BY MOUTH ONCE DAILY 90 tablet 1  . estradiol (ESTRACE) 1 MG tablet TAKE ONE TABLET BY MOUTH  ONCE DAILY 90 tablet 1  . glucose blood (ACCU-CHEK AVIVA PLUS) test strip Check blood sugar 1 time daily-DX-R73.09 100 each 12  . hydrochlorothiazide (HYDRODIURIL) 25 MG tablet TAKE ONE TABLET BY MOUTH ONCE DAILY FOR  BLOOD  PRESSURE  AND  FLUID 90 tablet 1  . hydrocortisone (ANUSOL-HC) 25 MG suppository Place 1 suppository (25 mg total) rectally 2 (two) times daily. For 7 days 24 suppository 3  . Lancets (ACCU-CHEK SOFT TOUCH) lancets Check blood sugar 1 time  daily-DX-R73.09 100 each 12  . MAGNESIUM PO Take 800 mg by mouth daily.     . Multiple Vitamins-Minerals (MULTIVITAMIN WITH MINERALS) tablet Take 1 tablet by mouth daily.    . Omega-3 Fatty Acids (FISH OIL PO) Take by mouth daily.     No current facility-administered medications on file prior to visit.     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  Screening Tests Immunization History  Administered Date(s) Administered  . Influenza Split 10/31/2012  . Influenza-Unspecified 09/26/2014, 10/06/2015  . Pneumococcal Conjugate-13 03/09/2014  . Pneumococcal Polysaccharide-23 08/06/2015  . Td 08/09/2012  . Zoster 08/14/2012    Preventative care: Last colonoscopy: 2012 Mammogram:8.17 [id number]  Names of Other Physician/Practitioners you currently use: 1. [facility] here for primary care 2.  Dr. McDermot [facility]  Patient Care Team: Unk Pinto, MD as PCP - General (Internal Medicine) Inda Castle, MD as Consulting Physician (Gastroenterology)  Surgical: She  has a past surgical history that includes Knee surgery (Left, 09/2009) and Abdominal hysterectomy (Bilateral, 1977). Family Her family history includes Heart disease in her brother and mother; Hypertension in her brother; Kidney disease in her brother; Ulcers in her father. Social history  She reports that she quit smoking about 22 years ago. Her smoking use included Cigarettes. She does not have any smokeless tobacco history on file. She reports that she drinks alcohol. Her drug history is not on file.  MEDICARE WELLNESS OBJECTIVES: Physical activity: Current Exercise Habits: Home exercise routine, Type of  exercise: walking, Time (Minutes): 45, Frequency (Times/Week): 6, Weekly Exercise (Minutes/Week): 270, Intensity: Mild Cardiac risk factors: Cardiac Risk Factors include: advanced age (>45mn, >>70women);dyslipidemia;hypertension Depression/mood screen:   [facility]  Decreased Interest 0  Down, Depressed, Hopeless 0  PHQ - 2 Score 0    ADLs:  In your present state of health, do you have any difficulty performing the following activities: 02/16/2016 11/16/2015  Hearing? N N  Vision? N N  Difficulty concentrating or making decisions? N N  Walking or climbing stairs? N N  Dressing or bathing? N N  Doing errands, shopping? N N  Preparing Food and eating ? N -  Using the Toilet? N -  In the past six months, have you accidently leaked urine? N -  Do you have problems with loss of bowel control? N -  Managing your Medications? N -  Managing your Finances? N -  Housekeeping or managing your Housekeeping? N -  Some recent data might be hidden     Cognitive Testing  Alert? Yes  Normal Appearance?Yes  Oriented to person? Yes  Place? Yes   Time? Yes  Recall of three objects?  Yes  Can perform simple calculations? Yes  Displays appropriate judgment?Yes  Can read the  correct time from a watch face?Yes  EOL planning: Does Patient Have a Medical Advance Directive?: Yes   Objective:   Today's Vitals   02/16/16 1035  BP: 130/84  Pulse: 84  Resp: 16  Temp: 97.3 F (36.3 C)  Weight: 175 lb 9.6 oz (79.7 kg)  Height: 5' 4.5" (1.638 m)   Body mass index is 29.68 kg/m.  General appearance: alert, no distress, WD/WN, female HEENT: normocephalic, sclerae anicteric, TMs pearly, nares patent, no discharge or erythema, pharynx normal Oral cavity: MMM, no lesions Neck: supple, no lymphadenopathy, no thyromegaly, no masses Heart: RRR, normal S1, S2, no murmurs Lungs: CTA bilaterally, no wheezes, rhonchi, or rales Abdomen: +bs, soft, non tender, non distended, no  masses, no hepatomegaly, no splenomegaly Musculoskeletal: nontender, no swelling, no obvious deformity Extremities: no edema, no cyanosis, no clubbing Pulses: 2+ symmetric, upper and lower extremities, normal cap refill Neurological: alert, oriented x 3, CN2-12 intact, strength normal upper extremities and lower extremities, sensation normal throughout, DTRs 2+ throughout, no cerebellar signs, gait normal Psychiatric: normal affect, behavior normal, pleasant   Medicare Attestation I have personally reviewed: The patient's medical and social history Their use of alcohol, tobacco or illicit drugs Their current medications and supplements The patient's functional ability including ADLs,fall risks, home safety risks, cognitive, and hearing and visual impairment Diet and physical activities Evidence for depression or mood disorders  The patient's weight, height, BMI, and visual acuity have been recorded in the chart.  I have made referrals, counseling, and provided education to the patient based on review of the above and I have provided the patient with a written personalized care plan for preventive services.     Starlyn Skeans, PA-C   02/16/2016

## 2016-02-18 ENCOUNTER — Other Ambulatory Visit: Payer: Self-pay

## 2016-02-18 DIAGNOSIS — Z1212 Encounter for screening for malignant neoplasm of rectum: Secondary | ICD-10-CM

## 2016-02-18 DIAGNOSIS — Z0001 Encounter for general adult medical examination with abnormal findings: Secondary | ICD-10-CM

## 2016-02-18 LAB — POC HEMOCCULT BLD/STL (HOME/3-CARD/SCREEN)
Card #2 Fecal Occult Blod, POC: NEGATIVE
Card #3 Fecal Occult Blood, POC: NEGATIVE
Fecal Occult Blood, POC: NEGATIVE

## 2016-05-26 ENCOUNTER — Encounter: Payer: Self-pay | Admitting: Internal Medicine

## 2016-05-26 ENCOUNTER — Ambulatory Visit (INDEPENDENT_AMBULATORY_CARE_PROVIDER_SITE_OTHER): Payer: Medicare Other | Admitting: Internal Medicine

## 2016-05-26 VITALS — BP 118/72 | HR 80 | Temp 97.3°F | Resp 16 | Ht 64.5 in | Wt 168.6 lb

## 2016-05-26 DIAGNOSIS — E559 Vitamin D deficiency, unspecified: Secondary | ICD-10-CM

## 2016-05-26 DIAGNOSIS — I10 Essential (primary) hypertension: Secondary | ICD-10-CM | POA: Diagnosis not present

## 2016-05-26 DIAGNOSIS — Z79899 Other long term (current) drug therapy: Secondary | ICD-10-CM

## 2016-05-26 DIAGNOSIS — K219 Gastro-esophageal reflux disease without esophagitis: Secondary | ICD-10-CM

## 2016-05-26 DIAGNOSIS — R7303 Prediabetes: Secondary | ICD-10-CM | POA: Diagnosis not present

## 2016-05-26 DIAGNOSIS — E782 Mixed hyperlipidemia: Secondary | ICD-10-CM | POA: Diagnosis not present

## 2016-05-26 LAB — HEPATIC FUNCTION PANEL
ALK PHOS: 67 U/L (ref 33–130)
ALT: 28 U/L (ref 6–29)
AST: 31 U/L (ref 10–35)
Albumin: 3.9 g/dL (ref 3.6–5.1)
BILIRUBIN DIRECT: 0.1 mg/dL (ref <=0.2)
BILIRUBIN INDIRECT: 0.5 mg/dL (ref 0.2–1.2)
BILIRUBIN TOTAL: 0.6 mg/dL (ref 0.2–1.2)
TOTAL PROTEIN: 6.8 g/dL (ref 6.1–8.1)

## 2016-05-26 LAB — CBC WITH DIFFERENTIAL/PLATELET
BASOS PCT: 1 %
Basophils Absolute: 42 cells/uL (ref 0–200)
EOS PCT: 4 %
Eosinophils Absolute: 168 cells/uL (ref 15–500)
HEMATOCRIT: 41.3 % (ref 35.0–45.0)
Hemoglobin: 13.7 g/dL (ref 11.7–15.5)
LYMPHS PCT: 40 %
Lymphs Abs: 1680 cells/uL (ref 850–3900)
MCH: 28.3 pg (ref 27.0–33.0)
MCHC: 33.2 g/dL (ref 32.0–36.0)
MCV: 85.3 fL (ref 80.0–100.0)
MONO ABS: 378 {cells}/uL (ref 200–950)
MONOS PCT: 9 %
MPV: 10 fL (ref 7.5–12.5)
NEUTROS PCT: 46 %
Neutro Abs: 1932 cells/uL (ref 1500–7800)
PLATELETS: 244 10*3/uL (ref 140–400)
RBC: 4.84 MIL/uL (ref 3.80–5.10)
RDW: 14.5 % (ref 11.0–15.0)
WBC: 4.2 10*3/uL (ref 3.8–10.8)

## 2016-05-26 LAB — LIPID PANEL
CHOLESTEROL: 148 mg/dL (ref <200)
HDL: 41 mg/dL — AB (ref >50)
LDL CALC: 68 mg/dL (ref <100)
TRIGLYCERIDES: 193 mg/dL — AB (ref <150)
Total CHOL/HDL Ratio: 3.6 Ratio (ref <5.0)
VLDL: 39 mg/dL — AB (ref <30)

## 2016-05-26 LAB — BASIC METABOLIC PANEL WITH GFR
BUN: 13 mg/dL (ref 7–25)
CALCIUM: 9.3 mg/dL (ref 8.6–10.4)
CO2: 25 mmol/L (ref 20–31)
Chloride: 104 mmol/L (ref 98–110)
Creat: 0.83 mg/dL (ref 0.60–0.93)
GFR, EST AFRICAN AMERICAN: 82 mL/min (ref >=60)
GFR, EST NON AFRICAN AMERICAN: 71 mL/min (ref >=60)
Glucose, Bld: 121 mg/dL — ABNORMAL HIGH (ref 65–99)
POTASSIUM: 3.5 mmol/L (ref 3.5–5.3)
SODIUM: 140 mmol/L (ref 135–146)

## 2016-05-26 LAB — TSH: TSH: 1.87 mIU/L

## 2016-05-26 NOTE — Patient Instructions (Signed)

## 2016-05-26 NOTE — Progress Notes (Signed)
This very nice  MBF presents for 6 month follow up with Hypertension, Hyperlipidemia, Pre-Diabetes and Vitamin D Deficiency.      Patient is treated for HTN (1995) & BP has been controlled at home. Today's BP is at goal - 118/72. Patient has had no complaints of any cardiac type chest pain, palpitations, dyspnea/orthopnea/PND, dizziness, claudication, or dependent edema.     Hyperlipidemia is controlled with diet & meds. Patient denies myalgias or other med SE's. Last Lipids were at goal: Lab Results  Component Value Date   CHOL 160 11/16/2015   HDL 46 11/16/2015   LDLCALC 85 11/16/2015   TRIG 145 11/16/2015   CHOLHDL 3.5 11/16/2015      Also, the patient has history of Pre_DM (A1c 6.0%) in 2011 and then T2_NIDDM  (A1c 7.4%  In 06/2014). Patient is attempting to control with diet and has had no symptoms of reactive hypoglycemia, diabetic polys, paresthesias or visual blurring.  Last A1c was improved, but still not at goal: Lab Results  Component Value Date   HGBA1C 6.2 (H) 11/16/2015      Further, the patient also has history of Vitamin D Deficiency and supplements vitamin D without any suspected side-effects. Last vitamin D was at goal:  Lab Results  Component Value Date   VD25OH 60 11/16/2015   Current Outpatient Prescriptions on File Prior to Visit  Medication Sig  . aspirin 81 MG chewable tablet Chew by mouth daily.  Marland Kitchen atorvastatin (LIPITOR) 80 MG tablet Take 1 tablet (80 mg total) by mouth daily.  . Blood Glucose Monitoring Suppl (ACCU-CHEK AVIVA PLUS) W/DEVICE KIT Check blood sugar 1 time daily-DX-R73.09  . Cholecalciferol (VITAMIN D PO) Take 5,000 Int'l Units by mouth daily.  . enalapril (VASOTEC) 20 MG tablet TAKE ONE TABLET BY MOUTH ONCE DAILY  . estradiol  1 MG tablet TAKE ONE TAB ONCE DAILY  . hctz 25 MG tablet TAKE ONE TAB ONCE DAILY   . MAGNESIUM 800 mg  Take  daily.   . Multiple Vitamins-Minerals  Take 1 tab daily.  . Omega-3 FISH OIL  Take  daily.  Marland Kitchen topiramate   25 MG tablet Take 1 tab at bedtime.   No Known Allergies   PMHx:   Past Medical History:  Diagnosis Date  . Allergy   . GERD (gastroesophageal reflux disease)   . Hyperlipidemia   . Hypertension   . Prediabetes   . Vitamin D deficiency    Immunization History  Administered Date(s) Administered  . Influenza Split 10/31/2012  . Influenza-Unspecified 09/26/2014, 10/06/2015  . Pneumococcal Conjugate-13 03/09/2014  . Pneumococcal Polysaccharide-23 08/06/2015  . Td 08/09/2012  . Zoster 08/14/2012   Past Surgical History:  Procedure Laterality Date  . ABDOMINAL HYSTERECTOMY Bilateral 1977   bso  . KNEE SURGERY Left 09/2009   FHx:    Reviewed / unchanged  SHx:    Reviewed / unchanged  Systems Review:  Constitutional: Denies fever, chills, wt changes, headaches, insomnia, fatigue, night sweats, change in appetite. Eyes: Denies redness, blurred vision, diplopia, discharge, itchy, watery eyes.  ENT: Denies discharge, congestion, post nasal drip, epistaxis, sore throat, earache, hearing loss, dental pain, tinnitus, vertigo, sinus pain, snoring.  CV: Denies chest pain, palpitations, irregular heartbeat, syncope, dyspnea, diaphoresis, orthopnea, PND, claudication or edema. Respiratory: denies cough, dyspnea, DOE, pleurisy, hoarseness, laryngitis, wheezing.  Gastrointestinal: Denies dysphagia, odynophagia, heartburn, reflux, water brash, abdominal pain or cramps, nausea, vomiting, bloating, diarrhea, constipation, hematemesis, melena, hematochezia  or hemorrhoids. Genitourinary: Denies  dysuria, frequency, urgency, nocturia, hesitancy, discharge, hematuria or flank pain. Musculoskeletal: Denies arthralgias, myalgias, stiffness, jt. swelling, pain, limping or strain/sprain.  Skin: Denies pruritus, rash, hives, warts, acne, eczema or change in skin lesion(s). Neuro: No weakness, tremor, incoordination, spasms, paresthesia or pain. Psychiatric: Denies confusion, memory loss or sensory  loss. Endo: Denies change in weight, skin or hair change.  Heme/Lymph: No excessive bleeding, bruising or enlarged lymph nodes.  Physical Exam  BP 118/72   Pulse 80   Temp 97.3 F (36.3 C)   Resp 16   Ht 5' 4.5" (1.638 m)   Wt 168 lb 9.6 oz (76.5 kg)   BMI 28.49 kg/m   Appears well nourished, well groomed  and in no distress.  Eyes: PERRLA, EOMs, conjunctiva no swelling or erythema. Sinuses: No frontal/maxillary tenderness ENT/Mouth: EAC's clear, TM's nl w/o erythema, bulging. Nares clear w/o erythema, swelling, exudates. Oropharynx clear without erythema or exudates. Oral hygiene is good. Tongue normal, non obstructing. Hearing intact.  Neck: Supple. Thyroid nl. Car 2+/2+ without bruits, nodes or JVD. Chest: Respirations nl with BS clear & equal w/o rales, rhonchi, wheezing or stridor.  Cor: Heart sounds normal w/ regular rate and rhythm without sig. murmurs, gallops, clicks or rubs. Peripheral pulses normal and equal  without edema.  Abdomen: Soft & bowel sounds normal. Non-tender w/o guarding, rebound, hernias, masses or organomegaly.  Lymphatics: Unremarkable.  Musculoskeletal: Full ROM all peripheral extremities, joint stability, 5/5 strength and normal gait.  Skin: Warm, dry without exposed rashes, lesions or ecchymosis apparent.  Neuro: Cranial nerves intact, reflexes equal bilaterally. Sensory-motor testing grossly intact. Tendon reflexes grossly intact.  Pysch: Alert & oriented x 3.  Insight and judgement nl & appropriate. No ideations.  Assessment and Plan:  1. Essential hypertension  - Continue medication, monitor blood pressure at home.  - Continue DASH diet. Reminder to go to the ER if any CP,  SOB, nausea, dizziness, severe HA, changes vision/speech,  left arm numbness and tingling and jaw pain.  - CBC with Differential/Platelet - BASIC METABOLIC PANEL WITH GFR - Magnesium - TSH  2. Hyperlipidemia, mixed  - Continue diet/meds, exercise,& lifestyle  modifications.  - Continue monitor periodic cholesterol/liver & renal functions   - Hepatic function panel - Lipid panel - TSH  3. Prediabetes  - Continue diet, exercise, lifestyle modifications.  - Monitor appropriate labs.  - Hemoglobin A1c - Insulin, random  4. Vitamin D deficiency  - Continue supplementation.  - VITAMIN D 25 Hydroxy   5. Gastroesophageal reflux disease   6. Medication management  - CBC with Differential/Platelet - BASIC METABOLIC PANEL WITH GFR - Hepatic function panel - Magnesium - Lipid panel - TSH - Hemoglobin A1c - Insulin, random - VITAMIN D 25 Hydroxy        Discussed  regular exercise, BP monitoring, weight control to achieve/maintain BMI less than 25 and discussed med and SE's. Recommended labs to assess and monitor clinical status with further disposition pending results of labs. Over 30 minutes of exam, counseling, chart review was performed.

## 2016-05-27 LAB — VITAMIN D 25 HYDROXY (VIT D DEFICIENCY, FRACTURES): Vit D, 25-Hydroxy: 63 ng/mL (ref 30–100)

## 2016-05-27 LAB — MAGNESIUM: Magnesium: 1.8 mg/dL (ref 1.5–2.5)

## 2016-05-27 LAB — HEMOGLOBIN A1C
Hgb A1c MFr Bld: 6.4 % — ABNORMAL HIGH (ref <5.7)
Mean Plasma Glucose: 137 mg/dL

## 2016-05-29 LAB — INSULIN, RANDOM: Insulin: 13 u[IU]/mL (ref 2.0–19.6)

## 2016-05-31 ENCOUNTER — Other Ambulatory Visit: Payer: Self-pay | Admitting: Physician Assistant

## 2016-06-21 ENCOUNTER — Other Ambulatory Visit: Payer: Self-pay | Admitting: Internal Medicine

## 2016-07-06 ENCOUNTER — Other Ambulatory Visit: Payer: Self-pay | Admitting: Internal Medicine

## 2016-07-17 LAB — HM DIABETES EYE EXAM

## 2016-08-10 ENCOUNTER — Other Ambulatory Visit: Payer: Self-pay | Admitting: Internal Medicine

## 2016-08-11 ENCOUNTER — Other Ambulatory Visit: Payer: Self-pay | Admitting: Internal Medicine

## 2016-08-11 DIAGNOSIS — Z1231 Encounter for screening mammogram for malignant neoplasm of breast: Secondary | ICD-10-CM

## 2016-09-12 NOTE — Progress Notes (Signed)
Assessment and Plan:  Hypertension -Continue medication, monitor blood pressure at home. Continue DASH diet.  Reminder to go to the ER if any CP, SOB, nausea, dizziness, severe HA, changes vision/speech, left arm numbness and tingling and jaw pain.  Cholesterol -Continue diet and exercise. Check cholesterol.    Prediabetes  -Continue diet and exercise. Check A1C   Vitamin D Def - check level and continue medications.  Continue diet and meds as discussed. Further disposition pending results of labs. Over 30 minutes of exam, counseling, chart review, and critical decision making was performed  HPI 71 y.o. female  presents for 3 month follow up on hypertension, cholesterol, prediabetes, and vitamin D deficiency.   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 She denies chest pain, shortness of breath, dizziness. She can only take 500 mg of magnesium due to diarrhea/hemorrhiods.   She is on cholesterol medication and denies myalgias. Her cholesterol is at goal. The cholesterol last visit was:   Lab Results  Component Value Date   CHOL 148 05/26/2016   HDL 41 (L) 05/26/2016   LDLCALC 68 05/26/2016   TRIG 193 (H) 05/26/2016   CHOLHDL 3.6 05/26/2016    She has been working on diet and exercise for prediabetes x 2011, last visit her A1C was in the DM range for the first time, and denies paresthesia of the feet, polydipsia, polyuria and visual disturbances. Last A1C in the office was:  Lab Results  Component Value Date   HGBA1C 6.4 (H) 05/26/2016   Patient is on Vitamin D supplement.   Lab Results  Component Value Date   VD25OH 63 05/26/2016     BMI is Body mass index is 27.95 kg/m., she is working on diet and exercise. Wt Readings from Last 3 Encounters:  09/13/16 165 lb 6.4 oz (75 kg)  05/26/16 168 lb 9.6 oz (76.5 kg)  02/16/16 175 lb 9.6 oz (79.7 kg)    Current  Medications:  Current Outpatient Prescriptions on File Prior to Visit  Medication Sig Dispense Refill  . ACCU-CHEK AVIVA PLUS test strip USE STRIP TO CHECK BLOOD SUGAR ONCE DAILY 100 each 5  . aspirin 81 MG chewable tablet Chew by mouth daily.    Marland Kitchen atorvastatin (LIPITOR) 80 MG tablet Take 1 tablet (80 mg total) by mouth daily. 90 tablet 1  . Blood Glucose Monitoring Suppl (ACCU-CHEK AVIVA PLUS) W/DEVICE KIT Check blood sugar 1 time daily-DX-R73.09 1 kit 0  . Cholecalciferol (VITAMIN D PO) Take 5,000 Int'l Units by mouth daily.    . enalapril (VASOTEC) 20 MG tablet TAKE ONE TABLET BY MOUTH ONCE DAILY 90 tablet 1  . estradiol (ESTRACE) 1 MG tablet TAKE ONE TABLET BY MOUTH ONCE DAILY 90 tablet 1  . hydrochlorothiazide (HYDRODIURIL) 25 MG tablet TAKE ONE TABLET BY MOUTH ONCE DAILY FOR BLOOD PRESSURE AND  FLUID 90 tablet 1  . hydrocortisone (ANUSOL-HC) 25 MG suppository Place 1 suppository (25 mg total) rectally 2 (two) times daily. For 7 days 24 suppository 3  . Lancets (ACCU-CHEK SOFT TOUCH) lancets Check blood sugar 1 time daily-DX-R73.09 100 each 12  . MAGNESIUM PO Take 800 mg by mouth daily.     . Multiple Vitamins-Minerals (MULTIVITAMIN WITH MINERALS) tablet Take 1 tablet by mouth daily.    . Omega-3 Fatty Acids (FISH OIL PO) Take by mouth daily.    . solifenacin (VESICARE) 5 MG  tablet Take 5 mg by mouth daily.    Marland Kitchen topiramate (TOPAMAX) 25 MG tablet Take 1 tablet (25 mg total) by mouth at bedtime. 90 tablet 3   No current facility-administered medications on file prior to visit.    Medical History:  Past Medical History:  Diagnosis Date  . Allergy   . GERD (gastroesophageal reflux disease)   . Hyperlipidemia   . Hypertension   . Prediabetes   . Vitamin D deficiency    Allergies: Not on File   Review of Systems:  Review of Systems  Constitutional: Negative.   HENT: Negative.   Eyes: Negative.   Respiratory: Negative.   Cardiovascular: Negative.   Gastrointestinal: Negative.    Genitourinary: Negative.   Musculoskeletal: Negative for back pain, falls, joint pain, myalgias and neck pain.  Skin: Negative.   Neurological: Negative.  Negative for dizziness, tingling, tremors, sensory change, speech change, focal weakness, seizures and loss of consciousness.  Endo/Heme/Allergies: Negative.   Psychiatric/Behavioral: Negative.     Family history- Review and unchanged Social history- Review and unchanged Physical Exam: BP 130/80   Pulse 78   Temp 97.9 F (36.6 C)   Resp 14   Ht 5' 4.5" (1.638 m)   Wt 165 lb 6.4 oz (75 kg)   SpO2 96%   BMI 27.95 kg/m  Wt Readings from Last 3 Encounters:  09/13/16 165 lb 6.4 oz (75 kg)  05/26/16 168 lb 9.6 oz (76.5 kg)  02/16/16 175 lb 9.6 oz (79.7 kg)   General Appearance: Well nourished, in no apparent distress. Eyes: PERRLA, EOMs, conjunctiva no swelling or erythema Sinuses: No Frontal/maxillary tenderness ENT/Mouth: Ext aud canals clear, TMs without erythema, bulging. No erythema, swelling, or exudate on post pharynx.  Tonsils not swollen or erythematous. Hearing normal.  Neck: Supple, thyroid normal.  Respiratory: Respiratory effort normal, BS equal bilaterally without rales, rhonchi, wheezing or stridor.  Cardio: RRR with systolic 1/6 murmur radiation to carotids, Brisk peripheral pulses without edema.  Abdomen: Soft, + BS,  Non tender, no guarding, rebound, hernias, masses. Lymphatics: Non tender without lymphadenopathy.  Musculoskeletal: Full ROM, 5/5 strength, Normal gait. Skin: Warm, dry without rashes, lesions, ecchymosis.  Neuro: Cranial nerves intact. Normal muscle tone, no cerebellar symptoms. Psych: Awake and oriented X 3, normal affect, Insight and Judgment appropriate.    Vicie Mutters, PA-C 9:46 AM [facility]

## 2016-09-13 ENCOUNTER — Ambulatory Visit (INDEPENDENT_AMBULATORY_CARE_PROVIDER_SITE_OTHER): Payer: Medicare Other | Admitting: Physician Assistant

## 2016-09-13 ENCOUNTER — Encounter: Payer: Self-pay | Admitting: Physician Assistant

## 2016-09-13 VITALS — BP 130/80 | HR 78 | Temp 97.9°F | Resp 14 | Ht 64.5 in | Wt 165.4 lb

## 2016-09-13 DIAGNOSIS — E782 Mixed hyperlipidemia: Secondary | ICD-10-CM | POA: Diagnosis not present

## 2016-09-13 DIAGNOSIS — Z79899 Other long term (current) drug therapy: Secondary | ICD-10-CM | POA: Diagnosis not present

## 2016-09-13 DIAGNOSIS — R7303 Prediabetes: Secondary | ICD-10-CM

## 2016-09-13 DIAGNOSIS — E559 Vitamin D deficiency, unspecified: Secondary | ICD-10-CM

## 2016-09-13 DIAGNOSIS — Z23 Encounter for immunization: Secondary | ICD-10-CM | POA: Diagnosis not present

## 2016-09-13 DIAGNOSIS — I10 Essential (primary) hypertension: Secondary | ICD-10-CM | POA: Diagnosis not present

## 2016-09-13 NOTE — Addendum Note (Signed)
Addended by: Vicie Mutters R on: 09/13/2016 09:59 AM   Modules accepted: Orders

## 2016-09-13 NOTE — Patient Instructions (Addendum)
Drink 80-100 oz a day of water, measure it out Eat 3 meals a day, have to do breakfast, eat protein- hard boiled eggs, protein bar like [place] protein bar, greek yogurt like oikos triple zero, chobani 100, or light n fit greek  We want weight loss that will last so you should lose 1-2 pounds a week.  THAT IS IT! Please pick THREE things a month to change. Once it is a habit check off the item. Then pick another three items off the list to become habits.  If you are already doing a habit on the list GREAT!  Cross that item off! o Don't drink your calories. Ie, alcohol, soda, fruit juice, and sweet tea.  o Drink more water. Drink a glass when you feel hungry or before each meal.  o Eat breakfast - Complex carb and protein (likeDannon light and fit yogurt, oatmeal, fruit, eggs, [place] bacon). o Measure your cereal.  Eat no more than one cup a day. (ie [place]) o Eat an apple a day. o Add a vegetable a day. o Try a new vegetable a month. o Use Pam! Stop using oil or butter to cook. o Don't finish your plate or use smaller plates. o Share your dessert. o Eat sugar free Jello for dessert or frozen grapes. o Don't eat 2-3 hours before bed. o Switch to whole wheat bread, pasta, and brown rice. o Make healthier choices when you eat out. No fries! o Pick baked chicken, NOT fried. o Don't forget to SLOW DOWN when you eat. It is not going anywhere.  o Take the stairs. o Park far away in the parking lot o [employer] (or weights) for 10 minutes while watching TV. o Walk at work for 10 minutes during break. o Walk outside 1 time a week with your friend, kids, dog, or significant other. o Start a walking group at [place] the mall as much as you can tolerate.  o Keep a food diary. o Weigh yourself daily. o Walk for 15 minutes 3 days per week. o Cook at home more often and eat out less.  If life happens and you go back to old habits, it is okay.  Just start over. You can do it!   If  you experience chest pain, get short of breath, or tired during the exercise, please stop immediately and inform your doctor.   Potassium Content of Foods  The body needs potassium to control blood pressure and to keep the muscles and nervous system healthy. Here are some healthy foods below that are high in potassium. Also you can get the white label salt of "NO SALT" salt substitute, 1/4 teaspoon of this is equivalent to 42meq potassium.   FOODS AND DRINKS HIGH IN POTASSIUM FOODS MODERATE IN POTASSIUM   Fruits  Avocado (cubed),  c / 50 g.  Cantaloupe (cubed), 80 g.  Honeydew, 1 wedge / 85 g.  Kiwi (sliced), 90 g.  Nectarine, 1 small / 129 g.  Orange, 1 medium / 131 g. Vegetables  Artichoke,  of a medium / 64 g.  Asparagus (boiled), 90 g..  Broccoli (boiled), 78 g.  Brussels sprout (boiled), 78 g.  Butternut squash (baked), 103 g.  Chickpea (cooked), 82 g.  Green peas (cooked), 80 g.  Kidney beans (cooked), 5 tbsp / 55 g.  Lima beans (cooked),  c / 43 g.  Navy beans (cooked),  c / 61 g.  Spinach (cooked),  c / 45  g.  Sweet potato (baked),  c / 50 g.  Tomato (chopped or sliced), 90 g.  Vegetable juice.  White mushrooms (cooked), 78 g.  Yam (cooked or baked),  c / 34 g.  Zucchini squash (boiled), 90 g. Other Foods and Drinks  Almonds (whole),  c / 36 g.  Fish, 3 oz / 85 g.  Nonfat fruit variety yogurt, 123 g.  Pistachio nuts, 1 oz / 28 g.  Pumpkin seeds, 1 oz / 28 g.  Red meat (broiled, cooked, grilled), 3 oz / 85 g.  Scallops (steamed), 3 oz / 85 g.  Spaghetti sauce,  c / 66 g.  Sunflower seeds (dry roasted), 1 oz / 28 g.  Veggie burger, 1 patty / 70 g. Fruits  Grapefruit,  of the fruit / 332 g  Plums (sliced), 83 g.  Tangerine, 1 large / 120 g. Vegetables  Carrots (boiled), 78 g.  Carrots (sliced), 61 g.  Rhubarb (cooked with sugar), 120 g.  Rutabaga (cooked), 120 g.  Yellow snap beans (cooked), 63 g. Other Foods  and Drinks   Chicken breast (roasted and chopped),  c / 70 g.  Pita bread, 1 large / 64 g.  Shrimp (steamed), 4 oz / 113 g.  Swiss cheese (diced), 70 g.

## 2016-09-14 ENCOUNTER — Ambulatory Visit
Admission: RE | Admit: 2016-09-14 | Discharge: 2016-09-14 | Disposition: A | Discharge status: Home / Self Care | Payer: Medicare Other | Source: Ambulatory Visit | Attending: Internal Medicine | Admitting: Internal Medicine

## 2016-09-14 DIAGNOSIS — Z1231 Encounter for screening mammogram for malignant neoplasm of breast: Secondary | ICD-10-CM

## 2016-09-14 LAB — HEPATIC FUNCTION PANEL
AG RATIO: 1.2 (calc) (ref 1.0–2.5)
ALT: 24 U/L (ref 6–29)
AST: 24 U/L (ref 10–35)
Albumin: 3.7 g/dL (ref 3.6–5.1)
Alkaline phosphatase (APISO): 75 U/L (ref 33–130)
BILIRUBIN INDIRECT: 0.4 mg/dL (ref 0.2–1.2)
Bilirubin, Direct: 0.1 mg/dL (ref 0.0–0.2)
GLOBULIN: 3 g/dL (ref 1.9–3.7)
Total Bilirubin: 0.5 mg/dL (ref 0.2–1.2)
Total Protein: 6.7 g/dL (ref 6.1–8.1)

## 2016-09-14 LAB — BASIC METABOLIC PANEL WITH GFR
BUN: 11 mg/dL (ref 7–25)
CALCIUM: 9.1 mg/dL (ref 8.6–10.4)
CHLORIDE: 105 mmol/L (ref 98–110)
CO2: 28 mmol/L (ref 20–32)
Creat: 0.9 mg/dL (ref 0.60–0.93)
GFR, EST AFRICAN AMERICAN: 74 mL/min/{1.73_m2} (ref >=60)
GFR, EST NON AFRICAN AMERICAN: 64 mL/min/{1.73_m2} (ref >=60)
Glucose, Bld: 124 mg/dL — ABNORMAL HIGH (ref 65–99)
Potassium: 3.7 mmol/L (ref 3.5–5.3)
SODIUM: 141 mmol/L (ref 135–146)

## 2016-09-14 LAB — CBC WITH DIFFERENTIAL/PLATELET
BASOS ABS: 41 {cells}/uL (ref 0–200)
Basophils Relative: 0.9 %
EOS ABS: 140 {cells}/uL (ref 15–500)
Eosinophils Relative: 3.1 %
HCT: 39.9 % (ref 35.0–45.0)
Hemoglobin: 13.4 g/dL (ref 11.7–15.5)
Lymphs Abs: 1346 cells/uL (ref 850–3900)
MCH: 28.4 pg (ref 27.0–33.0)
MCHC: 33.6 g/dL (ref 32.0–36.0)
MCV: 84.5 fL (ref 80.0–100.0)
MONOS PCT: 10.9 %
MPV: 10.9 fL (ref 7.5–12.5)
NEUTROS PCT: 55.2 %
Neutro Abs: 2484 cells/uL (ref 1500–7800)
PLATELETS: 252 10*3/uL (ref 140–400)
RBC: 4.72 10*6/uL (ref 3.80–5.10)
RDW: 13.8 % (ref 11.0–15.0)
TOTAL LYMPHOCYTE: 29.9 %
WBC mixed population: 491 cells/uL (ref 200–950)
WBC: 4.5 10*3/uL (ref 3.8–10.8)

## 2016-09-14 LAB — LIPID PANEL
CHOL/HDL RATIO: 3.4 (calc) (ref <5.0)
CHOLESTEROL: 141 mg/dL (ref <200)
HDL: 41 mg/dL — ABNORMAL LOW (ref >50)
LDL Cholesterol (Calc): 72 mg/dL (calc)
NON-HDL CHOLESTEROL (CALC): 100 mg/dL (ref <130)
Triglycerides: 181 mg/dL — ABNORMAL HIGH (ref <150)

## 2016-09-14 LAB — MAGNESIUM: Magnesium: 1.8 mg/dL (ref 1.5–2.5)

## 2016-09-14 LAB — TSH: TSH: 1.68 mIU/L (ref 0.40–4.50)

## 2016-09-14 LAB — HEMOGLOBIN A1C
HEMOGLOBIN A1C: 6.6 %{Hb} — AB (ref <5.7)
MEAN PLASMA GLUCOSE: 143 (calc)
eAG (mmol/L): 7.9 (calc)

## 2016-10-03 ENCOUNTER — Other Ambulatory Visit: Payer: Self-pay | Admitting: Physician Assistant

## 2016-10-03 MED ORDER — ATORVASTATIN CALCIUM 80 MG PO TABS: 80.0000 mg | ORAL_TABLET | Freq: Every day | ORAL | 1 refills | Status: DC

## 2016-11-13 ENCOUNTER — Encounter: Payer: Self-pay | Admitting: Adult Health

## 2016-11-23 ENCOUNTER — Encounter: Payer: Self-pay | Admitting: Internal Medicine

## 2016-12-04 ENCOUNTER — Other Ambulatory Visit: Payer: Self-pay | Admitting: Internal Medicine

## 2016-12-13 ENCOUNTER — Encounter: Payer: Self-pay | Admitting: Internal Medicine

## 2016-12-15 ENCOUNTER — Other Ambulatory Visit: Payer: Self-pay | Admitting: Internal Medicine

## 2016-12-29 ENCOUNTER — Encounter: Payer: Self-pay | Admitting: Internal Medicine

## 2017-01-08 ENCOUNTER — Other Ambulatory Visit: Payer: Self-pay | Admitting: Internal Medicine

## 2017-01-17 ENCOUNTER — Encounter: Payer: Self-pay | Admitting: Internal Medicine

## 2017-01-22 ENCOUNTER — Other Ambulatory Visit: Payer: Self-pay | Admitting: Internal Medicine

## 2017-01-22 MED ORDER — PROMETHAZINE-DM 6.25-15 MG/5ML PO SYRP: ORAL_SOLUTION | ORAL | 0 refills | Status: DC

## 2017-01-22 MED ORDER — AZITHROMYCIN 250 MG PO TABS: ORAL_TABLET | ORAL | 0 refills | Status: DC

## 2017-01-23 ENCOUNTER — Encounter: Payer: Self-pay | Admitting: Internal Medicine

## 2017-02-14 ENCOUNTER — Encounter: Payer: Self-pay | Admitting: Internal Medicine

## 2017-02-14 ENCOUNTER — Ambulatory Visit (INDEPENDENT_AMBULATORY_CARE_PROVIDER_SITE_OTHER): Payer: Medicare Other | Admitting: Internal Medicine

## 2017-02-14 VITALS — BP 130/80 | HR 80 | Temp 97.3°F | Resp 16 | Ht 65.0 in | Wt 160.0 lb

## 2017-02-14 DIAGNOSIS — R7309 Other abnormal glucose: Secondary | ICD-10-CM

## 2017-02-14 DIAGNOSIS — K219 Gastro-esophageal reflux disease without esophagitis: Secondary | ICD-10-CM

## 2017-02-14 DIAGNOSIS — J0140 Acute pansinusitis, unspecified: Secondary | ICD-10-CM

## 2017-02-14 DIAGNOSIS — Z79899 Other long term (current) drug therapy: Secondary | ICD-10-CM

## 2017-02-14 DIAGNOSIS — Z1212 Encounter for screening for malignant neoplasm of rectum: Secondary | ICD-10-CM

## 2017-02-14 DIAGNOSIS — Z0000 Encounter for general adult medical examination without abnormal findings: Secondary | ICD-10-CM | POA: Diagnosis not present

## 2017-02-14 DIAGNOSIS — Z87891 Personal history of nicotine dependence: Secondary | ICD-10-CM

## 2017-02-14 DIAGNOSIS — Z136 Encounter for screening for cardiovascular disorders: Secondary | ICD-10-CM | POA: Diagnosis not present

## 2017-02-14 DIAGNOSIS — E559 Vitamin D deficiency, unspecified: Secondary | ICD-10-CM

## 2017-02-14 DIAGNOSIS — Z1211 Encounter for screening for malignant neoplasm of colon: Secondary | ICD-10-CM

## 2017-02-14 DIAGNOSIS — B001 Herpesviral vesicular dermatitis: Secondary | ICD-10-CM

## 2017-02-14 DIAGNOSIS — I10 Essential (primary) hypertension: Secondary | ICD-10-CM

## 2017-02-14 DIAGNOSIS — E782 Mixed hyperlipidemia: Secondary | ICD-10-CM

## 2017-02-14 DIAGNOSIS — R7303 Prediabetes: Secondary | ICD-10-CM

## 2017-02-14 DIAGNOSIS — Z0001 Encounter for general adult medical examination with abnormal findings: Secondary | ICD-10-CM

## 2017-02-14 MED ORDER — ACYCLOVIR 800 MG PO TABS: ORAL_TABLET | ORAL | 3 refills | Status: DC

## 2017-02-14 MED ORDER — LEVOFLOXACIN 500 MG PO TABS: ORAL_TABLET | ORAL | 1 refills | Status: DC

## 2017-02-14 MED ORDER — PREDNISONE 20 MG PO TABS: ORAL_TABLET | ORAL | 0 refills | Status: DC

## 2017-02-14 NOTE — Progress Notes (Signed)
[facility] Kaitlyn Townsend, M.D.     Kaitlyn Townsend. Kaitlyn Townsend, P.A.-C Kaitlyn Townsend, Kaitlyn Townsend [address] [place] [address] Telephone [phone] Telefax [phone] Annual Screening/Preventative Visit & Comprehensive Evaluation &  Examination     This very nice 72 y.o. MBF presents for a Screening/Preventative Visit & comprehensive evaluation and management of multiple medical co-morbidities.  Patient has been followed for HTN, T2_NIDDM  Prediabetes, Hyperlipidemia and Vitamin D Deficiency. 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       HTN predates since 1995. Patient's BP has been controlled at home and patient denies any cardiac symptoms as chest pain, palpitations, shortness of breath, dizziness or ankle swelling. Today's BP is at goal - 130/80.      Patient's hyperlipidemia is controlled with diet and medications. Patient denies myalgias or other medication SE's. Last lipids were at goal albeit sl elevated Trig's: Lab Results  Component Value Date   CHOL 141 09/13/2016   HDL 41 (L) 09/13/2016   LDLCALC 68 05/26/2016   TRIG 181 (H) 09/13/2016   CHOLHDL 3.4 09/13/2016      Patient has prediabetes (A1c 6.0%/2011)  and then T2_NIDDM 9 (A1c 7.4% / 2016) and patient is attempting control with diet.  Patient denies reactive hypoglycemic symptoms, visual blurring, diabetic polys, or paresthesias. Last A1c was not at goal: Lab Results  Component Value Date   HGBA1C 6.6 (H) 09/13/2016      Finally, patient has history of Vitamin D Deficiency and last Vitamin D was  Lab Results  Component Value Date   VD25OH 63 05/26/2016   Current Outpatient Medications on File Prior to Visit  Medication Sig  . ACCU-CHEK AVIVA PLUS test strip USE STRIP TO CHECK BLOOD SUGAR ONCE DAILY  . aspirin 81  MG chewable tablet Chew by mouth daily.  Marland Kitchen atorvastatin (LIPITOR) 80 MG tablet Take 1 tablet (80 mg total) by mouth daily.  . Blood Glucose Monitoring Suppl (ACCU-CHEK AVIVA PLUS) W/DEVICE KIT Check blood sugar 1 time daily-DX-R73.09  . Cholecalciferol (VITAMIN D PO) Take 5,000 Int'l Units by mouth daily.  . enalapril (VASOTEC) 20 MG tablet TAKE 1 TABLET BY MOUTH ONCE DAILY  . estradiol (ESTRACE) 1 MG tablet TAKE 1 TABLET BY MOUTH ONCE DAILY  . hydrochlorothiazide (HYDRODIURIL) 25 MG tablet TAKE 1 TABLET BY MOUTH ONCE DAILY FOR BLOOD PRESSURE AND  FLUID  . hydrocortisone (ANUSOL-HC) 25 MG suppository Place 1 suppository (25 mg total) rectally 2 (two) times daily. For 7 days  . Lancets (ACCU-CHEK SOFT TOUCH) lancets Check blood sugar 1 time daily-DX-R73.09  . MAGNESIUM PO Take 500 mg by mouth daily.   . Multiple Vitamins-Minerals (MULTIVITAMIN WITH MINERALS) tablet Take 1 tablet by mouth daily.  . Omega-3 Fatty Acids (FISH OIL PO) Take by mouth daily.  . solifenacin (VESICARE) 5 MG tablet Take 5 mg by mouth daily.  Marland Kitchen topiramate (TOPAMAX) 25 MG tablet Take 1 tablet (25 mg total) by mouth at bedtime.   No current facility-administered medications on file prior to visit.    Past Medical History:  Diagnosis Date  . Allergy   . GERD (gastroesophageal reflux disease)   . Hyperlipidemia   . Hypertension   . Prediabetes   . Vitamin D deficiency    Health Maintenance  Topic Date Due  . FOOT EXAM  11/15/2016  . HEMOGLOBIN A1C  03/15/2017  . OPHTHALMOLOGY EXAM  07/17/2017  . MAMMOGRAM  09/15/2018  . COLONOSCOPY  02/02/2020  . TETANUS/TDAP  08/10/2022  . INFLUENZA VACCINE  Completed  . DEXA SCAN  Completed  . Hepatitis C Screening  Completed  . PNA vac Low Risk Adult  Completed   Immunization History  Administered Date(s) Administered  . Influenza Split 10/31/2012  . Influenza, High Dose Seasonal PF 09/13/2016  . Influenza-Unspecified 09/26/2014, 10/06/2015  . Pneumococcal Conjugate-13  03/09/2014  . Pneumococcal Polysaccharide-23 08/06/2015  . Td 08/09/2012  . Zoster 08/14/2012   Last Colon - 02/01/2010 - Dr Gwenlyn Fudge - recc 10 yr f/u due 10/2020 Last MGM - 09/14/2016  Past Surgical History:  Procedure Laterality Date  . ABDOMINAL HYSTERECTOMY Bilateral 1977   bso  . KNEE SURGERY Left 09/2009   Family History  Problem Relation Age of Onset  . Heart disease Mother   . Ulcers Father   . Heart disease Brother   . Hypertension Brother   . Kidney disease Brother    Social History   Tobacco Use  . Smoking status: Former Smoker    Types: Cigarettes    Last attempt to quit: 01/16/1994    Years since quitting: 23.0  . Smokeless tobacco: Never Used  Substance Use Topics  . Alcohol use: Yes    Comment: ocassional  . Drug use: No    ROS Constitutional: Denies fever, chills, weight loss/gain, headaches, insomnia,  night sweats, and change in appetite. Does c/o fatigue. Eyes: Denies redness, blurred vision, diplopia, discharge, itchy, watery eyes.  ENT: Denies discharge, congestion, post nasal drip, epistaxis, sore throat, earache, hearing loss, dental pain, Tinnitus, Vertigo, Sinus pain, snoring.  Cardio: Denies chest pain, palpitations, irregular heartbeat, syncope, dyspnea, diaphoresis, orthopnea, PND, claudication, edema Respiratory: denies cough, dyspnea, DOE, pleurisy, hoarseness, laryngitis, wheezing.  Gastrointestinal: Denies dysphagia, heartburn, reflux, water brash, pain, cramps, nausea, vomiting, bloating, diarrhea, constipation, hematemesis, melena, hematochezia, jaundice, hemorrhoids Genitourinary: Denies dysuria, frequency, urgency, nocturia, hesitancy, discharge, hematuria, flank pain Breast: Breast lumps, nipple discharge, bleeding.  Musculoskeletal: Denies arthralgia, myalgia, stiffness, Jt. Swelling, pain, limp, and strain/sprain. Denies falls. Skin: Denies puritis, rash, hives, warts, acne, eczema, changing in skin lesion Neuro: No weakness, tremor,  incoordination, spasms, paresthesia, pain Psychiatric: Denies confusion, memory loss, sensory loss. Denies Depression. Endocrine: Denies change in weight, skin, hair change, nocturia, and paresthesia, diabetic polys, visual blurring, hyper / hypo glycemic episodes.  Heme/Lymph: No excessive bleeding, bruising, enlarged lymph nodes.  Physical Exam  BP 130/80   Pulse 80   Temp (!) 97.3 F (36.3 C)   Resp 16   Ht 5' 5" (1.651 m)   Wt 160 lb (72.6 kg)   BMI 26.63 kg/m   General Appearance: Well nourished, well groomed and in no apparent distress.  Eyes: PERRLA, EOMs, conjunctiva no swelling or erythema, normal fundi and vessels. Sinuses: (+) frontal/maxillary tenderness. ENT/Mouth: EACs patent / TMs  nl. Nares clear without erythema, swelling, mucoid exudates. Oral hygiene is good. No erythema, swelling, or exudate. Tongue normal, non-obstructing. Tonsils not swollen or erythematous. Hearing normal. Noted "fever" blister of the lateral Rt lower lip. Neck: Supple, thyroid normal. No bruits, nodes or JVD. Respiratory: Respiratory effort normal.  BS equal and clear bilateral without rales, rhonci, wheezing or stridor. Cardio: Heart sounds are normal with regular rate and rhythm and no murmurs, rubs or gallops. Peripheral pulses are normal and equal bilaterally without edema. No aortic or femoral bruits. Chest: symmetric with normal excursions and percussion. Breasts: Symmetric, without  lumps, nipple discharge, retractions, or fibrocystic changes.  Abdomen: Flat, soft with bowel sounds active. Nontender, no guarding, rebound, hernias, masses, or organomegaly.  Lymphatics: Non tender without lymphadenopathy.  Genitourinary:  Musculoskeletal: Full ROM all peripheral extremities, joint stability, 5/5 strength, and normal gait. Skin: Warm and dry without rashes, lesions, cyanosis, clubbing or  ecchymosis.  Neuro: Cranial nerves intact, reflexes equal bilaterally. Normal muscle tone, no cerebellar  symptoms. Sensation intact.  Pysch: Alert and oriented X 3, normal affect, Insight and Judgment appropriate.   Assessment and Plan  1. Annual Preventative Screening Examination  2. Essential hypertension  - EKG 12-Lead - Microalbumin / creatinine urine ratio - CBC with Differential/Platelet - BASIC METABOLIC PANEL WITH GFR - Magnesium - TSH  3. Hyperlipidemia, mixed  - EKG 12-Lead - Hepatic function panel - Lipid panel - TSH  4. Prediabetes  - EKG 12-Lead - Urinalysis, Routine w reflex microscopic - Microalbumin / creatinine urine ratio - HM DIABETES FOOT EXAM - LOW EXTREMITY NEUR EXAM DOCUM - Hemoglobin A1c - Insulin, random  5. Vitamin D deficiency  - VITAMIN D 25 Hydroxy  6. Abnormal glucose  - Hemoglobin A1c - Insulin, random  7. Gastroesophageal reflux disease  - CBC with Differential/Platelet  8. Screening for colorectal cancer  - POC Hemoccult Bld/Stl   9. Screening for ischemic heart disease  - EKG 12-Lead  10. Former smoker  - EKG 12-Lead  11. Fever blister  - acyclovir (ZOVIRAX) 800 MG tablet; Take 1 tablet daily for Fever Blisters  Dispense: 90 tablet; Refill: 3  12. Subacute pansinusitis  - predniSONE (DELTASONE) 20 MG tablet; 1 tab 3 x day for 3 days, then 1 tab 2 x day for 3 days, then 1 tab 1 x day for 5 days  Dispense: 20 tablet; Refill: 0 - levofloxacin (LEVAQUIN) 500 MG tablet; Take 1 tablet daily with food for infection  Dispense: 15 tablet; Refill: 1  13. Medication management  - Urinalysis, Routine w reflex microscopic - Microalbumin / creatinine urine ratio - CBC with Differential/Platelet - BASIC METABOLIC PANEL WITH GFR - Hepatic function panel - Magnesium - Lipid panel - TSH - Hemoglobin A1c - Insulin, random - VITAMIN D 25 Hydroxy        Patient was counseled in prudent diet to achieve/maintain BMI less than 25 for weight control, BP monitoring, regular exercise and medications. Discussed med's effects and  SE's. Screening labs and tests as requested with regular follow-up as recommended. Over 40 minutes of exam, counseling, chart review and high complex critical decision making was performed.

## 2017-02-14 NOTE — Patient Instructions (Signed)

## 2017-02-15 LAB — BASIC METABOLIC PANEL WITH GFR
BUN: 10 mg/dL (ref 7–25)
CO2: 31 mmol/L (ref 20–32)
CREATININE: 0.85 mg/dL (ref 0.60–0.93)
Calcium: 9.9 mg/dL (ref 8.6–10.4)
Chloride: 103 mmol/L (ref 98–110)
GFR, EST AFRICAN AMERICAN: 79 mL/min/{1.73_m2} (ref >=60)
GFR, EST NON AFRICAN AMERICAN: 68 mL/min/{1.73_m2} (ref >=60)
Glucose, Bld: 121 mg/dL — ABNORMAL HIGH (ref 65–99)
Potassium: 3.9 mmol/L (ref 3.5–5.3)
SODIUM: 144 mmol/L (ref 135–146)

## 2017-02-15 LAB — CBC WITH DIFFERENTIAL/PLATELET
BASOS ABS: 51 {cells}/uL (ref 0–200)
BASOS PCT: 1 %
EOS ABS: 122 {cells}/uL (ref 15–500)
Eosinophils Relative: 2.4 %
HEMATOCRIT: 43.2 % (ref 35.0–45.0)
HEMOGLOBIN: 13.6 g/dL (ref 11.7–15.5)
LYMPHS ABS: 1428 {cells}/uL (ref 850–3900)
MCH: 25.7 pg — AB (ref 27.0–33.0)
MCHC: 31.5 g/dL — AB (ref 32.0–36.0)
MCV: 81.5 fL (ref 80.0–100.0)
MPV: 10.6 fL (ref 7.5–12.5)
Monocytes Relative: 9.3 %
NEUTROS ABS: 3024 {cells}/uL (ref 1500–7800)
Neutrophils Relative %: 59.3 %
Platelets: 293 10*3/uL (ref 140–400)
RBC: 5.3 10*6/uL — ABNORMAL HIGH (ref 3.80–5.10)
RDW: 14.2 % (ref 11.0–15.0)
Total Lymphocyte: 28 %
WBC: 5.1 10*3/uL (ref 3.8–10.8)
WBCMIX: 474 {cells}/uL (ref 200–950)

## 2017-02-15 LAB — MICROALBUMIN / CREATININE URINE RATIO
Creatinine, Urine: 129 mg/dL (ref 20–275)
MICROALB UR: 1 mg/dL
Microalb Creat Ratio: 8 mcg/mg creat (ref <30)

## 2017-02-15 LAB — HEPATIC FUNCTION PANEL
AG RATIO: 1.3 (calc) (ref 1.0–2.5)
ALKALINE PHOSPHATASE (APISO): 90 U/L (ref 33–130)
ALT: 28 U/L (ref 6–29)
AST: 27 U/L (ref 10–35)
Albumin: 4 g/dL (ref 3.6–5.1)
BILIRUBIN TOTAL: 0.7 mg/dL (ref 0.2–1.2)
Bilirubin, Direct: 0.2 mg/dL (ref 0.0–0.2)
Globulin: 3.2 g/dL (calc) (ref 1.9–3.7)
Indirect Bilirubin: 0.5 mg/dL (calc) (ref 0.2–1.2)
Total Protein: 7.2 g/dL (ref 6.1–8.1)

## 2017-02-15 LAB — LIPID PANEL
CHOL/HDL RATIO: 3.1 (calc) (ref <5.0)
Cholesterol: 151 mg/dL (ref <200)
HDL: 48 mg/dL — AB (ref >50)
LDL Cholesterol (Calc): 76 mg/dL (calc)
NON-HDL CHOLESTEROL (CALC): 103 mg/dL (ref <130)
Triglycerides: 168 mg/dL — ABNORMAL HIGH (ref <150)

## 2017-02-15 LAB — TSH: TSH: 2.19 mIU/L (ref 0.40–4.50)

## 2017-02-15 LAB — HEMOGLOBIN A1C
EAG (MMOL/L): 7.6 (calc)
HEMOGLOBIN A1C: 6.4 %{Hb} — AB (ref <5.7)
Mean Plasma Glucose: 137 (calc)

## 2017-02-15 LAB — URINALYSIS, ROUTINE W REFLEX MICROSCOPIC
Bilirubin Urine: NEGATIVE
Glucose, UA: NEGATIVE
Hgb urine dipstick: NEGATIVE
KETONES UR: NEGATIVE
Leukocytes, UA: NEGATIVE
NITRITE: NEGATIVE
PH: 7.5 (ref 5.0–8.0)
Protein, ur: NEGATIVE
Specific Gravity, Urine: 1.012 (ref 1.001–1.03)

## 2017-02-15 LAB — VITAMIN D 25 HYDROXY (VIT D DEFICIENCY, FRACTURES): Vit D, 25-Hydroxy: 58 ng/mL (ref 30–100)

## 2017-02-15 LAB — MAGNESIUM: Magnesium: 2 mg/dL (ref 1.5–2.5)

## 2017-02-15 LAB — INSULIN, RANDOM: Insulin: 12.4 u[IU]/mL (ref 2.0–19.6)

## 2017-02-28 ENCOUNTER — Other Ambulatory Visit: Payer: Self-pay

## 2017-02-28 DIAGNOSIS — Z1212 Encounter for screening for malignant neoplasm of rectum: Principal | ICD-10-CM

## 2017-02-28 DIAGNOSIS — Z1211 Encounter for screening for malignant neoplasm of colon: Secondary | ICD-10-CM

## 2017-02-28 LAB — POC HEMOCCULT BLD/STL (HOME/3-CARD/SCREEN)
Card #2 Fecal Occult Blod, POC: NEGATIVE
FECAL OCCULT BLD: NEGATIVE
FECAL OCCULT BLD: NEGATIVE

## 2017-03-01 DIAGNOSIS — Z1212 Encounter for screening for malignant neoplasm of rectum: Secondary | ICD-10-CM

## 2017-05-14 DIAGNOSIS — B001 Herpesviral vesicular dermatitis: Secondary | ICD-10-CM | POA: Insufficient documentation

## 2017-05-14 NOTE — Progress Notes (Signed)
MEDICARE ANNUAL WELLNESS VISIT AND FOLLOW UP  Assessment:   Diagnoses and all orders for this visit:  Encounter for Medicare annual wellness exam  Essential hypertension Continue medication Monitor blood pressure at home; call if consistently over 130/80 Continue DASH diet.   Reminder to go to the ER if any CP, SOB, nausea, dizziness, severe HA, changes vision/speech, left arm numbness and tingling and jaw pain.  Gastroesophageal reflux disease, esophagitis presence not specified Well managed on current medications Discussed diet, avoiding triggers and other lifestyle changes  OAB (overactive bladder) Symptoms improved with vesicare, uses one liner per day  Nephrolithiasis Increase fluids   Vitamin D deficiency Near goal at recent check; continue to recommend supplementation for goal of 70-100 Defer vitamin D level  Prediabetes Discussed disease and risks Discussed diet/exercise, weight management  A1C  Medication management CBC, CMP/GFR  Mixed hyperlipidemia Continue medications: atorvastatin Continue low cholesterol diet and exercise.  Check lipid panel.   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; check LFTs  Estrogen deficiency DEXA ordered for 5 year follow up  Over 40 minutes of exam, counseling, chart review and critical decision making was performed Future Appointments  Date Time Provider [place]  08/21/2017  9:30 AM Unk Pinto, MD [facility] None  03/01/2018  9:00 AM Unk Pinto, MD [facility] None     Plan:   During the course of the visit the patient was educated and counseled about appropriate screening and preventive services  including:    Pneumococcal vaccine   Prevnar 13  Influenza vaccine  Td vaccine  Screening electrocardiogram  Bone densitometry screening  Colorectal cancer screening  Diabetes screening  Glaucoma screening  Nutrition counseling   Advanced directives: requested   Subjective:  Kaitlyn Townsend is a 72 y.o. female who presents for Medicare Annual Wellness Visit and 3 month follow up. She is followed by Dr. Matilde Sprang at [facility] urology for recurrent nephrolithiasis. She takes acyclovir as prophylaxis for cold sores. She is on topamax for residual pain from previous fracture of left femur 2011 and is doing well with this.   BMI is Body mass index is 27.96 kg/m., she has been working on diet and exercise. Wt Readings from Last 3 Encounters:  05/16/17 168 lb (76.2 kg)  02/14/17 160 lb (72.6 kg)  09/13/16 165 lb 6.4 oz (75 kg)    Her blood pressure has been controlled at home, today their BP is BP: 120/72 She does workout. She denies chest pain, shortness of breath, dizziness.   She is on cholesterol medication (atorvastatin 40 mg daily) and denies myalgias. Her LDL cholesterol is at goal; trigs remain borderline elevated. The cholesterol last visit was:   Lab Results  Component Value Date   CHOL 151 02/14/2017   HDL 48 (L) 02/14/2017   LDLCALC 76 02/14/2017   TRIG 168 (H) 02/14/2017   CHOLHDL 3.1 02/14/2017    She has been working on diet and exercise for prediabetes, and denies foot ulcerations, increased appetite, nausea, paresthesia of the feet, polydipsia, polyuria, visual disturbances, vomiting and weight loss. She does check fasting sugars, range (69-120). Last A1C in the office was:  Lab Results  Component Value Date   HGBA1C 6.4 (H) 02/14/2017   Last GFR: Lab  Results  Component Value Date   GFRNONAA 68 02/14/2017   Patient is on Vitamin D supplement and approaching goal at recent check:    Lab Results  Component Value Date   VD25OH 58 02/14/2017       Medication Review: Current Outpatient Medications on File Prior to Visit  Medication Sig Dispense Refill  . ACCU-CHEK AVIVA PLUS test strip USE STRIP TO CHECK BLOOD SUGAR ONCE DAILY 100 each 5  . acyclovir (ZOVIRAX) 800 MG tablet Take 1 tablet daily for Fever Blisters 90 tablet 3  . aspirin 81 MG chewable tablet Chew by mouth daily.    Marland Kitchen atorvastatin (LIPITOR) 80 MG tablet Take 1 tablet (80 mg total) by mouth daily. 90 tablet 1  . Blood Glucose Monitoring Suppl (ACCU-CHEK AVIVA PLUS) W/DEVICE KIT Check blood sugar 1 time daily-DX-R73.09 1 kit 0  . Cholecalciferol (VITAMIN D PO) Take 10,000 Int'l Units by mouth daily.     . enalapril (VASOTEC) 20 MG tablet TAKE 1 TABLET BY MOUTH ONCE DAILY 90 tablet 1  . estradiol (ESTRACE) 1 MG tablet TAKE 1 TABLET BY MOUTH ONCE DAILY 90 tablet 1  . hydrochlorothiazide (HYDRODIURIL) 25 MG tablet TAKE 1 TABLET BY MOUTH ONCE DAILY FOR BLOOD PRESSURE AND  FLUID 90 tablet 1  . hydrocortisone (ANUSOL-HC) 25 MG suppository Place 1 suppository (25 mg total) rectally 2 (two) times daily. For 7 days 24 suppository 3  . Lancets (ACCU-CHEK SOFT TOUCH) lancets Check blood sugar 1 time daily-DX-R73.09 100 each 12  . MAGNESIUM PO Take 500 mg by mouth daily.     . Multiple Vitamins-Minerals (MULTIVITAMIN WITH MINERALS) tablet Take 1 tablet by mouth daily.    . Omega-3 Fatty Acids (FISH OIL PO) Take by mouth daily.    . solifenacin (VESICARE) 5 MG tablet Take 5 mg by mouth daily.    Marland Kitchen topiramate (TOPAMAX) 25 MG tablet Take 1 tablet (25 mg total) by mouth at bedtime. 90 tablet 3  . levofloxacin (LEVAQUIN) 500 MG tablet Take 1 tablet daily with food for infection (Patient not taking: Reported on 05/16/2017) 15 tablet 1  . predniSONE (DELTASONE) 20 MG tablet 1 tab 3 x day for 3 days, then 1 tab 2 x day for 3 days, then 1 tab 1 x day for 5 days (Patient not taking: Reported on 05/16/2017) 20 tablet 0   No current facility-administered medications on file prior to visit.     No  Known Allergies  Current Problems (verified) Patient Active Problem List   Diagnosis Date Noted  . Recurrent cold sores 05/14/2017  . Nephrolithiasis 02/16/2016  . OAB (overactive bladder) 02/16/2016  . Encounter for Medicare annual wellness exam 10/20/2014  . Overweight (BMI 25.0-29.9) 10/20/2014  . Medication management 02/13/2013  . Hyperlipidemia   . Hypertension   . Prediabetes   . Vitamin D deficiency   . GERD (gastroesophageal reflux disease)     Screening Tests Immunization History  Administered Date(s) Administered  . Influenza Split 10/31/2012  . Influenza, High Dose Seasonal PF 09/13/2016  . Influenza-Unspecified 09/26/2014, 10/06/2015  . Pneumococcal Conjugate-13 03/09/2014  . Pneumococcal Polysaccharide-23 08/06/2015  . Td 08/09/2012  . Zoster 08/14/2012   Preventative care: Last colonoscopy: 2012 Mammogram:08/2016 Last pap smear/pelvic exam: remote; s/p total hysterectomy DEXA:2014  Prior vaccinations: TD or Tdap: 2014  Influenza: 2018 Pneumococcal: 2017 Prevnar13: 2016 Shingles/Zostavax: 2014  Names of Other Physician/Practitioners you currently use: 1. [facility] here for primary care 2. [facility], Therapist, art,  last visit 2018 3. Dr. Deatra Ina, dentist, last visit 2019  Patient Care Team: Unk Pinto, MD as PCP - General (Internal Medicine) Inda Castle, MD (Inactive) as Consulting Physician (Gastroenterology) Bjorn Loser, MD as Consulting Physician (Urology)  SURGICAL HISTORY She  has a past surgical history that includes Knee surgery (Left, 09/2009) and Abdominal hysterectomy (Bilateral, 1977). FAMILY HISTORY Her family history includes Heart disease in her brother and mother; Hypertension in her brother; Kidney disease in her brother; Ulcers in her father. SOCIAL HISTORY She  reports that she quit smoking about 23 years ago. Her smoking use included cigarettes. She has never used smokeless  tobacco. She reports that she drinks alcohol.   MEDICARE WELLNESS OBJECTIVES: Physical activity: Current Exercise Habits: Home exercise routine, Type of exercise: strength training/weights;treadmill;Other - see comments(Intensive yard work), Time (Minutes): 60, Frequency (Times/Week): 5, Weekly Exercise (Minutes/Week): 300, Intensity: Mild, Exercise limited by: None identified Cardiac risk factors: Cardiac Risk Factors include: advanced age (>8mn, >>42women);dyslipidemia;hypertension Depression/mood screen:   [facility] 05/16/2017  Decreased Interest 0  Down, Depressed, Hopeless 0  PHQ - 2 Score 0    ADLs:  In your present state of health, do you have any difficulty performing the following activities: 05/16/2017 02/14/2017  Hearing? N N  Vision? N N  Difficulty concentrating or making decisions? N N  Walking or climbing stairs? N N  Dressing or bathing? N N  Doing errands, shopping? N N  Some recent data might be hidden     Cognitive Testing  Alert? Yes  Normal Appearance?Yes  Oriented to person? Yes  Place? Yes   Time? Yes  Recall of three objects?  Yes  Can perform simple calculations? Yes  Displays appropriate judgment?Yes  Can read the correct time from a watch face?Yes  EOL planning: Does Patient Have a Medical Advance Directive?: Yes Type of Advance Directive: Healthcare Power of Attorney, Living will Does patient want to make changes to medical advance directive?: No - Patient declined Copy of [place] Chart?: Yes  Review of Systems  Constitutional: Negative for malaise/fatigue and weight loss.  HENT: Negative for hearing loss and tinnitus.   Eyes: Negative for blurred vision and double vision.  Respiratory: Negative for cough, sputum production, shortness of breath and wheezing.   Cardiovascular: Negative for chest pain, palpitations, orthopnea, claudication, leg swelling and PND.  Gastrointestinal: Negative for abdominal pain,  blood in stool, constipation, diarrhea, heartburn, melena, nausea and vomiting.  Genitourinary: Negative.   Musculoskeletal: Negative for falls, joint pain and myalgias.  Skin: Negative for rash.  Neurological: Negative for dizziness, tingling, sensory change, weakness and headaches.  Endo/Heme/Allergies: Negative for polydipsia.  Psychiatric/Behavioral: Negative.  Negative for depression, memory loss, substance abuse and suicidal ideas. The patient is not nervous/anxious and does not have insomnia.   All other systems reviewed and are negative.    Objective:     Today's Vitals   05/16/17 0917  BP: 120/72  Pulse: 74  Temp: (!) 97.5 F (36.4 C)  SpO2: 96%  Weight: 168 lb (76.2 kg)  Height: '5\' 5"'$  (1.651 m)   Body mass index is 27.96 kg/m.  General appearance: alert, no distress, WD/WN, female HEENT: normocephalic, sclerae anicteric, TMs pearly, nares patent, no discharge or erythema, pharynx normal Oral cavity: MMM, no lesions Neck: supple, no lymphadenopathy, no thyromegaly, no masses Heart: RRR, normal S1, S2, no murmurs Lungs: CTA bilaterally, no wheezes, rhonchi, or rales Abdomen: +bs, soft, non tender, non distended, no  masses, no hepatomegaly, no splenomegaly Musculoskeletal: nontender, no swelling, no obvious deformity Extremities: no edema, no cyanosis, no clubbing Pulses: 2+ symmetric, upper and lower extremities, normal cap refill Neurological: alert, oriented x 3, CN2-12 intact, strength normal upper extremities and lower extremities, sensation normal throughout, DTRs 2+ throughout, no cerebellar signs, gait normal Psychiatric: normal affect, behavior normal, pleasant   Medicare Attestation I have personally reviewed: The patient's medical and social history Their use of alcohol, tobacco or illicit drugs Their current medications and supplements The patient's functional ability including ADLs,fall risks, home safety risks, cognitive, and hearing and visual  impairment Diet and physical activities Evidence for depression or mood disorders  The patient's weight, height, BMI, and visual acuity have been recorded in the chart.  I have made referrals, counseling, and provided education to the patient based on review of the above and I have provided the patient with a written personalized care plan for preventive services.     Izora Ribas, NP   05/16/2017

## 2017-05-16 ENCOUNTER — Encounter: Payer: Self-pay | Admitting: Adult Health

## 2017-05-16 ENCOUNTER — Ambulatory Visit (INDEPENDENT_AMBULATORY_CARE_PROVIDER_SITE_OTHER): Payer: Medicare Other | Admitting: Adult Health

## 2017-05-16 VITALS — BP 120/72 | HR 74 | Temp 97.5°F | Ht 65.0 in | Wt 168.0 lb

## 2017-05-16 DIAGNOSIS — Z0001 Encounter for general adult medical examination with abnormal findings: Secondary | ICD-10-CM | POA: Diagnosis not present

## 2017-05-16 DIAGNOSIS — Z0000 Encounter for general adult medical examination without abnormal findings: Secondary | ICD-10-CM

## 2017-05-16 DIAGNOSIS — E663 Overweight: Secondary | ICD-10-CM

## 2017-05-16 DIAGNOSIS — B001 Herpesviral vesicular dermatitis: Secondary | ICD-10-CM | POA: Diagnosis not present

## 2017-05-16 DIAGNOSIS — E782 Mixed hyperlipidemia: Secondary | ICD-10-CM

## 2017-05-16 DIAGNOSIS — I10 Essential (primary) hypertension: Secondary | ICD-10-CM

## 2017-05-16 DIAGNOSIS — N3281 Overactive bladder: Secondary | ICD-10-CM | POA: Diagnosis not present

## 2017-05-16 DIAGNOSIS — K219 Gastro-esophageal reflux disease without esophagitis: Secondary | ICD-10-CM | POA: Diagnosis not present

## 2017-05-16 DIAGNOSIS — R7303 Prediabetes: Secondary | ICD-10-CM | POA: Diagnosis not present

## 2017-05-16 DIAGNOSIS — N200 Calculus of kidney: Secondary | ICD-10-CM | POA: Diagnosis not present

## 2017-05-16 DIAGNOSIS — R6889 Other general symptoms and signs: Secondary | ICD-10-CM

## 2017-05-16 DIAGNOSIS — Z79899 Other long term (current) drug therapy: Secondary | ICD-10-CM

## 2017-05-16 DIAGNOSIS — E2839 Other primary ovarian failure: Secondary | ICD-10-CM | POA: Diagnosis not present

## 2017-05-16 DIAGNOSIS — E559 Vitamin D deficiency, unspecified: Secondary | ICD-10-CM

## 2017-05-16 LAB — COMPLETE METABOLIC PANEL WITH GFR
AG RATIO: 1.6 (calc) (ref 1.0–2.5)
ALBUMIN MSPROF: 4.2 g/dL (ref 3.6–5.1)
ALT: 25 U/L (ref 6–29)
AST: 26 U/L (ref 10–35)
Alkaline phosphatase (APISO): 68 U/L (ref 33–130)
BUN: 11 mg/dL (ref 7–25)
CALCIUM: 9.6 mg/dL (ref 8.6–10.4)
CO2: 31 mmol/L (ref 20–32)
CREATININE: 0.77 mg/dL (ref 0.60–0.93)
Chloride: 102 mmol/L (ref 98–110)
GFR, EST AFRICAN AMERICAN: 89 mL/min/{1.73_m2} (ref >=60)
GFR, EST NON AFRICAN AMERICAN: 77 mL/min/{1.73_m2} (ref >=60)
GLOBULIN: 2.6 g/dL (ref 1.9–3.7)
Glucose, Bld: 112 mg/dL — ABNORMAL HIGH (ref 65–99)
POTASSIUM: 3.9 mmol/L (ref 3.5–5.3)
SODIUM: 139 mmol/L (ref 135–146)
TOTAL PROTEIN: 6.8 g/dL (ref 6.1–8.1)
Total Bilirubin: 0.7 mg/dL (ref 0.2–1.2)

## 2017-05-16 LAB — LIPID PANEL
CHOL/HDL RATIO: 3.3 (calc) (ref <5.0)
CHOLESTEROL: 154 mg/dL (ref <200)
HDL: 46 mg/dL — ABNORMAL LOW (ref >50)
LDL CHOLESTEROL (CALC): 84 mg/dL
Non-HDL Cholesterol (Calc): 108 mg/dL (calc) (ref <130)
Triglycerides: 147 mg/dL (ref <150)

## 2017-05-16 LAB — CBC WITH DIFFERENTIAL/PLATELET
BASOS ABS: 40 {cells}/uL (ref 0–200)
BASOS PCT: 1 %
EOS ABS: 168 {cells}/uL (ref 15–500)
Eosinophils Relative: 4.2 %
HCT: 40.4 % (ref 35.0–45.0)
Hemoglobin: 13.4 g/dL (ref 11.7–15.5)
Lymphs Abs: 1556 cells/uL (ref 850–3900)
MCH: 28.2 pg (ref 27.0–33.0)
MCHC: 33.2 g/dL (ref 32.0–36.0)
MCV: 84.9 fL (ref 80.0–100.0)
MONOS PCT: 12 %
MPV: 10.8 fL (ref 7.5–12.5)
Neutro Abs: 1756 cells/uL (ref 1500–7800)
Neutrophils Relative %: 43.9 %
PLATELETS: 253 10*3/uL (ref 140–400)
RBC: 4.76 10*6/uL (ref 3.80–5.10)
RDW: 13.8 % (ref 11.0–15.0)
TOTAL LYMPHOCYTE: 38.9 %
WBC: 4 10*3/uL (ref 3.8–10.8)
WBCMIX: 480 {cells}/uL (ref 200–950)

## 2017-05-16 LAB — TSH: TSH: 2.29 m[IU]/L (ref 0.40–4.50)

## 2017-05-16 NOTE — Patient Instructions (Signed)
Aim for 7+ servings of fruits and vegetables daily  80+ fluid ounces of water or unsweet tea for healthy kidneys  Limit alcohol intake   Limit animal fats in diet for cholesterol and heart health - choose grass fed whenever available  Aim for low stress - take time to unwind and care for your mental health  Aim for 150 min of moderate intensity exercise weekly for heart health, and weights twice weekly for bone health  Aim for 7-9 hours of sleep daily    Preventing Type 2 Diabetes Mellitus Type 2 diabetes (type 2 diabetes mellitus) is a long-term (chronic) disease that affects blood sugar (glucose) levels. Normally, a hormone called insulin allows glucose to enter cells in the body. The cells use glucose for energy. In type 2 diabetes, one or both of these problems may be present:  The body does not make enough insulin.  The body does not respond properly to insulin that it makes (insulin resistance).  Insulin resistance or lack of insulin causes excess glucose to build up in the blood instead of going into cells. As a result, high blood glucose (hyperglycemia) develops, which can cause many complications. Being overweight or obese and having an inactive (sedentary) lifestyle can increase your risk for diabetes. Type 2 diabetes can be delayed or prevented by making certain nutrition and lifestyle changes. What nutrition changes can be made?  Eat healthy meals and snacks regularly. Keep a healthy snack with you for when you get hungry between meals, such as fruit or a handful of nuts.  Eat lean meats and proteins that are low in saturated fats, such as chicken, fish, egg whites, and beans. Avoid processed meats.  Eat plenty of fruits and vegetables and plenty of grains that have not been processed (whole grains). It is recommended that you eat: ? 1?2 cups of fruit every day. ? 2?3 cups of vegetables every day. ? 6?8 oz of whole grains every day, such as oats, whole wheat, bulgur,  brown rice, quinoa, and millet.  Eat low-fat dairy products, such as milk, yogurt, and cheese.  Eat foods that contain healthy fats, such as nuts, avocado, olive oil, and canola oil.  Drink water throughout the day. Avoid drinks that contain added sugar, such as soda or sweet tea.  Follow instructions from your health care provider about specific eating or drinking restrictions.  Control how much food you eat at a time (portion size). ? Check food labels to find out the serving sizes of foods. ? Use a kitchen scale to weigh amounts of foods.  Saute or steam food instead of frying it. Cook with water or broth instead of oils or butter.  Limit your intake of: ? Salt (sodium). Have no more than 1 tsp (2,400 mg) of sodium a day. If you have heart disease or high blood pressure, have less than ? tsp (1,500 mg) of sodium a day. ? Saturated fat. This is fat that is solid at room temperature, such as butter or fat on meat. What lifestyle changes can be made?  Activity  Do moderate-intensity physical activity for at least 30 minutes on at least 5 days of the week, or as much as told by your health care provider.  Ask your health care provider what activities are safe for you. A mix of physical activities may be best, such as walking, swimming, cycling, and strength training.  Try to add physical activity into your day. For example: ? Park in spots that are farther  away than usual, so that you walk more. For example, park in a far corner of the parking lot when you go to the office or the grocery store. ? Take a walk during your lunch break. ? Use stairs instead of elevators or escalators. Weight Loss  Lose weight as directed. Your health care provider can determine how much weight loss is best for you and can help you lose weight safely.  If you are overweight or obese, you may be instructed to lose at least 5?7 % of your body weight. Alcohol and Tobacco   Limit alcohol intake to no  more than 1 drink a day for nonpregnant women and 2 drinks a day for men. One drink equals 12 oz of beer, 5 oz of wine, or 1 oz of hard liquor.  Do not use any tobacco products, such as cigarettes, chewing tobacco, and e-cigarettes. If you need help quitting, ask your health care provider. Work With [place] Provider  Have your blood glucose tested regularly, as told by your health care provider.  Discuss your risk factors and how you can reduce your risk for diabetes.  Get screening tests as told by your health care provider. You may have screening tests regularly, especially if you have certain risk factors for type 2 diabetes.  Make an appointment with a diet and nutrition specialist (registered dietitian). A registered dietitian can help you make a healthy eating plan and can help you understand portion sizes and food labels. Why are these changes important?  It is possible to prevent or delay type 2 diabetes and related health problems by making lifestyle and nutrition changes.  It can be difficult to recognize signs of type 2 diabetes. The best way to avoid possible damage to your body is to take actions to prevent the disease before you develop symptoms. What can happen if changes are not made?  Your blood glucose levels may keep increasing. Having high blood glucose for a long time is dangerous. Too much glucose in your blood can damage your blood vessels, heart, kidneys, nerves, and eyes.  You may develop prediabetes or type 2 diabetes. Type 2 diabetes can lead to many chronic health problems and complications, such as: ? Heart disease. ? Stroke. ? Blindness. ? Kidney disease. ? Depression. ? Poor circulation in the feet and legs, which could lead to surgical removal (amputation) in severe cases. Where to find support:  Ask your health care provider to recommend a registered dietitian, diabetes educator, or weight loss program.  Look for local or online weight loss  groups.  Join a gym, fitness club, or outdoor activity group, such as a walking club. Where to find more information: To learn more about diabetes and diabetes prevention, visit:  American Diabetes Association (ADA): www.diabetes.[employer] of Diabetes and Digestive and Kidney Diseases: FindSpin.nl  To learn more about healthy eating, visit:  The U.S. Department of Agriculture Scientist, research (physical sciences)), Choose My Plate: http://wiley-williams.com/  Office of Disease Prevention and Health Promotion (ODPHP), Dietary Guidelines: SurferLive.at  Summary  You can reduce your risk for type 2 diabetes by increasing your physical activity, eating healthy foods, and losing weight as directed.  Talk with your health care provider about your risk for type 2 diabetes. Ask about any blood tests or screening tests that you need to have. This information is not intended to replace advice given to you by your health care provider. Make sure you discuss any questions you have with your health care  provider. Document Released: 04/26/2015 Document Revised: 06/10/2015 Document Reviewed: 02/23/2015 Elsevier Interactive Patient Education  [employer].

## 2017-05-17 ENCOUNTER — Other Ambulatory Visit: Payer: Self-pay | Admitting: Adult Health

## 2017-05-17 DIAGNOSIS — Z1231 Encounter for screening mammogram for malignant neoplasm of breast: Secondary | ICD-10-CM

## 2017-05-17 LAB — HEMOGLOBIN A1C
EAG (MMOL/L): 7.1 (calc)
HEMOGLOBIN A1C: 6.1 %{Hb} — AB (ref <5.7)
MEAN PLASMA GLUCOSE: 128 (calc)

## 2017-06-18 ENCOUNTER — Other Ambulatory Visit: Payer: Self-pay | Admitting: Internal Medicine

## 2017-07-09 ENCOUNTER — Other Ambulatory Visit: Payer: Self-pay | Admitting: Internal Medicine

## 2017-08-21 ENCOUNTER — Encounter: Payer: Self-pay | Admitting: Internal Medicine

## 2017-08-21 ENCOUNTER — Ambulatory Visit (INDEPENDENT_AMBULATORY_CARE_PROVIDER_SITE_OTHER): Payer: Medicare Other | Admitting: Internal Medicine

## 2017-08-21 VITALS — BP 126/86 | HR 80 | Temp 97.2°F | Resp 16 | Ht 65.0 in | Wt 166.6 lb

## 2017-08-21 DIAGNOSIS — Z8249 Family history of ischemic heart disease and other diseases of the circulatory system: Secondary | ICD-10-CM | POA: Insufficient documentation

## 2017-08-21 DIAGNOSIS — R7309 Other abnormal glucose: Secondary | ICD-10-CM | POA: Diagnosis not present

## 2017-08-21 DIAGNOSIS — I10 Essential (primary) hypertension: Secondary | ICD-10-CM | POA: Diagnosis not present

## 2017-08-21 DIAGNOSIS — R7303 Prediabetes: Secondary | ICD-10-CM

## 2017-08-21 DIAGNOSIS — E559 Vitamin D deficiency, unspecified: Secondary | ICD-10-CM

## 2017-08-21 DIAGNOSIS — E782 Mixed hyperlipidemia: Secondary | ICD-10-CM | POA: Diagnosis not present

## 2017-08-21 DIAGNOSIS — J301 Allergic rhinitis due to pollen: Secondary | ICD-10-CM

## 2017-08-21 DIAGNOSIS — Z79899 Other long term (current) drug therapy: Secondary | ICD-10-CM

## 2017-08-21 DIAGNOSIS — Z87891 Personal history of nicotine dependence: Secondary | ICD-10-CM | POA: Insufficient documentation

## 2017-08-21 MED ORDER — MONTELUKAST SODIUM 10 MG PO TABS: ORAL_TABLET | ORAL | 3 refills | Status: DC

## 2017-08-21 NOTE — Progress Notes (Signed)
This very nice 72 y.o. MBF presents for 6 month follow up with HTN, HLD, Pre-Diabetes and Vitamin D Deficiency.      Patient is treated for HTN (1995) & BP has been controlled at home. Today's BP is at goal - 126/86. Patient has had no complaints of any cardiac type chest pain, palpitations, dyspnea / orthopnea / PND, dizziness, claudication, or dependent edema.     Hyperlipidemia is controlled with diet & meds. Patient denies myalgias or other med SE's. Last Lipids were at goal albeit elevated Trig's: Lab Results  Component Value Date   CHOL 162 08/21/2017   HDL 47 (L) 08/21/2017   LDLCALC 90 08/21/2017   TRIG 150 (H) 08/21/2017   CHOLHDL 3.4 08/21/2017      Also, the patient has history of PreDiabetes (A1c 6.0%/2011) and then T2_DM in 2016 (A1c 7.4%). She is attempting dietary control. has had no symptoms of reactive hypoglycemia, diabetic polys, paresthesias or visual blurring.  Current A1c is still not at goal:  Lab Results  Component Value Date   HGBA1C 6.5 (H) 08/21/2017      Further, the patient also has history of Vitamin D Deficiency and supplements vitamin D without any suspected side-effects. Current  vitamin D is sl elevated & dose is tapered.  Lab Results  Component Value Date   VD25OH 109 (H) 08/21/2017   Current Outpatient Medications on File Prior to Visit  Medication Sig  . ACCU-CHEK AVIVA PLUS test strip USE STRIP TO CHECK BLOOD SUGAR ONCE DAILY  . acyclovir (ZOVIRAX) 800 MG tablet Take 1 tablet daily for Fever Blisters  . aspirin 81 MG chewable tablet Chew by mouth daily.  Marland Kitchen atorvastatin (LIPITOR) 80 MG tablet Take 1 tablet (80 mg total) by mouth daily.  . Blood Glucose Monitoring Suppl (ACCU-CHEK AVIVA PLUS) W/DEVICE KIT Check blood sugar 1 time daily-DX-R73.09  . Cholecalciferol (VITAMIN D PO) Take 10,000 Int'l Units by mouth daily.   . enalapril (VASOTEC) 20 MG tablet TAKE 1 TABLET BY MOUTH ONCE DAILY  . estradiol (ESTRACE) 1 MG tablet TAKE 1 TABLET BY  MOUTH ONCE DAILY  . hydrochlorothiazide (HYDRODIURIL) 25 MG tablet TAKE 1 TABLET BY MOUTH ONCE DAILY FOR BLOOD PRESSURE AND  FLIUD  . hydrocortisone (ANUSOL-HC) 25 MG suppository Place 1 suppository (25 mg total) rectally 2 (two) times daily. For 7 days  . Lancets (ACCU-CHEK SOFT TOUCH) lancets Check blood sugar 1 time daily-DX-R73.09  . MAGNESIUM PO Take 500 mg by mouth daily.   . Multiple Vitamins-Minerals (MULTIVITAMIN WITH MINERALS) tablet Take 1 tablet by mouth daily.  . Omega-3 Fatty Acids (FISH OIL PO) Take by mouth daily.  . solifenacin (VESICARE) 5 MG tablet Take 5 mg by mouth daily.  Marland Kitchen topiramate (TOPAMAX) 25 MG tablet Take 1 tablet (25 mg total) by mouth at bedtime.   No current facility-administered medications on file prior to visit.    No Known Allergies PMHx:   Past Medical History:  Diagnosis Date  . Allergy   . GERD (gastroesophageal reflux disease)   . Hyperlipidemia   . Hypertension   . Prediabetes   . Vitamin D deficiency    Immunization History  Administered Date(s) Administered  . Influenza Split 10/31/2012  . Influenza, High Dose Seasonal PF 09/13/2016  . Influenza-Unspecified 09/26/2014, 10/06/2015  . Pneumococcal Conjugate-13 03/09/2014  . Pneumococcal Polysaccharide-23 08/06/2015  . Td 08/09/2012  . Zoster 08/14/2012   Past Surgical History:  Procedure Laterality Date  . ABDOMINAL HYSTERECTOMY Bilateral  1977   bso  . KNEE SURGERY Left 09/2009   FHx:    Reviewed / unchanged  SHx:    Reviewed / unchanged   Systems Review:  Constitutional: Denies fever, chills, wt changes, headaches, insomnia, fatigue, night sweats, change in appetite. Eyes: Denies redness, blurred vision, diplopia, discharge, itchy, watery eyes.  ENT: Denies discharge, congestion, post nasal drip, epistaxis, sore throat, earache, hearing loss, dental pain, tinnitus, vertigo, sinus pain, snoring.  CV: Denies chest pain, palpitations, irregular heartbeat, syncope, dyspnea,  diaphoresis, orthopnea, PND, claudication or edema. Respiratory: denies cough, dyspnea, DOE, pleurisy, hoarseness, laryngitis, wheezing.  Gastrointestinal: Denies dysphagia, odynophagia, heartburn, reflux, water brash, abdominal pain or cramps, nausea, vomiting, bloating, diarrhea, constipation, hematemesis, melena, hematochezia  or hemorrhoids. Genitourinary: Denies dysuria, frequency, urgency, nocturia, hesitancy, discharge, hematuria or flank pain. Musculoskeletal: Denies arthralgias, myalgias, stiffness, jt. swelling, pain, limping or strain/sprain.  Skin: Denies pruritus, rash, hives, warts, acne, eczema or change in skin lesion(s). Neuro: No weakness, tremor, incoordination, spasms, paresthesia or pain. Psychiatric: Denies confusion, memory loss or sensory loss. Endo: Denies change in weight, skin or hair change.  Heme/Lymph: No excessive bleeding, bruising or enlarged lymph nodes.  Physical Exam  BP 126/86   Pulse 80   Temp (!) 97.2 F (36.2 C)   Resp 16   Ht '5\' 5"'$  (1.651 m)   Wt 166 lb 9.6 oz (75.6 kg)   BMI 27.72 kg/m   Appears  well nourished, well groomed  and in no distress.  Eyes: PERRLA, EOMs, conjunctiva no swelling or erythema. Sinuses: No frontal/maxillary tenderness ENT/Mouth: EAC's clear, TM's nl w/o erythema, bulging. Nares clear w/o erythema, swelling, exudates. Oropharynx clear without erythema or exudates. Oral hygiene is good. Tongue normal, non obstructing. Hearing intact.  Neck: Supple. Thyroid not palpable. Car 2+/2+ without bruits, nodes or JVD. Chest: Respirations nl with BS clear & equal w/o rales, rhonchi, wheezing or stridor.  Cor: Heart sounds normal w/ regular rate and rhythm without sig. murmurs, gallops, clicks or rubs. Peripheral pulses normal and equal  without edema.  Abdomen: Soft & bowel sounds normal. Non-tender w/o guarding, rebound, hernias, masses or organomegaly.  Lymphatics: Unremarkable.  Musculoskeletal: Full ROM all peripheral  extremities, joint stability, 5/5 strength and normal gait.  Skin: Warm, dry without exposed rashes, lesions or ecchymosis apparent.  Neuro: Cranial nerves intact, reflexes equal bilaterally. Sensory-motor testing grossly intact. Tendon reflexes grossly intact.  Pysch: Alert & oriented x 3.  Insight and judgement nl & appropriate. No ideations.  Assessment and Plan:  1. Essential hypertension  - Continue medication, monitor blood pressure at home.  - Continue DASH diet.  Reminder to go to the ER if any CP,  SOB, nausea, dizziness, severe HA, changes vision/speech.  - CBC with Differential/Platelet - COMPLETE METABOLIC PANEL WITH GFR - Magnesium - TSH  2. Hyperlipidemia, mixed  - Continue diet/meds, exercise,& lifestyle modifications.  - Continue monitor periodic cholesterol/liver & renal functions   - Lipid panel - Hemoglobin A1c  3. Abnormal glucose  - Continue diet, exercise,  lifestyle modifications.  - Monitor appropriate labs.  - Insulin, random - Hemoglobin A1c  4. Vitamin D deficiency  - Continue supplementation.  - VITAMIN D 25 Hydroxyl  5. Prediabetes  - Insulin, random - Hemoglobin A1c  6. Medication management  - CBC with Differential/Platelet - COMPLETE METABOLIC PANEL WITH GFR - Magnesium - Lipid panel - TSH - Insulin, random - VITAMIN D 25 Hydroxyl - Hemoglobin A1c  7. Seasonal allergic rhinitis due to pollen  -  montelukast (SINGULAIR) 10 MG tablet; Take 1 tablet daily for Allergies  Dispense: 90 tablet; Refill: 3      Discussed  regular exercise, BP monitoring, weight control to achieve/maintain BMI less than 25 and discussed med and SE's. Recommended labs to assess and monitor clinical status with further disposition pending results of labs. Over 30 minutes of exam, counseling, chart review was performed.

## 2017-08-21 NOTE — Patient Instructions (Signed)

## 2017-08-22 LAB — CBC WITH DIFFERENTIAL/PLATELET
BASOS PCT: 1.1 %
Basophils Absolute: 52 cells/uL (ref 0–200)
EOS PCT: 3.2 %
Eosinophils Absolute: 150 cells/uL (ref 15–500)
HCT: 41.7 % (ref 35.0–45.0)
Hemoglobin: 13.8 g/dL (ref 11.7–15.5)
LYMPHS ABS: 1551 {cells}/uL (ref 850–3900)
MCH: 28.2 pg (ref 27.0–33.0)
MCHC: 33.1 g/dL (ref 32.0–36.0)
MCV: 85.1 fL (ref 80.0–100.0)
MONOS PCT: 9.9 %
MPV: 10.6 fL (ref 7.5–12.5)
NEUTROS ABS: 2482 {cells}/uL (ref 1500–7800)
NEUTROS PCT: 52.8 %
PLATELETS: 274 10*3/uL (ref 140–400)
RBC: 4.9 10*6/uL (ref 3.80–5.10)
RDW: 13.6 % (ref 11.0–15.0)
TOTAL LYMPHOCYTE: 33 %
WBC: 4.7 10*3/uL (ref 3.8–10.8)
WBCMIX: 465 {cells}/uL (ref 200–950)

## 2017-08-22 LAB — COMPLETE METABOLIC PANEL WITH GFR
AG Ratio: 1.5 (calc) (ref 1.0–2.5)
ALT: 27 U/L (ref 6–29)
AST: 27 U/L (ref 10–35)
Albumin: 4.3 g/dL (ref 3.6–5.1)
Alkaline phosphatase (APISO): 85 U/L (ref 33–130)
BUN: 8 mg/dL (ref 7–25)
CALCIUM: 10 mg/dL (ref 8.6–10.4)
CO2: 30 mmol/L (ref 20–32)
Chloride: 102 mmol/L (ref 98–110)
Creat: 0.81 mg/dL (ref 0.60–0.93)
GFR, EST AFRICAN AMERICAN: 84 mL/min/{1.73_m2} (ref >=60)
GFR, EST NON AFRICAN AMERICAN: 73 mL/min/{1.73_m2} (ref >=60)
GLUCOSE: 122 mg/dL — AB (ref 65–99)
Globulin: 2.8 g/dL (calc) (ref 1.9–3.7)
POTASSIUM: 4 mmol/L (ref 3.5–5.3)
Sodium: 141 mmol/L (ref 135–146)
TOTAL PROTEIN: 7.1 g/dL (ref 6.1–8.1)
Total Bilirubin: 0.6 mg/dL (ref 0.2–1.2)

## 2017-08-22 LAB — HEMOGLOBIN A1C
Hgb A1c MFr Bld: 6.5 % of total Hgb — ABNORMAL HIGH (ref <5.7)
Mean Plasma Glucose: 140 (calc)
eAG (mmol/L): 7.7 (calc)

## 2017-08-22 LAB — LIPID PANEL
CHOL/HDL RATIO: 3.4 (calc) (ref <5.0)
CHOLESTEROL: 162 mg/dL (ref <200)
HDL: 47 mg/dL — AB (ref >50)
LDL Cholesterol (Calc): 90 mg/dL (calc)
NON-HDL CHOLESTEROL (CALC): 115 mg/dL (ref <130)
TRIGLYCERIDES: 150 mg/dL — AB (ref <150)

## 2017-08-22 LAB — TSH: TSH: 1.91 mIU/L (ref 0.40–4.50)

## 2017-08-22 LAB — INSULIN, RANDOM: Insulin: 11.9 u[IU]/mL (ref 2.0–19.6)

## 2017-08-22 LAB — VITAMIN D 25 HYDROXY (VIT D DEFICIENCY, FRACTURES): Vit D, 25-Hydroxy: 109 ng/mL — ABNORMAL HIGH (ref 30–100)

## 2017-08-22 LAB — MAGNESIUM: Magnesium: 1.8 mg/dL (ref 1.5–2.5)

## 2017-08-26 ENCOUNTER — Encounter: Payer: Self-pay | Admitting: Internal Medicine

## 2017-09-18 ENCOUNTER — Other Ambulatory Visit: Payer: Medicare Other

## 2017-09-18 ENCOUNTER — Ambulatory Visit: Payer: Medicare Other

## 2017-11-13 ENCOUNTER — Ambulatory Visit
Admission: RE | Admit: 2017-11-13 | Discharge: 2017-11-13 | Disposition: A | Discharge status: Home / Self Care | Payer: Medicare Other | Source: Ambulatory Visit | Attending: Adult Health | Admitting: Adult Health

## 2017-11-13 DIAGNOSIS — Z1231 Encounter for screening mammogram for malignant neoplasm of breast: Secondary | ICD-10-CM

## 2017-11-13 DIAGNOSIS — E2839 Other primary ovarian failure: Secondary | ICD-10-CM

## 2017-11-22 DIAGNOSIS — E1122 Type 2 diabetes mellitus with diabetic chronic kidney disease: Secondary | ICD-10-CM

## 2017-11-22 DIAGNOSIS — E0822 Diabetes mellitus due to underlying condition with diabetic chronic kidney disease: Secondary | ICD-10-CM | POA: Insufficient documentation

## 2017-11-22 NOTE — Progress Notes (Signed)
Assessment and Plan:  Hypertension -Continue medication, monitor blood pressure at home. Continue DASH diet.  Reminder to go to the ER if any CP, SOB, nausea, dizziness, severe HA, changes vision/speech, left arm numbness and tingling and jaw pain.  Cholesterol -Continue diet and exercise. Check cholesterol.  Will increase to '80mg'$  daily from every other day   diabetes  -Continue diet and exercise. Check A1C Will start metformin   Vitamin D Def - check level and continue medications.  Continue diet and meds as discussed. Further disposition pending results of labs. Over 30 minutes of exam, counseling, chart review, and critical decision making was performed  HPI 72 y.o. female  presents for 3 month follow up on hypertension, cholesterol, prediabetes, and vitamin D deficiency.   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 She denies chest pain, shortness of breath, dizziness. She can only take 500 mg of magnesium due to diarrhea/hemorrhiods.   She is on cholesterol medication, lipitor 80 every other day and denies myalgias. Her cholesterol is not at goal. The cholesterol last visit was:   Lab Results  Component Value Date   CHOL 162 08/21/2017   HDL 47 (L) 08/21/2017   LDLCALC 90 08/21/2017   TRIG 150 (H) 08/21/2017   CHOLHDL 3.4 08/21/2017    She has been working on diet and exercise for prediabetes x 2011, but recently in DM range x 2018, , and denies paresthesia of the feet, polydipsia, polyuria and visual disturbances. Last A1C in the office was:  Lab Results  Component Value Date   HGBA1C 6.5 (H) 08/21/2017   Lab Results  Component Value Date   GFRAA 84 08/21/2017   Patient is on Vitamin D supplement.   Lab Results  Component Value Date   VD25OH 109 (H) 08/21/2017     BMI is Body mass index is 27.79 kg/m., she is working on diet and exercise. Wt Readings from  Last 3 Encounters:  11/23/17 167 lb (75.8 kg)  08/21/17 166 lb 9.6 oz (75.6 kg)  05/16/17 168 lb (76.2 kg)    Current Medications:  Current Outpatient Medications on File Prior to Visit  Medication Sig Dispense Refill  . ACCU-CHEK AVIVA PLUS test strip USE STRIP TO CHECK BLOOD SUGAR ONCE DAILY 100 each 5  . acyclovir (ZOVIRAX) 800 MG tablet Take 1 tablet daily for Fever Blisters 90 tablet 3  . aspirin 81 MG chewable tablet Chew by mouth daily.    Marland Kitchen atorvastatin (LIPITOR) 80 MG tablet Take 1 tablet (80 mg total) by mouth daily. 90 tablet 1  . Blood Glucose Monitoring Suppl (ACCU-CHEK AVIVA PLUS) W/DEVICE KIT Check blood sugar 1 time daily-DX-R73.09 1 kit 0  . Cholecalciferol (VITAMIN D PO) Take 10,000 Int'l Units by mouth daily.     . enalapril (VASOTEC) 20 MG tablet TAKE 1 TABLET BY MOUTH ONCE DAILY 90 tablet 1  . estradiol (ESTRACE) 1 MG tablet TAKE 1 TABLET BY MOUTH ONCE DAILY 90 tablet 1  . hydrochlorothiazide (HYDRODIURIL) 25 MG tablet TAKE 1 TABLET BY MOUTH ONCE DAILY FOR BLOOD PRESSURE AND  FLIUD 90 tablet 1  . hydrocortisone (ANUSOL-HC) 25 MG suppository Place 1 suppository (25 mg total) rectally 2 (two) times daily. For 7 days 24 suppository 3  . Lancets (ACCU-CHEK SOFT TOUCH) lancets Check blood sugar 1 time daily-DX-R73.09 100 each 12  . MAGNESIUM PO Take 500 mg by  mouth daily.     . montelukast (SINGULAIR) 10 MG tablet Take 1 tablet daily for Allergies 90 tablet 3  . Multiple Vitamins-Minerals (MULTIVITAMIN WITH MINERALS) tablet Take 1 tablet by mouth daily.    . Omega-3 Fatty Acids (FISH OIL PO) Take by mouth daily.    . solifenacin (VESICARE) 5 MG tablet Take 5 mg by mouth daily.    Marland Kitchen topiramate (TOPAMAX) 25 MG tablet Take 1 tablet (25 mg total) by mouth at bedtime. 90 tablet 3   No current facility-administered medications on file prior to visit.    Medical History:  Past Medical History:  Diagnosis Date  . Allergy   . GERD (gastroesophageal reflux disease)   .  Hyperlipidemia   . Hypertension   . Prediabetes   . Vitamin D deficiency    Allergies: No Known Allergies   Review of Systems:  Review of Systems  Constitutional: Negative.   HENT: Negative.   Eyes: Negative.   Respiratory: Negative.   Cardiovascular: Negative.   Gastrointestinal: Negative.   Genitourinary: Negative.   Musculoskeletal: Negative for back pain, falls, joint pain, myalgias and neck pain.  Skin: Negative.   Neurological: Negative.  Negative for dizziness, tingling, tremors, sensory change, speech change, focal weakness, seizures and loss of consciousness.  Endo/Heme/Allergies: Negative.   Psychiatric/Behavioral: Negative.     Family history- Review and unchanged Social history- Review and unchanged Physical Exam: BP 136/84   Pulse 83   Temp (!) 97.2 F (36.2 C)   Ht '5\' 5"'$  (1.651 m)   Wt 167 lb (75.8 kg)   SpO2 98%   BMI 27.79 kg/m  Wt Readings from Last 3 Encounters:  11/23/17 167 lb (75.8 kg)  08/21/17 166 lb 9.6 oz (75.6 kg)  05/16/17 168 lb (76.2 kg)   General Appearance: Well nourished, in no apparent distress. Eyes: PERRLA, EOMs, conjunctiva no swelling or erythema Sinuses: No Frontal/maxillary tenderness ENT/Mouth: Ext aud canals clear, TMs without erythema, bulging. No erythema, swelling, or exudate on post pharynx.  Tonsils not swollen or erythematous. Hearing normal.  Neck: Supple, thyroid normal.  Respiratory: Respiratory effort normal, BS equal bilaterally without rales, rhonchi, wheezing or stridor.  Cardio: RRR with systolic 1/6 murmur radiation to carotids, Brisk peripheral pulses without edema.  Abdomen: Soft, + BS,  Non tender, no guarding, rebound, hernias, masses. Lymphatics: Non tender without lymphadenopathy.  Musculoskeletal: Full ROM, 5/5 strength, Normal gait. Skin: Warm, dry without rashes, lesions, ecchymosis.  Neuro: Cranial nerves intact. Normal muscle tone, no cerebellar symptoms. Psych: Awake and oriented X 3, normal  affect, Insight and Judgment appropriate.    Vicie Mutters, PA-C 10:53 AM [facility]

## 2017-11-23 ENCOUNTER — Encounter: Payer: Self-pay | Admitting: Physician Assistant

## 2017-11-23 ENCOUNTER — Ambulatory Visit (INDEPENDENT_AMBULATORY_CARE_PROVIDER_SITE_OTHER): Payer: Medicare Other | Admitting: Physician Assistant

## 2017-11-23 VITALS — BP 136/84 | HR 83 | Temp 97.2°F | Ht 65.0 in | Wt 167.0 lb

## 2017-11-23 DIAGNOSIS — N181 Chronic kidney disease, stage 1: Secondary | ICD-10-CM | POA: Diagnosis not present

## 2017-11-23 DIAGNOSIS — E782 Mixed hyperlipidemia: Secondary | ICD-10-CM | POA: Diagnosis not present

## 2017-11-23 DIAGNOSIS — I10 Essential (primary) hypertension: Secondary | ICD-10-CM

## 2017-11-23 DIAGNOSIS — E1122 Type 2 diabetes mellitus with diabetic chronic kidney disease: Secondary | ICD-10-CM

## 2017-11-23 MED ORDER — METFORMIN HCL ER 500 MG PO TB24: ORAL_TABLET | ORAL | 3 refills | Status: DC

## 2017-11-23 NOTE — Patient Instructions (Signed)
Increase lipitor to daily  We are starting you on Metformin to treat diabetes.  Metformin does NOT cause low blood sugars.   In order to create energy your cells need insulin and sugar but sometime your cells do not accept the insulin and this can cause increased sugars and decreased energy.   The Metformin helps your cells accept insulin and the sugar this help: 1) increase your energy  2) weight loss.    The two most common side effects are nausea and diarrhea, follow these rules to avoid it but these symptoms get better with time on the medication.    ALSO You can take imodium per box instructions when starting metformin if needed.   Rules of metformin: 1) start out slow with only one pill daily. Our goal for you is 4 pills a day or 2000mg  total.  2) take with your largest meal. 3) Take with least amount of carbs.   Call if you have any problems.      Bad carbs also include fruit juice, alcohol, and sweet tea. These are empty calories that do not signal to your brain that you are full.   Please remember the good carbs are still carbs which convert into sugar. So please measure them out no more than 1/2-1 cup of rice, oatmeal, pasta, and beans  Veggies are however free foods! Pile them on.   Not all fruit is created equal. Please see the list below, the fruit at the bottom is higher in sugars than the fruit at the top. Please avoid all dried fruits.    Diabetes is a very complicated disease...lets simplify it.  An easy way to look at it to understand the complications is if you think of the extra sugar floating in your blood stream as glass shards floating through your blood stream.    Diabetes affects your small vessels first: 1) The glass shards (sugar) scraps down the tiny blood vessels in your eyes and lead to diabetic retinopathy, the leading cause of blindness in the [place]. Diabetes is the leading cause of newly diagnosed adult (66 to 73 years of age) blindness in the  [place].  2) The glass shards scratches down the tiny vessels of your legs leading to nerve damage called neuropathy and can lead to amputations of your feet. More than 60% of all non-traumatic amputations of lower limbs occur in people with diabetes.  3) Over time the small vessels in your brain are shredded and closed off, individually this does not cause any problems but over a long period of time many of the small vessels being blocked can lead to Vascular Dementia.   4) Your kidney's are a filter system and have a "net" that keeps certain things in the body and lets bad things out. Sugar shreds this net and leads to kidney damage and eventually failure. Decreasing the sugar that is destroying the net and certain blood pressure medications can help stop or decrease progression of kidney disease. Diabetes was the primary cause of kidney failure in 44 percent of all new cases in 2011.  5) Diabetes also destroys the small vessels in your penis that lead to erectile dysfunction. Eventually the vessels are so damaged that you may not be responsive to cialis or viagra.   Diabetes and your large vessels: Your larger vessels consist of your coronary arteries in your heart and the carotid vessels to your brain. Diabetes or even increased sugars put you at 300% increased risk of heart  attack and stroke and this is why.. The sugar scrapes down your large blood vessels and your body sees this as an internal injury and tries to repair itself. Just like you get a scab on your skin, your platelets will stick to the blood vessel wall trying to heal it. This is why we have diabetics on low dose aspirin daily, this prevents the platelets from sticking and can prevent plaque formation. In addition, your body takes cholesterol and tries to shove it into the open wound. This is why we want your LDL, or bad cholesterol, below 70.   The combination of platelets and cholesterol over 5-10 years forms plaque that can  break off and cause a heart attack or stroke.   PLEASE REMEMBER:  Diabetes is preventable! Up to 1 percent of complications and morbidities among individuals with type 2 diabetes can be prevented, delayed, or effectively treated and minimized with regular visits to a health professional, appropriate monitoring and medication, and a healthy diet and lifestyle.

## 2017-11-24 LAB — COMPLETE METABOLIC PANEL WITH GFR
AG RATIO: 1.4 (calc) (ref 1.0–2.5)
ALKALINE PHOSPHATASE (APISO): 79 U/L (ref 33–130)
ALT: 23 U/L (ref 6–29)
AST: 22 U/L (ref 10–35)
Albumin: 4 g/dL (ref 3.6–5.1)
BUN: 10 mg/dL (ref 7–25)
CHLORIDE: 101 mmol/L (ref 98–110)
CO2: 32 mmol/L (ref 20–32)
Calcium: 9.6 mg/dL (ref 8.6–10.4)
Creat: 0.84 mg/dL (ref 0.60–0.93)
GFR, EST NON AFRICAN AMERICAN: 69 mL/min/{1.73_m2} (ref >=60)
GFR, Est African American: 80 mL/min/{1.73_m2} (ref >=60)
Globulin: 2.9 g/dL (calc) (ref 1.9–3.7)
Glucose, Bld: 97 mg/dL (ref 65–99)
POTASSIUM: 4.1 mmol/L (ref 3.5–5.3)
Sodium: 140 mmol/L (ref 135–146)
Total Bilirubin: 0.5 mg/dL (ref 0.2–1.2)
Total Protein: 6.9 g/dL (ref 6.1–8.1)

## 2017-11-24 LAB — CBC WITH DIFFERENTIAL/PLATELET
Basophils Absolute: 57 cells/uL (ref 0–200)
Basophils Relative: 1.1 %
EOS PCT: 2.5 %
Eosinophils Absolute: 130 cells/uL (ref 15–500)
HCT: 41 % (ref 35.0–45.0)
Hemoglobin: 13.8 g/dL (ref 11.7–15.5)
Lymphs Abs: 1763 cells/uL (ref 850–3900)
MCH: 28.3 pg (ref 27.0–33.0)
MCHC: 33.7 g/dL (ref 32.0–36.0)
MCV: 84.2 fL (ref 80.0–100.0)
MPV: 10.6 fL (ref 7.5–12.5)
Monocytes Relative: 11.3 %
NEUTROS PCT: 51.2 %
Neutro Abs: 2662 cells/uL (ref 1500–7800)
PLATELETS: 267 10*3/uL (ref 140–400)
RBC: 4.87 10*6/uL (ref 3.80–5.10)
RDW: 13.9 % (ref 11.0–15.0)
TOTAL LYMPHOCYTE: 33.9 %
WBC mixed population: 588 cells/uL (ref 200–950)
WBC: 5.2 10*3/uL (ref 3.8–10.8)

## 2017-11-24 LAB — LIPID PANEL
CHOLESTEROL: 152 mg/dL (ref <200)
HDL: 45 mg/dL — ABNORMAL LOW (ref >50)
LDL Cholesterol (Calc): 80 mg/dL (calc)
Non-HDL Cholesterol (Calc): 107 mg/dL (calc) (ref <130)
Total CHOL/HDL Ratio: 3.4 (calc) (ref <5.0)
Triglycerides: 168 mg/dL — ABNORMAL HIGH (ref <150)

## 2017-11-24 LAB — TSH: TSH: 2.01 m[IU]/L (ref 0.40–4.50)

## 2017-11-24 LAB — HEMOGLOBIN A1C
Hgb A1c MFr Bld: 6.5 % of total Hgb — ABNORMAL HIGH (ref <5.7)
Mean Plasma Glucose: 140 (calc)
eAG (mmol/L): 7.7 (calc)

## 2017-12-17 ENCOUNTER — Other Ambulatory Visit: Payer: Self-pay | Admitting: Physician Assistant

## 2017-12-17 ENCOUNTER — Other Ambulatory Visit: Payer: Self-pay | Admitting: Internal Medicine

## 2018-01-07 ENCOUNTER — Other Ambulatory Visit: Payer: Self-pay | Admitting: Internal Medicine

## 2018-02-20 ENCOUNTER — Encounter: Payer: Self-pay | Admitting: Physician Assistant

## 2018-02-28 ENCOUNTER — Encounter: Payer: Self-pay | Admitting: Internal Medicine

## 2018-02-28 NOTE — Progress Notes (Signed)
[facility] Unk Pinto, M.D.     Uvaldo Bristle. Silverio Lay, P.A.-C Liane Comber, [place], [place] [address] Telephone [phone] Telefax [phone] Annual Screening/Preventative Visit & Comprehensive Evaluation &  Examination     This very nice 73 y.o. MBF presents for a Screening /Preventative Visit & comprehensive evaluation and management of multiple medical co-morbidities.  Patient has been followed for HTN, HLD, T2_NIDDM  and Vitamin D Deficiency.      HTN predates circa 1995. Patient's BP has been controlled at home and patient denies any cardiac symptoms as chest pain, palpitations, shortness of breath, dizziness or ankle swelling. Today's BP is at goal - 134/72.      Patient's hyperlipidemia is controlled with diet and medications. Patient denies myalgias or other medication SE's. Last lipids were at goal albeit elevated Trig's: Lab Results  Component Value Date   CHOL 144 03/01/2018   HDL 43 (L) 03/01/2018   LDLCALC 81 03/01/2018   TRIG 107 03/01/2018   CHOLHDL 3.3 03/01/2018      Patient has hx/o  prediabetes (A1c 6.0% / 2011)  and then T2_NIDDM (A1c 7.4% / 2016) and patient denies reactive hypoglycemic symptoms, visual blurring, diabetic polys or paresthesias. Last A1c was not at goal: Lab Results  Component Value Date   HGBA1C 6.0 (H) 03/01/2018      Finally, patient has history of Vitamin D Deficiency and last Vitamin D was sl elevated and dose was reduced: Lab Results  Component Value Date   VD25OH 100 03/01/2018   Current Outpatient Medications on File Prior to Visit  Medication Sig  . ACCU-CHEK AVIVA PLUS test strip USE STRIP TO CHECK BLOOD SUGAR ONCE DAILY  . acyclovir (ZOVIRAX) 800 MG tablet Take 1 tablet daily for Fever Blisters  . aspirin 81 MG chewable tablet Chew by mouth daily.  Marland Kitchen atorvastatin (LIPITOR) 80 MG tablet TAKE 1 TABLET BY MOUTH ONCE DAILY  .  Blood Glucose Monitoring Suppl (ACCU-CHEK AVIVA PLUS) W/DEVICE KIT Check blood sugar 1 time daily-DX-R73.09  . Cholecalciferol (VITAMIN D PO) Take 10,000 Int'l Units by mouth daily.   . enalapril (VASOTEC) 20 MG tablet TAKE 1 TABLET BY MOUTH ONCE DAILY  . estradiol (ESTRACE) 1 MG tablet TAKE 1 TABLET BY MOUTH ONCE DAILY  . hydrochlorothiazide (HYDRODIURIL) 25 MG tablet TAKE 1 TABLET BY MOUTH ONCE DAILY FOR BLOOD PRESSURE AND FOR FLUID  . hydrocortisone (ANUSOL-HC) 25 MG suppository Place 1 suppository (25 mg total) rectally 2 (two) times daily. For 7 days  . Lancets (ACCU-CHEK SOFT TOUCH) lancets Check blood sugar 1 time daily-DX-R73.09  . MAGNESIUM PO Take 500 mg by mouth daily.   . metFORMIN (GLUCOPHAGE XR) 500 MG 24 hr tablet Start taking 1 tablet PO Qdaily with largest meal.  If tolerating once a day dosing, then increase to 1 tablet PO BID with largest meals.  . montelukast (SINGULAIR) 10 MG tablet Take 1 tablet daily for Allergies  . Multiple Vitamins-Minerals (MULTIVITAMIN WITH MINERALS) tablet Take 1 tablet by mouth daily.  . Omega-3 Fatty Acids (FISH OIL PO) Take by mouth daily.  . solifenacin (VESICARE) 5 MG tablet Take 5 mg by mouth daily.  Marland Kitchen topiramate (TOPAMAX) 25 MG tablet Take 1 tablet (25 mg total) by mouth at bedtime.   No current facility-administered medications on file prior to visit.    Not on File Past Medical History:  Diagnosis Date  . Allergy   . GERD (gastroesophageal reflux  disease)   . Hyperlipidemia   . Hypertension   . Prediabetes   . Vitamin D deficiency    Health Maintenance  Topic Date Due  . OPHTHALMOLOGY EXAM  07/17/2017  . HEMOGLOBIN A1C  08/30/2018  . FOOT EXAM  03/01/2019  . MAMMOGRAM  11/14/2019  . COLONOSCOPY  02/02/2020  . TETANUS/TDAP  08/10/2022  . INFLUENZA VACCINE  Completed  . DEXA SCAN  Completed  . Hepatitis C Screening  Completed  . PNA vac Low Risk Adult  Completed   Immunization History  Administered Date(s) Administered  .  Influenza Split 10/31/2012  . Influenza, High Dose Seasonal PF 09/13/2016, 11/08/2017  . Influenza-Unspecified 09/26/2014, 10/06/2015  . Pneumococcal Conjugate-13 03/09/2014  . Pneumococcal Polysaccharide-23 08/06/2015  . Td 08/09/2012  . Zoster 08/14/2012   Last Colon - 02/01/2010 - Dr Deatra Ina - tubular adenomas - Recc 5 yr f/u Colon  Last MGM - 11/13/2017   Past Surgical History:  Procedure Laterality Date  . ABDOMINAL HYSTERECTOMY Bilateral 1977   bso  . KNEE SURGERY Left 09/2009   Family History  Problem Relation Age of Onset  . Heart disease Mother   . Ulcers Father   . Heart disease Brother   . Hypertension Brother   . Kidney disease Brother    Social History   Tobacco Use  . Smoking status: Former Smoker    Types: Cigarettes    Last attempt to quit: 01/16/1994    Years since quitting: 24.1  . Smokeless tobacco: Never Used  Substance Use Topics  . Alcohol use: Yes    Comment: ocassional  . Drug use: Not on file    ROS Constitutional: Denies fever, chills, weight loss/gain, headaches, insomnia,  night sweats, and change in appetite. Does c/o fatigue. Eyes: Denies redness, blurred vision, diplopia, discharge, itchy, watery eyes.  ENT: Denies discharge, congestion, post nasal drip, epistaxis, sore throat, earache, hearing loss, dental pain, Tinnitus, Vertigo, Sinus pain, snoring.  Cardio: Denies chest pain, palpitations, irregular heartbeat, syncope, dyspnea, diaphoresis, orthopnea, PND, claudication, edema Respiratory: denies cough, dyspnea, DOE, pleurisy, hoarseness, laryngitis, wheezing.  Gastrointestinal: Denies dysphagia, heartburn, reflux, water brash, pain, cramps, nausea, vomiting, bloating, diarrhea, constipation, hematemesis, melena, hematochezia, jaundice, hemorrhoids Genitourinary: Denies dysuria, frequency, urgency, nocturia, hesitancy, discharge, hematuria, flank pain Breast: Breast lumps, nipple discharge, bleeding.  Musculoskeletal: Denies arthralgia,  myalgia, stiffness, Jt. Swelling, pain, limp, and strain/sprain. Denies falls. Skin: Denies puritis, rash, hives, warts, acne, eczema, changing in skin lesion Neuro: No weakness, tremor, incoordination, spasms, paresthesia, pain Psychiatric: Denies confusion, memory loss, sensory loss. Denies Depression. Endocrine: Denies change in weight, skin, hair change, nocturia, and paresthesia, diabetic polys, visual blurring, hyper / hypo glycemic episodes.  Heme/Lymph: No excessive bleeding, bruising, enlarged lymph nodes.  Physical Exam  BP 134/72   Pulse 88   Temp 97.7 F (36.5 C)   Ht '5\' 5"'$  (1.651 m)   Wt 156 lb 3.2 oz (70.9 kg)   SpO2 99%   BMI 25.99 kg/m   General Appearance: Well nourished, well groomed and in no apparent distress.  Eyes: PERRLA, EOMs, conjunctiva no swelling or erythema, normal fundi and vessels. Sinuses: No frontal/maxillary tenderness ENT/Mouth: EACs patent / TMs  nl. Nares clear without erythema, swelling, mucoid exudates. Oral hygiene is good. No erythema, swelling, or exudate. Tongue normal, non-obstructing. Tonsils not swollen or erythematous. Hearing normal.  Neck: Supple, thyroid not palpable. No bruits, nodes or JVD. Respiratory: Respiratory effort normal.  BS equal and clear bilateral without rales, rhonci, wheezing or stridor.  Cardio: Heart sounds are normal with regular rate and rhythm and no murmurs, rubs or gallops. Peripheral pulses are normal and equal bilaterally without edema. No aortic or femoral bruits. Chest: symmetric with normal excursions and percussion. Breasts: Symmetric, without lumps, nipple discharge, retractions, or fibrocystic changes.  Abdomen: Flat, soft with bowel sounds active. Nontender, no guarding, rebound, hernias, masses, or organomegaly.  Lymphatics: Non tender without lymphadenopathy.  Genitourinary:  Musculoskeletal: Full ROM all peripheral extremities, joint stability, 5/5 strength, and normal gait. Skin: Warm and dry  without rashes, lesions, cyanosis, clubbing or  ecchymosis.  Neuro: Cranial nerves intact, reflexes equal bilaterally. Normal muscle tone, no cerebellar symptoms. Sensation intact.  Pysch: Alert and oriented X 3, normal affect, Insight and Judgment appropriate.   Assessment and Plan  1. Annual Preventative Screening Examination  2. Essential hypertension  - EKG 12-Lead - Urinalysis, Routine w reflex microscopic - Microalbumin / creatinine urine ratio - CBC with Differential/Platelet - COMPLETE METABOLIC PANEL WITH GFR - Magnesium - TSH  3. Hyperlipidemia, mixed  - EKG 12-Lead - Lipid panel - TSH  4. Type 2 diabetes mellitus with stage 1 chronic kidney disease, without long-term current use of insulin (HCC)  - EKG 12-Lead - HM DIABETES FOOT EXAM - LOW EXTREMITY NEUR EXAM DOCUM - Hemoglobin A1c - Insulin, random  5. Vitamin D deficiency  - VITAMIN D 25 Hydroxyl  6. Gastroesophageal reflux disease  - CBC with Differential/Platelet  7. Screening for colorectal cancer  - POC Hemoccult Bld/Stl   8. Screening for ischemic heart disease  - EKG 12-Lead  9. Former smoker  - EKG 12-Lead  10. FHx: heart disease  - EKG 12-Lead  11. Medication management  - Urinalysis, Routine w reflex microscopic - Microalbumin / creatinine urine ratio - CBC with Differential/Platelet - COMPLETE METABOLIC PANEL WITH GFR - Magnesium - Lipid panel - TSH - Hemoglobin A1c - Insulin, random - VITAMIN D 25 Hydroxyl         Patient was counseled in prudent diet to achieve/maintain BMI less than 25 for weight control, BP monitoring, regular exercise and medications. Discussed med's effects and SE's. Screening labs and tests as requested with regular follow-up as recommended. Over 40 minutes of exam, counseling, chart review and high complex critical decision making was performed.

## 2018-02-28 NOTE — Patient Instructions (Signed)

## 2018-03-01 ENCOUNTER — Ambulatory Visit (INDEPENDENT_AMBULATORY_CARE_PROVIDER_SITE_OTHER): Payer: Medicare Other | Admitting: Internal Medicine

## 2018-03-01 ENCOUNTER — Encounter: Payer: Self-pay | Admitting: Internal Medicine

## 2018-03-01 VITALS — BP 134/72 | HR 88 | Temp 97.7°F | Ht 65.0 in | Wt 156.2 lb

## 2018-03-01 DIAGNOSIS — Z1211 Encounter for screening for malignant neoplasm of colon: Secondary | ICD-10-CM

## 2018-03-01 DIAGNOSIS — Z0000 Encounter for general adult medical examination without abnormal findings: Secondary | ICD-10-CM | POA: Diagnosis not present

## 2018-03-01 DIAGNOSIS — Z87891 Personal history of nicotine dependence: Secondary | ICD-10-CM

## 2018-03-01 DIAGNOSIS — E782 Mixed hyperlipidemia: Secondary | ICD-10-CM

## 2018-03-01 DIAGNOSIS — K219 Gastro-esophageal reflux disease without esophagitis: Secondary | ICD-10-CM

## 2018-03-01 DIAGNOSIS — I10 Essential (primary) hypertension: Secondary | ICD-10-CM | POA: Diagnosis not present

## 2018-03-01 DIAGNOSIS — Z8249 Family history of ischemic heart disease and other diseases of the circulatory system: Secondary | ICD-10-CM

## 2018-03-01 DIAGNOSIS — Z136 Encounter for screening for cardiovascular disorders: Secondary | ICD-10-CM

## 2018-03-01 DIAGNOSIS — Z0001 Encounter for general adult medical examination with abnormal findings: Secondary | ICD-10-CM

## 2018-03-01 DIAGNOSIS — E559 Vitamin D deficiency, unspecified: Secondary | ICD-10-CM

## 2018-03-01 DIAGNOSIS — E1122 Type 2 diabetes mellitus with diabetic chronic kidney disease: Secondary | ICD-10-CM

## 2018-03-01 DIAGNOSIS — N181 Chronic kidney disease, stage 1: Secondary | ICD-10-CM

## 2018-03-01 DIAGNOSIS — Z1212 Encounter for screening for malignant neoplasm of rectum: Secondary | ICD-10-CM

## 2018-03-01 DIAGNOSIS — Z79899 Other long term (current) drug therapy: Secondary | ICD-10-CM

## 2018-03-02 ENCOUNTER — Encounter: Payer: Self-pay | Admitting: Internal Medicine

## 2018-03-02 LAB — URINALYSIS, ROUTINE W REFLEX MICROSCOPIC
Bilirubin Urine: NEGATIVE
GLUCOSE, UA: NEGATIVE
Hgb urine dipstick: NEGATIVE
Ketones, ur: NEGATIVE
LEUKOCYTE UA: NEGATIVE
Nitrite: NEGATIVE
PROTEIN: NEGATIVE
Specific Gravity, Urine: 1.009 (ref 1.001–1.03)
pH: 7 (ref 5.0–8.0)

## 2018-03-02 LAB — MICROALBUMIN / CREATININE URINE RATIO
Creatinine, Urine: 45 mg/dL (ref 20–275)
Microalb Creat Ratio: 18 mcg/mg creat (ref <30)
Microalb, Ur: 0.8 mg/dL

## 2018-03-04 LAB — MAGNESIUM: Magnesium: 1.7 mg/dL (ref 1.5–2.5)

## 2018-03-04 LAB — COMPLETE METABOLIC PANEL WITH GFR
AG RATIO: 1.5 (calc) (ref 1.0–2.5)
ALT: 22 U/L (ref 6–29)
AST: 28 U/L (ref 10–35)
Albumin: 4.2 g/dL (ref 3.6–5.1)
Alkaline phosphatase (APISO): 70 U/L (ref 37–153)
BILIRUBIN TOTAL: 0.6 mg/dL (ref 0.2–1.2)
BUN: 11 mg/dL (ref 7–25)
CHLORIDE: 101 mmol/L (ref 98–110)
CO2: 30 mmol/L (ref 20–32)
Calcium: 9.9 mg/dL (ref 8.6–10.4)
Creat: 0.84 mg/dL (ref 0.60–0.93)
GFR, Est African American: 80 mL/min/{1.73_m2} (ref >=60)
GFR, Est Non African American: 69 mL/min/{1.73_m2} (ref >=60)
Globulin: 2.8 g/dL (calc) (ref 1.9–3.7)
Glucose, Bld: 89 mg/dL (ref 65–99)
Potassium: 3.7 mmol/L (ref 3.5–5.3)
Sodium: 140 mmol/L (ref 135–146)
Total Protein: 7 g/dL (ref 6.1–8.1)

## 2018-03-04 LAB — TSH: TSH: 2.68 mIU/L (ref 0.40–4.50)

## 2018-03-04 LAB — CBC WITH DIFFERENTIAL/PLATELET
Absolute Monocytes: 426 cells/uL (ref 200–950)
Basophils Absolute: 42 cells/uL (ref 0–200)
Basophils Relative: 1.1 %
Eosinophils Absolute: 49 cells/uL (ref 15–500)
Eosinophils Relative: 1.3 %
HCT: 38.8 % (ref 35.0–45.0)
Hemoglobin: 13.2 g/dL (ref 11.7–15.5)
LYMPHS ABS: 1364 {cells}/uL (ref 850–3900)
MCH: 28.4 pg (ref 27.0–33.0)
MCHC: 34 g/dL (ref 32.0–36.0)
MCV: 83.4 fL (ref 80.0–100.0)
MPV: 10.9 fL (ref 7.5–12.5)
Monocytes Relative: 11.2 %
NEUTROS ABS: 1919 {cells}/uL (ref 1500–7800)
Neutrophils Relative %: 50.5 %
Platelets: 267 10*3/uL (ref 140–400)
RBC: 4.65 10*6/uL (ref 3.80–5.10)
RDW: 13.8 % (ref 11.0–15.0)
Total Lymphocyte: 35.9 %
WBC: 3.8 10*3/uL (ref 3.8–10.8)

## 2018-03-04 LAB — LIPID PANEL
Cholesterol: 144 mg/dL (ref <200)
HDL: 43 mg/dL — ABNORMAL LOW (ref >=50)
LDL Cholesterol (Calc): 81 mg/dL (calc)
Non-HDL Cholesterol (Calc): 101 mg/dL (calc) (ref <130)
Total CHOL/HDL Ratio: 3.3 (calc) (ref <5.0)
Triglycerides: 107 mg/dL (ref <150)

## 2018-03-04 LAB — HEMOGLOBIN A1C
Hgb A1c MFr Bld: 6 % of total Hgb — ABNORMAL HIGH (ref <5.7)
Mean Plasma Glucose: 126 (calc)
eAG (mmol/L): 7 (calc)

## 2018-03-04 LAB — INSULIN, RANDOM: Insulin: 2.5 u[IU]/mL

## 2018-03-04 LAB — VITAMIN D 25 HYDROXY (VIT D DEFICIENCY, FRACTURES): Vit D, 25-Hydroxy: 100 ng/mL (ref 30–100)

## 2018-03-19 ENCOUNTER — Other Ambulatory Visit: Payer: Self-pay | Admitting: *Deleted

## 2018-03-19 DIAGNOSIS — Z1211 Encounter for screening for malignant neoplasm of colon: Secondary | ICD-10-CM

## 2018-03-19 DIAGNOSIS — Z1212 Encounter for screening for malignant neoplasm of rectum: Principal | ICD-10-CM

## 2018-03-19 LAB — POC HEMOCCULT BLD/STL (HOME/3-CARD/SCREEN)
Card #2 Fecal Occult Blod, POC: NEGATIVE
Card #3 Fecal Occult Blood, POC: NEGATIVE
Fecal Occult Blood, POC: NEGATIVE

## 2018-04-08 ENCOUNTER — Other Ambulatory Visit: Payer: Self-pay | Admitting: Adult Health Nurse Practitioner

## 2018-04-09 ENCOUNTER — Other Ambulatory Visit: Payer: Self-pay | Admitting: Adult Health Nurse Practitioner

## 2018-05-21 ENCOUNTER — Other Ambulatory Visit: Payer: Self-pay | Admitting: *Deleted

## 2018-05-21 MED ORDER — GLUCOSE BLOOD VI STRP: ORAL_STRIP | 5 refills | Status: DC

## 2018-06-11 ENCOUNTER — Other Ambulatory Visit: Payer: Self-pay | Admitting: Internal Medicine

## 2018-06-13 NOTE — Progress Notes (Signed)
MEDICARE ANNUAL WELLNESS VISIT AND FOLLOW UP  Assessment:   Diagnoses and all orders for this visit:  Encounter for Medicare annual wellness exam  Essential hypertension Continue medication Monitor blood pressure at home; call if consistently over 130/80 Continue DASH diet.   Reminder to go to the ER if any CP, SOB, nausea, dizziness, severe HA, changes vision/speech, left arm numbness and tingling and jaw pain.  Gastroesophageal reflux disease, esophagitis presence not specified Well managed on current medications Discussed diet, avoiding triggers and other lifestyle changes  OAB (overactive bladder) Symptoms improved with vesicare, uses one liner per day  Nephrolithiasis Increase fluids; followed by Dr. Matilde Sprang  Vitamin D deficiency Near goal at recent check; continue to recommend supplementation for goal of 70-100 Defer vitamin D level  T2DM Education: Reviewed 'ABCs' of diabetes management (respective goals in parentheses):  A1C (<7), blood pressure (<130/80), and cholesterol (LDL <70) Eye Exam yearly and Dental Exam every 6 months. UTD - requested reports Dietary recommendations Physical Activity recommendations Check feet daily; no concerns; foot exam UTD   Medication management CBC, CMP/GFR, magnesium   Mixed hyperlipidemia Continue medications: atorvastatin Discussed LDL goal <70 Continue low cholesterol diet and exercise.  Check lipid panel.   Overweight (BMI 25.0-29.9) Long discussion about weight loss, diet, and exercise Recommended diet heavy in fruits and veggies and low in animal meats, cheeses, and dairy products, appropriate calorie intake Discussed appropriate weight for height and initial goal (163 lb) Follow up at next visit  Recurrent cold sores Acyclovir PRN  Acute serous otitis media -Allergy pill, flonase, autoinflation, explained no need for ABX at this time.  Can complete prednisone - if not better 2-4 weeks follow up    Over  40 minutes of exam, counseling, chart review and critical decision making was performed Future Appointments  Date Time Provider [place]  09/17/2018  9:30 AM Unk Pinto, MD [facility] None  03/26/2019  9:00 AM Unk Pinto, MD [facility] None     Plan:   During the course of the visit the patient was educated and counseled about appropriate screening and preventive services including:    Pneumococcal vaccine   Prevnar 13  Influenza vaccine  Td vaccine  Screening electrocardiogram  Bone densitometry screening  Colorectal cancer screening  Diabetes screening  Glaucoma screening  Nutrition counseling   Advanced directives: requested   Subjective:  Kaitlyn Townsend is a 73 y.o. female who presents for Medicare Annual Wellness Visit and 3 month follow up.   Today she reports sensation of fullness and "echoing" in her R ear for 2 days; she has been taking some leftover prednisone. She is also on singulair daily. Denies nasal congestion, sore throat, sinus tenderness (does endorse mild sinus pressure), headache, fever/chills.   She is followed by Dr. Matilde Sprang at [facility] urology for recurrent nephrolithiasis. Vesicare for OAB is working well for her.  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   She was on topamax for residual pain from previous fracture of left femur 2011 and was doing well with this but tapered off recently and doing well.   She is on estrace 1 mg daily following hysterectomy and doing well with this.   BMI is Body mass index is 24.46 kg/m., she has been working on diet and exercise. She is on stationary bike 15-20 miles 5 days a week. Weight is down from 168-170 lb.  Wt Readings from Last 3 Encounters:  06/17/18 147 lb (66.7 kg)  03/01/18 156 lb 3.2 oz (  70.9 kg)  11/23/17 167 lb (75.8 kg)    Her blood pressure has been controlled at home, today their BP is BP: 126/72 She does workout. She denies chest pain, shortness of breath,  dizziness.   She is on cholesterol medication (atorvastatin 40 mg daily) and denies myalgias. Her LDL cholesterol is not at goal of LDL <70 at last check. The cholesterol last visit was:   Lab Results  Component Value Date   CHOL 144 03/01/2018   HDL 43 (L) 03/01/2018   LDLCALC 81 03/01/2018   TRIG 107 03/01/2018   CHOLHDL 3.3 03/01/2018    She has been working on diet and exercise for T2DM on metformin 500 mg twice daily and denies foot ulcerations, increased appetite, nausea, paresthesia of the feet, polydipsia, polyuria, visual disturbances, vomiting and weight loss. She does check fasting sugars, range (84-108). Last A1C in the office was:  Lab Results  Component Value Date   HGBA1C 6.0 (H) 03/01/2018   Last GFR: Lab Results  Component Value Date   GFRNONAA 69 03/01/2018   Patient is on Vitamin D supplement and approaching goal at recent check:    Lab Results  Component Value Date   VD25OH 100 03/01/2018      Medication Review: Current Outpatient Medications on File Prior to Visit  Medication Sig Dispense Refill  . aspirin 81 MG chewable tablet Chew by mouth daily.    Marland Kitchen atorvastatin (LIPITOR) 80 MG tablet TAKE 1 TABLET BY MOUTH ONCE DAILY 90 tablet 1  . Blood Glucose Monitoring Suppl (ACCU-CHEK AVIVA PLUS) W/DEVICE KIT Check blood sugar 1 time daily-DX-R73.09 1 kit 0  . Cholecalciferol (VITAMIN D PO) Take 5,000 Int'l Units by mouth daily. Take 10,000 units M,W,F and 5000 units all other days    . enalapril (VASOTEC) 20 MG tablet Take 1 tablet by mouth once daily 90 tablet 0  . estradiol (ESTRACE) 1 MG tablet Take 1 tablet by mouth once daily 90 tablet 0  . glucose blood (ACCU-CHEK AVIVA PLUS) test strip USE STRIP TO CHECK BLOOD SUGAR ONCE DAILY 100 each 5  . hydrochlorothiazide (HYDRODIURIL) 25 MG tablet Take 1 tablet Daily for BP & Fluid 90 tablet 3  . hydrocortisone (ANUSOL-HC) 25 MG suppository Place 1 suppository (25 mg total) rectally 2 (two) times daily. For 7 days 24  suppository 3  . Lancets (ACCU-CHEK SOFT TOUCH) lancets Check blood sugar 1 time daily-DX-R73.09 100 each 12  . MAGNESIUM PO Take 500 mg by mouth daily.     . metFORMIN (GLUCOPHAGE XR) 500 MG 24 hr tablet Start taking 1 tablet PO Qdaily with largest meal.  If tolerating once a day dosing, then increase to 1 tablet PO BID with largest meals. (Patient taking differently: Take one tablet one in the morning and one tablet in the evening) 180 tablet 3  . montelukast (SINGULAIR) 10 MG tablet Take 1 tablet daily for Allergies 90 tablet 3  . Multiple Vitamins-Minerals (MULTIVITAMIN WITH MINERALS) tablet Take 1 tablet by mouth daily.    . Omega-3 Fatty Acids (FISH OIL PO) Take by mouth daily.    . solifenacin (VESICARE) 5 MG tablet Take 5 mg by mouth daily.     No current facility-administered medications on file prior to visit.     No Known Allergies  Current Problems (verified) Patient Active Problem List   Diagnosis Date Noted  . Diabetes mellitus with chronic kidney disease ([place]) 11/22/2017  . Former smoker 08/21/2017  . FHx: heart disease 08/21/2017  .  Recurrent cold sores 05/14/2017  . Nephrolithiasis 02/16/2016  . OAB (overactive bladder) 02/16/2016  . Medication management 02/13/2013  . Hyperlipidemia associated with type 2 diabetes mellitus ([place])   . Hypertension   . Type 2 diabetes mellitus with hyperlipidemia ([place])   . Vitamin D deficiency   . GERD (gastroesophageal reflux disease)     Screening Tests Immunization History  Administered Date(s) Administered  . Influenza Split 10/31/2012  . Influenza, High Dose Seasonal PF 09/13/2016, 11/08/2017  . Influenza-Unspecified 09/26/2014, 10/06/2015  . Pneumococcal Conjugate-13 03/09/2014  . Pneumococcal Polysaccharide-23 08/06/2015  . Td 08/09/2012  . Zoster 08/14/2012   Preventative care: Last colonoscopy: 2012 Mammogram:10/2017 Last pap smear/pelvic exam: remote; s/p total hysterectomy DEXA: 2019, normal   Prior  vaccinations: TD or Tdap: 2014  Influenza: 2019 Pneumococcal: 2017 Prevnar13: 2016 Shingles/Zostavax: 2014  Names of Other Physician/Practitioners you currently use: 1. [facility] here for primary care 2. [facility], eye doctor, last visit 2020 3. Dr. Deatra Ina, dentist, last visit 2019  Patient Care Team: Unk Pinto, MD as PCP - General (Internal Medicine) Inda Castle, MD (Inactive) as Consulting Physician (Gastroenterology) Bjorn Loser, MD as Consulting Physician (Urology)  SURGICAL HISTORY She  has a past surgical history that includes Knee surgery (Left, 09/2009) and Abdominal hysterectomy (Bilateral, 1977). FAMILY HISTORY Her family history includes Heart disease in her brother and mother; Hypertension in her brother; Kidney disease in her brother; Ulcers in her father. SOCIAL HISTORY She  reports that she quit smoking about 24 years ago. Her smoking use included cigarettes. She has never used smokeless tobacco. She reports current alcohol use.   MEDICARE WELLNESS OBJECTIVES: Physical activity: Current Exercise Habits: Home exercise routine, Type of exercise: Other - see comments(stationary bike), Time (Minutes): 60, Frequency (Times/Week): 5, Weekly Exercise (Minutes/Week): 300, Intensity: Mild, Exercise limited by: None identified Cardiac risk factors: Cardiac Risk Factors include: advanced age (>39mn, >>52women);dyslipidemia;hypertension;diabetes mellitus;smoking/ tobacco exposure Depression/mood screen:   Depression screen [facility] 06/17/2018  Decreased Interest 0  Down, Depressed, Hopeless 0  PHQ - 2 Score 0    ADLs:  In your present state of health, do you have any difficulty performing the following activities: 06/17/2018 02/28/2018  Hearing? N N  Vision? N N  Difficulty concentrating or making decisions? N N  Walking or climbing stairs? N N  Dressing or bathing? N N  Doing errands, shopping? N N  Some recent  data might be hidden     Cognitive Testing  Alert? Yes  Normal Appearance?Yes  Oriented to person? Yes  Place? Yes   Time? Yes  Recall of three objects?  Yes  Can perform simple calculations? Yes  Displays appropriate judgment?Yes  Can read the correct time from a watch face?Yes  EOL planning: Does Patient Have a Medical Advance Directive?: Yes Type of Advance Directive: Healthcare Power of Attorney, Living will Does patient want to make changes to medical advance directive?: No - Patient declined [place] of [place]?: Yes - validated most recent copy scanned in chart (See row information)  Review of Systems  Constitutional: Negative for malaise/fatigue and weight loss.  HENT: Negative for hearing loss and tinnitus.   Eyes: Negative for blurred vision and double vision.  Respiratory: Negative for cough, sputum production, shortness of breath and wheezing.   Cardiovascular: Negative for chest pain, palpitations, orthopnea, claudication, leg swelling and PND.  Gastrointestinal: Negative for abdominal pain, blood in stool, constipation, diarrhea, heartburn, melena, nausea  and vomiting.  Genitourinary: Negative.   Musculoskeletal: Negative for falls, joint pain and myalgias.  Skin: Negative for rash.  Neurological: Negative for dizziness, tingling, sensory change, weakness and headaches.  Endo/Heme/Allergies: Negative for polydipsia.  Psychiatric/Behavioral: Negative.  Negative for depression, memory loss, substance abuse and suicidal ideas. The patient is not nervous/anxious and does not have insomnia.   All other systems reviewed and are negative.    Objective:     Today's Vitals   06/17/18 0854  BP: 126/72  Pulse: 74  Temp: (!) 97.5 F (36.4 C)  SpO2: 99%  Weight: 147 lb (66.7 kg)  Height: '5\' 5"'$  (1.651 m)   Body mass index is 24.46 kg/m.  General appearance: alert, no distress, WD/WN, female HEENT: normocephalic, sclerae anicteric, TMs  pearly, nares patent, no discharge or erythema, pharynx normal Oral cavity: MMM, no lesions Neck: supple, no lymphadenopathy, no thyromegaly, no masses Heart: RRR, normal S1, S2, no murmurs Lungs: CTA bilaterally, no wheezes, rhonchi, or rales Abdomen: +bs, soft, non tender, non distended, no masses, no hepatomegaly, no splenomegaly Musculoskeletal: nontender, no swelling, no obvious deformity Extremities: no edema, no cyanosis, no clubbing Pulses: 2+ symmetric, upper and lower extremities, normal cap refill Neurological: alert, oriented x 3, CN2-12 intact, strength normal upper extremities and lower extremities, sensation normal throughout, DTRs 2+ throughout, no cerebellar signs, gait normal Psychiatric: normal affect, behavior normal, pleasant   Medicare Attestation I have personally reviewed: The patient's medical and social history Their use of alcohol, tobacco or illicit drugs Their current medications and supplements The patient's functional ability including ADLs,fall risks, home safety risks, cognitive, and hearing and visual impairment Diet and physical activities Evidence for depression or mood disorders  The patient's weight, height, BMI, and visual acuity have been recorded in the chart.  I have made referrals, counseling, and provided education to the patient based on review of the above and I have provided the patient with a written personalized care plan for preventive services.     Izora Ribas, NP   06/17/2018

## 2018-06-17 ENCOUNTER — Ambulatory Visit (INDEPENDENT_AMBULATORY_CARE_PROVIDER_SITE_OTHER): Payer: Medicare Other | Admitting: Adult Health

## 2018-06-17 ENCOUNTER — Encounter: Payer: Self-pay | Admitting: Adult Health

## 2018-06-17 ENCOUNTER — Other Ambulatory Visit: Payer: Self-pay

## 2018-06-17 VITALS — BP 126/72 | HR 74 | Temp 97.5°F | Ht 65.0 in | Wt 147.0 lb

## 2018-06-17 DIAGNOSIS — E1122 Type 2 diabetes mellitus with diabetic chronic kidney disease: Secondary | ICD-10-CM

## 2018-06-17 DIAGNOSIS — Z79899 Other long term (current) drug therapy: Secondary | ICD-10-CM

## 2018-06-17 DIAGNOSIS — N181 Chronic kidney disease, stage 1: Secondary | ICD-10-CM

## 2018-06-17 DIAGNOSIS — B001 Herpesviral vesicular dermatitis: Secondary | ICD-10-CM | POA: Diagnosis not present

## 2018-06-17 DIAGNOSIS — Z87891 Personal history of nicotine dependence: Secondary | ICD-10-CM

## 2018-06-17 DIAGNOSIS — Z0000 Encounter for general adult medical examination without abnormal findings: Secondary | ICD-10-CM

## 2018-06-17 DIAGNOSIS — E1169 Type 2 diabetes mellitus with other specified complication: Secondary | ICD-10-CM

## 2018-06-17 DIAGNOSIS — E559 Vitamin D deficiency, unspecified: Secondary | ICD-10-CM

## 2018-06-17 DIAGNOSIS — R6889 Other general symptoms and signs: Secondary | ICD-10-CM | POA: Diagnosis not present

## 2018-06-17 DIAGNOSIS — Z6824 Body mass index (BMI) 24.0-24.9, adult: Secondary | ICD-10-CM

## 2018-06-17 DIAGNOSIS — K219 Gastro-esophageal reflux disease without esophagitis: Secondary | ICD-10-CM | POA: Diagnosis not present

## 2018-06-17 DIAGNOSIS — Z0001 Encounter for general adult medical examination with abnormal findings: Secondary | ICD-10-CM

## 2018-06-17 DIAGNOSIS — N200 Calculus of kidney: Secondary | ICD-10-CM

## 2018-06-17 DIAGNOSIS — E785 Hyperlipidemia, unspecified: Secondary | ICD-10-CM

## 2018-06-17 DIAGNOSIS — N3281 Overactive bladder: Secondary | ICD-10-CM

## 2018-06-17 DIAGNOSIS — I10 Essential (primary) hypertension: Secondary | ICD-10-CM

## 2018-06-17 NOTE — Patient Instructions (Addendum)
  Kaitlyn Townsend , Thank you for taking time to come for your Medicare Wellness Visit. I appreciate your ongoing commitment to your health goals. Please review the following plan we discussed and let me know if I can assist you in the future.   These are the goals we discussed: Goals    . HEMOGLOBIN A1C < 5.7    . LDL CALC < 70       This is a list of the screening recommended for you and due dates:  Health Maintenance  Topic Date Due  . Eye exam for diabetics  07/17/2017  . Flu Shot  08/17/2018  . Hemoglobin A1C  08/30/2018  . Complete foot exam   03/01/2019  . Mammogram  11/14/2019  . Colon Cancer Screening  02/02/2020  . Tetanus Vaccine  08/10/2022  . DEXA scan (bone density measurement)  Completed  .  Hepatitis C: One time screening is recommended by Center for Disease Control  (CDC) for  adults born from [birth year] through 1966.   Completed  . Pneumonia vaccines  Completed

## 2018-06-18 LAB — CBC WITH DIFFERENTIAL/PLATELET
Absolute Monocytes: 852 cells/uL (ref 200–950)
Basophils Absolute: 24 cells/uL (ref 0–200)
Basophils Relative: 0.2 %
Eosinophils Absolute: 0 cells/uL — ABNORMAL LOW (ref 15–500)
Eosinophils Relative: 0 %
HCT: 40.2 % (ref 35.0–45.0)
Hemoglobin: 12.9 g/dL (ref 11.7–15.5)
Lymphs Abs: 1572 cells/uL (ref 850–3900)
MCH: 27.4 pg (ref 27.0–33.0)
MCHC: 32.1 g/dL (ref 32.0–36.0)
MCV: 85.5 fL (ref 80.0–100.0)
MPV: 10.8 fL (ref 7.5–12.5)
Monocytes Relative: 7.1 %
Neutro Abs: 9552 cells/uL — ABNORMAL HIGH (ref 1500–7800)
Neutrophils Relative %: 79.6 %
Platelets: 300 10*3/uL (ref 140–400)
RBC: 4.7 10*6/uL (ref 3.80–5.10)
RDW: 13.7 % (ref 11.0–15.0)
Total Lymphocyte: 13.1 %
WBC: 12 10*3/uL — ABNORMAL HIGH (ref 3.8–10.8)

## 2018-06-18 LAB — MAGNESIUM: Magnesium: 1.8 mg/dL (ref 1.5–2.5)

## 2018-06-18 LAB — COMPLETE METABOLIC PANEL WITH GFR
AG Ratio: 1.4 (calc) (ref 1.0–2.5)
ALT: 18 U/L (ref 6–29)
AST: 20 U/L (ref 10–35)
Albumin: 4.1 g/dL (ref 3.6–5.1)
Alkaline phosphatase (APISO): 61 U/L (ref 37–153)
BUN: 17 mg/dL (ref 7–25)
CO2: 32 mmol/L (ref 20–32)
Calcium: 10.2 mg/dL (ref 8.6–10.4)
Chloride: 103 mmol/L (ref 98–110)
Creat: 0.88 mg/dL (ref 0.60–0.93)
GFR, Est African American: 76 mL/min/{1.73_m2} (ref >=60)
GFR, Est Non African American: 65 mL/min/{1.73_m2} (ref >=60)
Globulin: 2.9 g/dL (calc) (ref 1.9–3.7)
Glucose, Bld: 97 mg/dL (ref 65–99)
Potassium: 3.9 mmol/L (ref 3.5–5.3)
Sodium: 142 mmol/L (ref 135–146)
Total Bilirubin: 0.4 mg/dL (ref 0.2–1.2)
Total Protein: 7 g/dL (ref 6.1–8.1)

## 2018-06-18 LAB — LIPID PANEL
Cholesterol: 158 mg/dL (ref <200)
HDL: 54 mg/dL (ref >=50)
LDL Cholesterol (Calc): 85 mg/dL (calc)
Non-HDL Cholesterol (Calc): 104 mg/dL (calc) (ref <130)
Total CHOL/HDL Ratio: 2.9 (calc) (ref <5.0)
Triglycerides: 96 mg/dL (ref <150)

## 2018-06-18 LAB — HEMOGLOBIN A1C
Hgb A1c MFr Bld: 5.8 % of total Hgb — ABNORMAL HIGH (ref <5.7)
Mean Plasma Glucose: 120 (calc)
eAG (mmol/L): 6.6 (calc)

## 2018-06-18 LAB — TSH: TSH: 0.37 mIU/L — ABNORMAL LOW (ref 0.40–4.50)

## 2018-06-23 ENCOUNTER — Other Ambulatory Visit: Payer: Self-pay | Admitting: Internal Medicine

## 2018-06-23 ENCOUNTER — Other Ambulatory Visit: Payer: Self-pay | Admitting: Physician Assistant

## 2018-06-23 DIAGNOSIS — E1122 Type 2 diabetes mellitus with diabetic chronic kidney disease: Secondary | ICD-10-CM

## 2018-06-23 MED ORDER — METFORMIN HCL ER 500 MG PO TB24: ORAL_TABLET | ORAL | 3 refills | Status: DC

## 2018-06-25 ENCOUNTER — Other Ambulatory Visit: Payer: Self-pay | Admitting: Internal Medicine

## 2018-06-25 DIAGNOSIS — E1122 Type 2 diabetes mellitus with diabetic chronic kidney disease: Secondary | ICD-10-CM

## 2018-06-25 MED ORDER — METFORMIN HCL 500 MG PO TABS: ORAL_TABLET | ORAL | 3 refills | Status: DC

## 2018-07-09 ENCOUNTER — Other Ambulatory Visit: Payer: Self-pay | Admitting: Adult Health Nurse Practitioner

## 2018-07-15 ENCOUNTER — Other Ambulatory Visit: Payer: Self-pay | Admitting: Adult Health

## 2018-08-20 ENCOUNTER — Other Ambulatory Visit: Payer: Self-pay | Admitting: Internal Medicine

## 2018-08-20 DIAGNOSIS — J301 Allergic rhinitis due to pollen: Secondary | ICD-10-CM

## 2018-09-16 NOTE — Progress Notes (Signed)
History of Present Illness:      This very nice 73 y.o. MBF presents for 6 month follow up with HTN, HLD, T2_NIDDM and Vitamin D Deficiency.       Patient is treated for HTN (1995)  & BP has been controlled at home. Today's BP is at goal - 126/68. Patient has had no complaints of any cardiac type chest pain, palpitations, dyspnea / orthopnea / PND, dizziness, claudication, or dependent edema.      Hyperlipidemia is controlled with diet & Atorvastatin. Patient denies myalgias or other med SE's. 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 She has had no symptoms of reactive hypoglycemia, diabetic polys, paresthesias or visual blurring.  Last A1c was near goal: Lab Results  Component Value Date   HGBA1C 5.8 (H) 06/17/2018       Further, the patient also has history of Vitamin D Deficiency and supplements vitamin D without any suspected side-effects. Last vitamin D was at goal: Lab Results  Component Value Date   VD25OH 100 03/01/2018   Current Outpatient Medications on File Prior to Visit  Medication Sig  . aspirin 81 MG chewable tablet Chew by mouth daily.  Marland Kitchen atorvastatin (LIPITOR) 80 MG tablet Take 1 tablet Daily for Cholesterol  . Blood Glucose Monitoring Suppl (ACCU-CHEK AVIVA PLUS) W/DEVICE KIT Check blood sugar 1 time daily-DX-R73.09  . Cholecalciferol (VITAMIN D PO) Take 5,000 Int'l Units by mouth daily. Take 10,000 units M,W,F and 5000 units all other days  . enalapril (VASOTEC) 20 MG tablet Take 1 tablet by mouth once daily  . estradiol (ESTRACE) 1 MG tablet Take 1 tablet Daily  . glucose blood (ACCU-CHEK AVIVA PLUS) test strip USE STRIP TO CHECK BLOOD SUGAR ONCE DAILY  . hydrochlorothiazide (HYDRODIURIL)  25 MG tablet Take 1 tablet Daily for BP & Fluid  . hydrocortisone (ANUSOL-HC) 25 MG suppository Place 1 suppository (25 mg total) rectally 2 (two) times daily. For 7 days  . Lancets (ACCU-CHEK SOFT TOUCH) lancets Check blood sugar 1 time daily-DX-R73.09  . MAGNESIUM PO Take 500 mg by mouth daily.   . metFORMIN (GLUCOPHAGE) 500 MG tablet Take 1 tablet 2 x / day with meals for Diabetes  . montelukast (SINGULAIR) 10 MG tablet TAKE 1 TABLET BY MOUTH ONCE DAILY FOR ALLERGIES  . Multiple Vitamins-Minerals (MULTIVITAMIN WITH MINERALS) tablet Take 1 tablet by mouth daily.  . Omega-3 Fatty Acids (FISH OIL PO) Take by mouth daily.   No current facility-administered medications on file prior to visit.    No Known Allergies  PMHx:   Past Medical History:  Diagnosis Date  . Allergy   . GERD (gastroesophageal reflux disease)   . Hyperlipidemia   . Hypertension   . Prediabetes   . Vitamin D deficiency    Immunization History  Administered Date(s) Administered  . Influenza Split 10/31/2012  . Influenza, High Dose Seasonal PF 09/13/2016, 11/08/2017  . Influenza-Unspecified 09/26/2014, 10/06/2015, 09/04/2018  . Pneumococcal Conjugate-13 03/09/2014  . Pneumococcal Polysaccharide-23 08/06/2015  . Td 08/09/2012  . Zoster 08/14/2012   Past Surgical History:  Procedure Laterality Date  . ABDOMINAL HYSTERECTOMY Bilateral 1977  bso  . KNEE SURGERY Left 09/2009   FHx:    Reviewed / unchanged  SHx:    Reviewed / unchanged   Systems Review:  Constitutional: Denies fever, chills, wt changes, headaches, insomnia, fatigue, night sweats, change in appetite. Eyes: Denies redness, blurred vision, diplopia, discharge, itchy, watery eyes.  ENT: Denies discharge, congestion, post nasal drip, epistaxis, sore throat, earache, hearing loss, dental pain, tinnitus, vertigo, sinus pain, snoring.  CV: Denies chest pain, palpitations, irregular heartbeat, syncope, dyspnea, diaphoresis, orthopnea, PND,  claudication or edema. Respiratory: denies cough, dyspnea, DOE, pleurisy, hoarseness, laryngitis, wheezing.  Gastrointestinal: Denies dysphagia, odynophagia, heartburn, reflux, water brash, abdominal pain or cramps, nausea, vomiting, bloating, diarrhea, constipation, hematemesis, melena, hematochezia  or hemorrhoids. Genitourinary: Denies dysuria, frequency, urgency, nocturia, hesitancy, discharge, hematuria or flank pain. Musculoskeletal: Denies arthralgias, myalgias, stiffness, jt. swelling, pain, limping or strain/sprain.  Skin: Denies pruritus, rash, hives, warts, acne, eczema or change in skin lesion(s). Neuro: No weakness, tremor, incoordination, spasms, paresthesia or pain. Psychiatric: Denies confusion, memory loss or sensory loss. Endo: Denies change in weight, skin or hair change.  Heme/Lymph: No excessive bleeding, bruising or enlarged lymph nodes.  Physical Exam  BP 126/68   Pulse 72   Temp (!) 97.2 F (36.2 C)   Resp 16   Ht '5\' 5"'$  (1.651 m)   Wt 143 lb 3.2 oz (65 kg)   BMI 23.83 kg/m   Appears  well nourished, well groomed  and in no distress.  Eyes: PERRLA, EOMs, conjunctiva no swelling or erythema. Sinuses: No frontal/maxillary tenderness ENT/Mouth: EAC's clear, TM's nl w/o erythema, bulging. Nares clear w/o erythema, swelling, exudates. Oropharynx clear without erythema or exudates. Oral hygiene is good. Tongue normal, non obstructing. Hearing intact.  Neck: Supple. Thyroid not palpable. Car 2+/2+ without bruits, nodes or JVD. Chest: Respirations nl with BS clear & equal w/o rales, rhonchi, wheezing or stridor.  Cor: Heart sounds normal w/ regular rate and rhythm without sig. murmurs, gallops, clicks or rubs. Peripheral pulses normal and equal  without edema.  Abdomen: Soft & bowel sounds normal. Non-tender w/o guarding, rebound, hernias, masses or organomegaly.  Lymphatics: Unremarkable.  Musculoskeletal: Full ROM all peripheral extremities, joint stability, 5/5  strength and normal gait.  Skin: Warm, dry without exposed rashes, lesions or ecchymosis apparent.  Neuro: Cranial nerves intact, reflexes equal bilaterally. Sensory-motor testing grossly intact. Tendon reflexes grossly intact.  Pysch: Alert & oriented x 3.  Insight and judgement nl & appropriate. No ideations.  Assessment and Plan:  1. Essential hypertension  - Continue medication, monitor blood pressure at home.  - Continue DASH diet.  Reminder to go to the ER if any CP,  SOB, nausea, dizziness, severe HA, changes vision/speech.  - CBC with Differential/Platelet - COMPLETE METABOLIC PANEL WITH GFR - Magnesium - TSH  2. Hyperlipidemia associated with type 2 diabetes mellitus ([place])  - Continue diet/meds, exercise,& lifestyle modifications.  - Continue monitor periodic cholesterol/liver & renal functions   - Lipid panel - TSH  3. Diabetes mellitus due to underlying condition with  stage 2 chronic kidney disease, ([place])  - Continue diet, exercise  - Lifestyle modifications.  - Monitor appropriate labs.  - Hemoglobin A1c - Insulin, random  4. Vitamin D deficiency  - Continue supplementation.  - VITAMIN D 25 Hydroxyl  5. Medication management  - CBC with Differential/Platelet - COMPLETE METABOLIC PANEL WITH GFR - Magnesium - Lipid panel - TSH - Hemoglobin A1c - Insulin, random - VITAMIN D 25 Hydroxyl  Discussed  regular exercise, BP monitoring, weight control to achieve/maintain BMI less than 25 and discussed med and SE's. Recommended labs to assess and monitor clinical status with further disposition pending results of labs.  I discussed the assessment and treatment plan with the patient. The patient was provided an opportunity to ask questions and all were answered. The patient agreed with the plan and demonstrated an understanding of the instructions.  I provided over 30 minutes of exam, counseling, chart review and  complex critical decision making.   Kirtland Bouchard, MD

## 2018-09-16 NOTE — Patient Instructions (Signed)

## 2018-09-17 ENCOUNTER — Other Ambulatory Visit: Payer: Self-pay

## 2018-09-17 ENCOUNTER — Ambulatory Visit (INDEPENDENT_AMBULATORY_CARE_PROVIDER_SITE_OTHER): Payer: Medicare Other | Admitting: Internal Medicine

## 2018-09-17 ENCOUNTER — Other Ambulatory Visit: Payer: Self-pay | Admitting: *Deleted

## 2018-09-17 VITALS — BP 126/68 | HR 72 | Temp 97.2°F | Resp 16 | Ht 65.0 in | Wt 143.2 lb

## 2018-09-17 DIAGNOSIS — Z79899 Other long term (current) drug therapy: Secondary | ICD-10-CM

## 2018-09-17 DIAGNOSIS — I10 Essential (primary) hypertension: Secondary | ICD-10-CM | POA: Diagnosis not present

## 2018-09-17 DIAGNOSIS — E559 Vitamin D deficiency, unspecified: Secondary | ICD-10-CM | POA: Diagnosis not present

## 2018-09-17 DIAGNOSIS — N182 Chronic kidney disease, stage 2 (mild): Secondary | ICD-10-CM

## 2018-09-17 DIAGNOSIS — E0822 Diabetes mellitus due to underlying condition with diabetic chronic kidney disease: Secondary | ICD-10-CM | POA: Diagnosis not present

## 2018-09-17 DIAGNOSIS — E1169 Type 2 diabetes mellitus with other specified complication: Secondary | ICD-10-CM | POA: Diagnosis not present

## 2018-09-17 DIAGNOSIS — E785 Hyperlipidemia, unspecified: Secondary | ICD-10-CM

## 2018-09-17 MED ORDER — PREDNISONE 20 MG PO TABS: ORAL_TABLET | ORAL | 1 refills | Status: DC

## 2018-09-17 MED ORDER — SOLIFENACIN SUCCINATE 5 MG PO TABS: ORAL_TABLET | ORAL | 1 refills | Status: DC

## 2018-09-18 LAB — CBC WITH DIFFERENTIAL/PLATELET
Absolute Monocytes: 496 cells/uL (ref 200–950)
Basophils Absolute: 48 cells/uL (ref 0–200)
Basophils Relative: 1.2 %
Eosinophils Absolute: 80 cells/uL (ref 15–500)
Eosinophils Relative: 2 %
HCT: 38.2 % (ref 35.0–45.0)
Hemoglobin: 12.7 g/dL (ref 11.7–15.5)
Lymphs Abs: 1460 cells/uL (ref 850–3900)
MCH: 28.3 pg (ref 27.0–33.0)
MCHC: 33.2 g/dL (ref 32.0–36.0)
MCV: 85.3 fL (ref 80.0–100.0)
MPV: 11.5 fL (ref 7.5–12.5)
Monocytes Relative: 12.4 %
Neutro Abs: 1916 cells/uL (ref 1500–7800)
Neutrophils Relative %: 47.9 %
Platelets: 245 10*3/uL (ref 140–400)
RBC: 4.48 10*6/uL (ref 3.80–5.10)
RDW: 13.6 % (ref 11.0–15.0)
Total Lymphocyte: 36.5 %
WBC: 4 10*3/uL (ref 3.8–10.8)

## 2018-09-18 LAB — COMPLETE METABOLIC PANEL WITH GFR
AG Ratio: 1.4 (calc) (ref 1.0–2.5)
ALT: 19 U/L (ref 6–29)
AST: 27 U/L (ref 10–35)
Albumin: 4.1 g/dL (ref 3.6–5.1)
Alkaline phosphatase (APISO): 62 U/L (ref 37–153)
BUN: 13 mg/dL (ref 7–25)
CO2: 28 mmol/L (ref 20–32)
Calcium: 10.3 mg/dL (ref 8.6–10.4)
Chloride: 103 mmol/L (ref 98–110)
Creat: 0.82 mg/dL (ref 0.60–0.93)
GFR, Est African American: 82 mL/min/{1.73_m2} (ref >=60)
GFR, Est Non African American: 70 mL/min/{1.73_m2} (ref >=60)
Globulin: 2.9 g/dL (calc) (ref 1.9–3.7)
Glucose, Bld: 92 mg/dL (ref 65–99)
Potassium: 3.7 mmol/L (ref 3.5–5.3)
Sodium: 140 mmol/L (ref 135–146)
Total Bilirubin: 0.8 mg/dL (ref 0.2–1.2)
Total Protein: 7 g/dL (ref 6.1–8.1)

## 2018-09-18 LAB — VITAMIN D 25 HYDROXY (VIT D DEFICIENCY, FRACTURES): Vit D, 25-Hydroxy: 96 ng/mL (ref 30–100)

## 2018-09-18 LAB — LIPID PANEL
Cholesterol: 152 mg/dL (ref <200)
HDL: 44 mg/dL — ABNORMAL LOW (ref >=50)
LDL Cholesterol (Calc): 88 mg/dL (calc)
Non-HDL Cholesterol (Calc): 108 mg/dL (calc) (ref <130)
Total CHOL/HDL Ratio: 3.5 (calc) (ref <5.0)
Triglycerides: 107 mg/dL (ref <150)

## 2018-09-18 LAB — INSULIN, RANDOM: Insulin: 5.7 u[IU]/mL

## 2018-09-18 LAB — HEMOGLOBIN A1C
Hgb A1c MFr Bld: 5.6 % of total Hgb (ref <5.7)
Mean Plasma Glucose: 114 (calc)
eAG (mmol/L): 6.3 (calc)

## 2018-09-18 LAB — MAGNESIUM: Magnesium: 1.9 mg/dL (ref 1.5–2.5)

## 2018-09-18 LAB — TSH: TSH: 1.81 mIU/L (ref 0.40–4.50)

## 2018-09-22 ENCOUNTER — Encounter: Payer: Self-pay | Admitting: Internal Medicine

## 2018-10-01 ENCOUNTER — Other Ambulatory Visit: Payer: Self-pay | Admitting: Adult Health

## 2018-10-01 DIAGNOSIS — Z1231 Encounter for screening mammogram for malignant neoplasm of breast: Secondary | ICD-10-CM

## 2018-10-14 ENCOUNTER — Other Ambulatory Visit: Payer: Self-pay | Admitting: Adult Health

## 2018-11-15 ENCOUNTER — Other Ambulatory Visit: Payer: Self-pay

## 2018-11-15 ENCOUNTER — Ambulatory Visit
Admission: RE | Admit: 2018-11-15 | Discharge: 2018-11-15 | Disposition: A | Discharge status: Home / Self Care | Payer: Medicare Other | Source: Ambulatory Visit | Attending: Adult Health | Admitting: Adult Health

## 2018-11-15 DIAGNOSIS — Z1231 Encounter for screening mammogram for malignant neoplasm of breast: Secondary | ICD-10-CM

## 2018-11-21 LAB — HM DIABETES EYE EXAM

## 2018-11-25 ENCOUNTER — Other Ambulatory Visit: Payer: Self-pay | Admitting: Internal Medicine

## 2018-11-25 DIAGNOSIS — J301 Allergic rhinitis due to pollen: Secondary | ICD-10-CM

## 2018-11-28 ENCOUNTER — Encounter: Payer: Self-pay | Admitting: *Deleted

## 2018-12-20 NOTE — Progress Notes (Signed)
FOLLOW UP  Assessment and Plan:   Hypertension Well controlled with current medications  Monitor blood pressure at home; patient to call if consistently greater than 130/80 Continue DASH diet.   Reminder to go to the ER if any CP, SOB, nausea, dizziness, severe HA, changes vision/speech, left arm numbness and tingling and jaw pain.  Hyperlipidemia associated with  Currently at goal; continue statin  Continue low cholesterol diet and exercise.  Check lipid panel.   T2DM with CKD II On metformin; recently improved into normal range; consider tapering off if remains well controlled Education: Reviewed 'ABCs' of diabetes management (respective goals in parentheses):  A1C (<7), blood pressure (<130/80), and cholesterol (LDL <70) Eye Exam yearly and Dental Exam every 6 months. Dietary recommendations Physical Activity recommendations  CKD II associated with T2DM Increase fluids, avoid NSAIDS, monitor sugars, will monitor  BMI 23 Continue to recommend diet heavy in fruits and veggies and low in animal meats, cheeses, and dairy products, appropriate calorie intake Discuss exercise recommendations routinely Continue to monitor weight at each visit  Vitamin D Def At goal at last visit; continue supplementation to maintain goal of 70-100 Defer Vit D level  Continue diet and meds as discussed. Further disposition pending results of labs. Discussed med's effects and SE's.   Over 30 minutes of exam, counseling, chart review, and critical decision making was performed.   Future Appointments  Date Time Provider Bud  03/26/2019  9:00 AM Unk Pinto, MD [facility] None    ----------------------------------------------------------------------------------------------------------------------  HPI 73 y.o. female  presents for 3 month follow up on hypertension, cholesterol, diabetes, CKD II, weight and vitamin D deficiency.   She is followed by Dr. Matilde Sprang at [facility]  urology for recurrent nephrolithiasis. Vesicare for OAB is working well for her. 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   She was on topamax for residual pain from previous fracture of left femur 2011 and was doing well with this but tapered off recently and doing well.   She is on estrace 1 mg daily following hysterectomy and doing well with this.   BMI is Body mass index is 23.96 kg/m., she has been working on diet and exercise. She is on stationary bike 15-20 miles 5 days a week. Weight is down from 168-170 lb.  Wt Readings from Last 3 Encounters:  12/23/18 144 lb (65.3 kg)  09/17/18 143 lb 3.2 oz (65 kg)  06/17/18 147 lb (66.7 kg)    Her blood pressure has been controlled at home, today their BP is BP: 124/70  She does workout. She denies chest pain, shortness of breath, dizziness.   She is on cholesterol medication Atorvastatin 80 mg daily and denies myalgias. Her cholesterol is not at goal. The cholesterol last visit was:   Lab Results  Component Value Date   CHOL 152 09/17/2018   HDL 44 (L) 09/17/2018   LDLCALC 88 09/17/2018   TRIG 107 09/17/2018   CHOLHDL 3.5 09/17/2018    She has been working on diet and exercise for T2DM with CKD II on metformin down to 1 tab daily, recently improved into normal range following weight loss, and denies increased appetite, nausea, paresthesia of the feet, polydipsia, polyuria and visual disturbances. She does check fasting glucose, ranges 74-94, rarely above 100~ 115, depends on what she ate the day prior. Last A1C in the office was:  Lab Results  Component Value Date   HGBA1C 5.6 09/17/2018    She has stable CKD II asscoaited with  htn and T2DM monitored at this office:  Lab Results  Component Value Date   GFRNONAA 70 09/17/2018   Patient is on Vitamin D supplement.   Lab Results  Component Value Date   VD25OH 96 09/17/2018        Current Medications:  Current Outpatient Medications on File Prior to Visit  Medication Sig   . aspirin 81 MG chewable tablet Chew by mouth daily.  Marland Kitchen atorvastatin (LIPITOR) 80 MG tablet Take 1 tablet Daily for Cholesterol  . Blood Glucose Monitoring Suppl (ACCU-CHEK AVIVA PLUS) W/DEVICE KIT Check blood sugar 1 time daily-DX-R73.09  . Cholecalciferol (VITAMIN D PO) Take 5,000 Int'l Units by mouth daily. Take 10,000 units M,W,F and 5000 units all other days  . enalapril (VASOTEC) 20 MG tablet Take 1 tablet Daily for BP  . estradiol (ESTRACE) 1 MG tablet Take 1 tablet Daily  . glucose blood (ACCU-CHEK AVIVA PLUS) test strip USE STRIP TO CHECK BLOOD SUGAR ONCE DAILY  . hydrochlorothiazide (HYDRODIURIL) 25 MG tablet Take 1 tablet Daily for BP & Fluid  . hydrocortisone (ANUSOL-HC) 25 MG suppository Place 1 suppository (25 mg total) rectally 2 (two) times daily. For 7 days  . Lancets (ACCU-CHEK SOFT TOUCH) lancets Check blood sugar 1 time daily-DX-R73.09  . MAGNESIUM PO Take 500 mg by mouth daily.   . metFORMIN (GLUCOPHAGE) 500 MG tablet Take 1 tablet 2 x / day with meals for Diabetes  . montelukast (SINGULAIR) 10 MG tablet TAKE 1 TABLET BY MOUTH ONCE DAILY FOR ALLERGIES  . Multiple Vitamins-Minerals (MULTIVITAMIN WITH MINERALS) tablet Take 1 tablet by mouth daily.  . Omega-3 Fatty Acids (FISH OIL PO) Take by mouth daily.  . solifenacin (VESICARE) 5 MG tablet Take 1 tablet daily for bladder control.   No current facility-administered medications on file prior to visit.      Allergies: No Known Allergies   Medical History:  Past Medical History:  Diagnosis Date  . Allergy   . GERD (gastroesophageal reflux disease)   . Hyperlipidemia   . Hypertension   . Prediabetes   . Vitamin D deficiency    Family history- Reviewed and unchanged Social history- Reviewed and unchanged   Review of Systems:  Review of Systems  Constitutional: Negative for malaise/fatigue and weight loss.  HENT: Negative for hearing loss and tinnitus.   Eyes: Negative for blurred vision and double vision.   Respiratory: Negative for cough, shortness of breath and wheezing.   Cardiovascular: Negative for chest pain, palpitations, orthopnea, claudication and leg swelling.  Gastrointestinal: Negative for abdominal pain, blood in stool, constipation, diarrhea, heartburn, melena, nausea and vomiting.  Genitourinary: Negative.   Musculoskeletal: Negative for joint pain and myalgias.  Skin: Negative for rash.  Neurological: Negative for dizziness, tingling, sensory change, weakness and headaches.  Endo/Heme/Allergies: Negative for polydipsia.  Psychiatric/Behavioral: Negative.   All other systems reviewed and are negative.     Physical Exam: BP 124/70   Pulse 73   Temp (!) 97.5 F (36.4 C)   Ht '5\' 5"'$  (1.651 m)   Wt 144 lb (65.3 kg)   SpO2 97%   BMI 23.96 kg/m  Wt Readings from Last 3 Encounters:  12/23/18 144 lb (65.3 kg)  09/17/18 143 lb 3.2 oz (65 kg)  06/17/18 147 lb (66.7 kg)   General Appearance: Well nourished, in no apparent distress. Eyes: PERRLA, EOMs, conjunctiva no swelling or erythema Sinuses: No Frontal/maxillary tenderness ENT/Mouth: Ext aud canals clear, TMs without erythema, bulging. No erythema, swelling, or exudate on  post pharynx.  Tonsils not swollen or erythematous. Hearing normal.  Neck: Supple, thyroid normal.  Respiratory: Respiratory effort normal, BS equal bilaterally without rales, rhonchi, wheezing or stridor.  Cardio: RRR with no MRGs. Brisk peripheral pulses without edema.  Abdomen: Soft, + BS.  Non tender, no guarding, rebound, hernias, masses. Lymphatics: Non tender without lymphadenopathy.  Musculoskeletal: Full ROM, 5/5 strength, Normal gait Skin: Warm, dry without rashes, lesions, ecchymosis.  Neuro: Cranial nerves intact. No cerebellar symptoms.  Psych: Awake and oriented X 3, normal affect, Insight and Judgment appropriate.    Izora Ribas, NP 9:33 AM [facility]

## 2018-12-23 ENCOUNTER — Other Ambulatory Visit: Payer: Self-pay

## 2018-12-23 ENCOUNTER — Encounter: Payer: Self-pay | Admitting: Adult Health

## 2018-12-23 ENCOUNTER — Ambulatory Visit (INDEPENDENT_AMBULATORY_CARE_PROVIDER_SITE_OTHER): Payer: Medicare Other | Admitting: Adult Health

## 2018-12-23 VITALS — BP 124/70 | HR 73 | Temp 97.5°F | Ht 65.0 in | Wt 144.0 lb

## 2018-12-23 DIAGNOSIS — E0822 Diabetes mellitus due to underlying condition with diabetic chronic kidney disease: Secondary | ICD-10-CM | POA: Diagnosis not present

## 2018-12-23 DIAGNOSIS — E1169 Type 2 diabetes mellitus with other specified complication: Secondary | ICD-10-CM

## 2018-12-23 DIAGNOSIS — E559 Vitamin D deficiency, unspecified: Secondary | ICD-10-CM

## 2018-12-23 DIAGNOSIS — N182 Chronic kidney disease, stage 2 (mild): Secondary | ICD-10-CM

## 2018-12-23 DIAGNOSIS — Z6823 Body mass index (BMI) 23.0-23.9, adult: Secondary | ICD-10-CM

## 2018-12-23 DIAGNOSIS — E785 Hyperlipidemia, unspecified: Secondary | ICD-10-CM

## 2018-12-23 DIAGNOSIS — I10 Essential (primary) hypertension: Secondary | ICD-10-CM

## 2018-12-23 DIAGNOSIS — Z79899 Other long term (current) drug therapy: Secondary | ICD-10-CM

## 2018-12-23 NOTE — Patient Instructions (Addendum)
Goals    . HEMOGLOBIN A1C < 5.7    . LDL CALC < 70      Fasting glucose range - 70-100 is normal range, normal for diabetics is <130  Keep up the excellent work!    Diabetes or even increased sugars put you at 300% increased risk of heart attack and stroke.  ALSO BEING DIABETIC YOU MAY NOT HAVE ANY PAIN WITH A HEART ATTACK.  Even worse of a chance of no pain if you are a woman.  It is very unlikely that you will have any pain with a heart attack. Likely your symptoms will be very subtle, even for very severe disease.  Your symptoms for a heart attack will likely occur when you exert your self or exercise and include: Shortness of breath Sweating Nausea Dizziness Fast or irregular heart beats Fatigue   It makes me feel better if my diabetics get their heart rate up with exercise once or twice a week and pay close attention to your body. If there is ANY change in your exercise capacity or if you have symptoms above, please STOP and call 911 or call to come to the office.   PLEASE REMEMBER:  Diabetes is preventable! Up to 40 percent of complications and morbidities among individuals with type 2 diabetes can be prevented, delayed, or effectively treated and minimized with regular visits to a health professional, appropriate monitoring and medication, and a healthy diet and lifestyle.   Here is some information to help you keep your heart healthy: Move it! - Aim for 30 mins of activity every day. Take it slowly at first. Talk to [place] before starting any new exercise program.   Lose it.  -Body Mass Index (BMI) can indicate if you need to lose weight. A healthy range is 18.5-24.9. For a BMI calculator, go to [employer]  Waist Management -Excess abdominal fat is a risk factor for heart disease, diabetes, asthma, stroke and more. Ideal waist circumference is less than 35" for women and less than 40" for men.   Eat Right -focus on fruits, vegetables, whole grains, and meals you make  yourself. Avoid foods with trans fat and high sugar/sodium content.   Snooze or Snore? - Loud snoring can be a sign of sleep apnea, a significant risk factor for high blood pressure, heart attach, stroke, and heart arrhythmias.  Kick the habit -Quit Smoking! Avoid second hand smoke. A single cigarette raises your blood pressure for 20 mins and increases the risk of heart attack and stroke for the next 24 hours.   Are Aspirin and Supplements right for you? -Add ENTERIC COATED low dose 81 mg Aspirin daily OR can do every other day if you have easy bruising to protect your heart and head. As well as to reduce risk of Colon Cancer by 20 %, Skin Cancer by 26 % , Melanoma by 46% and Pancreatic cancer by 60%  Say "No to Stress -There may be little you can do about problems that cause stress. However, techniques such as long walks, meditation, and exercise can help you manage it.   Start Now! - Make changes one at a time and set reasonable goals to increase your likelihood of success.

## 2018-12-24 LAB — COMPLETE METABOLIC PANEL WITH GFR
AG Ratio: 1.5 (calc) (ref 1.0–2.5)
ALT: 21 U/L (ref 6–29)
AST: 24 U/L (ref 10–35)
Albumin: 4.1 g/dL (ref 3.6–5.1)
Alkaline phosphatase (APISO): 83 U/L (ref 37–153)
BUN: 16 mg/dL (ref 7–25)
CO2: 30 mmol/L (ref 20–32)
Calcium: 9.5 mg/dL (ref 8.6–10.4)
Chloride: 103 mmol/L (ref 98–110)
Creat: 0.86 mg/dL (ref 0.60–0.93)
GFR, Est African American: 77 mL/min/{1.73_m2} (ref >=60)
GFR, Est Non African American: 67 mL/min/{1.73_m2} (ref >=60)
Globulin: 2.8 g/dL (calc) (ref 1.9–3.7)
Glucose, Bld: 92 mg/dL (ref 65–99)
Potassium: 3.9 mmol/L (ref 3.5–5.3)
Sodium: 141 mmol/L (ref 135–146)
Total Bilirubin: 0.5 mg/dL (ref 0.2–1.2)
Total Protein: 6.9 g/dL (ref 6.1–8.1)

## 2018-12-24 LAB — HEMOGLOBIN A1C
Hgb A1c MFr Bld: 5.8 % of total Hgb — ABNORMAL HIGH (ref <5.7)
Mean Plasma Glucose: 120 (calc)
eAG (mmol/L): 6.6 (calc)

## 2018-12-24 LAB — CBC WITH DIFFERENTIAL/PLATELET
Absolute Monocytes: 447 cells/uL (ref 200–950)
Basophils Absolute: 52 cells/uL (ref 0–200)
Basophils Relative: 1.2 %
Eosinophils Absolute: 82 cells/uL (ref 15–500)
Eosinophils Relative: 1.9 %
HCT: 38.9 % (ref 35.0–45.0)
Hemoglobin: 12.9 g/dL (ref 11.7–15.5)
Lymphs Abs: 1329 cells/uL (ref 850–3900)
MCH: 28.5 pg (ref 27.0–33.0)
MCHC: 33.2 g/dL (ref 32.0–36.0)
MCV: 85.9 fL (ref 80.0–100.0)
MPV: 11.3 fL (ref 7.5–12.5)
Monocytes Relative: 10.4 %
Neutro Abs: 2391 cells/uL (ref 1500–7800)
Neutrophils Relative %: 55.6 %
Platelets: 235 10*3/uL (ref 140–400)
RBC: 4.53 10*6/uL (ref 3.80–5.10)
RDW: 13.1 % (ref 11.0–15.0)
Total Lymphocyte: 30.9 %
WBC: 4.3 10*3/uL (ref 3.8–10.8)

## 2018-12-24 LAB — LIPID PANEL
Cholesterol: 157 mg/dL (ref <200)
HDL: 50 mg/dL (ref >=50)
LDL Cholesterol (Calc): 87 mg/dL (calc)
Non-HDL Cholesterol (Calc): 107 mg/dL (calc) (ref <130)
Total CHOL/HDL Ratio: 3.1 (calc) (ref <5.0)
Triglycerides: 106 mg/dL (ref <150)

## 2018-12-24 LAB — TSH: TSH: 1.46 mIU/L (ref 0.40–4.50)

## 2018-12-24 LAB — MAGNESIUM: Magnesium: 1.9 mg/dL (ref 1.5–2.5)

## 2019-02-17 ENCOUNTER — Other Ambulatory Visit: Payer: Self-pay | Admitting: Adult Health

## 2019-02-17 ENCOUNTER — Other Ambulatory Visit: Payer: Self-pay | Admitting: Internal Medicine

## 2019-02-17 DIAGNOSIS — J301 Allergic rhinitis due to pollen: Secondary | ICD-10-CM

## 2019-02-17 MED ORDER — MONTELUKAST SODIUM 10 MG PO TABS: ORAL_TABLET | ORAL | 3 refills | Status: DC

## 2019-03-09 ENCOUNTER — Ambulatory Visit: Payer: Medicare Other | Attending: Internal Medicine

## 2019-03-09 DIAGNOSIS — Z23 Encounter for immunization: Secondary | ICD-10-CM | POA: Insufficient documentation

## 2019-03-09 NOTE — Progress Notes (Signed)
   Covid-19 Vaccination Clinic  Name:  Kaitlyn Townsend    MRN: [id number] DOB: 03/21/1945  03/09/2019  [name] was observed post Covid-19 immunization for 15 minutes without incidence. She was provided with Vaccine Information Sheet and instruction to access the V-Safe system.   [name] was instructed to call 911 with any severe reactions post vaccine: Marland Kitchen Difficulty breathing  . Swelling of your face and throat  . A fast heartbeat  . A bad rash all over your body  . Dizziness and weakness    Immunizations Administered    Name Date Dose VIS Date Route   Pfizer COVID-19 Vaccine 03/09/2019 12:35 PM 0.3 mL 12/27/2018 Intramuscular   Manufacturer: [employer]   Lot: [id number]   [place]: [id number]

## 2019-03-25 ENCOUNTER — Encounter: Payer: Self-pay | Admitting: Internal Medicine

## 2019-03-25 ENCOUNTER — Other Ambulatory Visit: Payer: Self-pay | Admitting: Internal Medicine

## 2019-03-25 ENCOUNTER — Telehealth: Payer: Self-pay | Admitting: *Deleted

## 2019-03-25 MED ORDER — CIPROFLOXACIN HCL 250 MG PO TABS: ORAL_TABLET | ORAL | 0 refills | Status: DC

## 2019-03-25 NOTE — Progress Notes (Addendum)
Annual Screening/Preventative Visit & Comprehensive Evaluation &  Examination     This very nice 74 y.o. MBF presents for a Screening /Preventative Visit & comprehensive evaluation and management of multiple medical co-morbidities.  Patient has been followed for HTN, HLD, T2_NIDDM  and Vitamin D Deficiency.      HTN predates since 1995. Patient's BP has been controlled at home and patient denies any cardiac symptoms as chest pain, palpitations, shortness of breath, dizziness or ankle swelling. Today's BP is at goal - 122/68.      Patient's hyperlipidemia is controlled with diet and medications. Patient denies myalgias or other medication SE's. Last lipids were at goal:   Lab Results  Component Value Date   CHOL 157 12/23/2018   HDL 50 12/23/2018   LDLCALC 87 12/23/2018   TRIG 106 12/23/2018   CHOLHDL 3.1 12/23/2018       Patient has hx/o  hx/o  prediabetes(A1c 6.0% / 2011)  and then T2_NIDDM  (A1c 7.4% / 2016)   and patient denies reactive hypoglycemic symptoms, visual blurring, diabetic polys, but does have painful burning  Paresthesias of distal LE's & feet. Last A1c was at goal:  Lab Results  Component Value Date   HGBA1C 5.8 (H) 12/23/2018       Finally, patient has history of Vitamin D Deficiency and last Vitamin D was at goal:  Lab Results  Component Value Date   VD25OH 96 09/17/2018    Current Outpatient Medications on File Prior to Visit  Medication Sig  . aspirin 81 MG chewable tablet Chew by mouth daily.  Marland Kitchen atorvastatin (LIPITOR) 80 MG tablet Take 1 tablet Daily for Cholesterol  . Blood Glucose Monitoring Suppl (ACCU-CHEK AVIVA PLUS) W/DEVICE KIT Check blood sugar 1 time daily-DX-R73.09  . Cholecalciferol (VITAMIN D PO) Take 5,000 Int'l Units by mouth daily. Take 10,000 units M,W,F and 5000 units all other days  . ciprofloxacin (CIPRO) 250 MG tablet Take 1 tablet 2 x /day with Meals for UTI  . enalapril (VASOTEC) 20 MG tablet Take 1 tablet Daily for BP  .  estradiol (ESTRACE) 1 MG tablet Take 1 tablet Daily  . glucose blood (ACCU-CHEK AVIVA PLUS) test strip USE STRIP TO CHECK BLOOD SUGAR ONCE DAILY  . hydrochlorothiazide (HYDRODIURIL) 25 MG tablet Take 1 tablet Daily for BP & Fluid  . hydrocortisone (ANUSOL-HC) 25 MG suppository Place 1 suppository (25 mg total) rectally 2 (two) times daily. For 7 days  . Lancets (ACCU-CHEK SOFT TOUCH) lancets Check blood sugar 1 time daily-DX-R73.09  . MAGNESIUM PO Take 500 mg by mouth daily.   . metFORMIN (GLUCOPHAGE) 500 MG tablet Take 1 tablet 2 x / day with meals for Diabetes  . montelukast (SINGULAIR) 10 MG tablet Take 1 tablet Daily for Allergies  . Multiple Vitamins-Minerals (MULTIVITAMIN WITH MINERALS) tablet Take 1 tablet by mouth daily.  . Omega-3 Fatty Acids (FISH OIL PO) Take by mouth daily.  . solifenacin (VESICARE) 5 MG tablet Take 1 tablet daily for bladder control.   No current facility-administered medications on file prior to visit.   No Known Allergies   Past Medical History:  Diagnosis Date  . Allergy   . GERD (gastroesophageal reflux disease)   . Hyperlipidemia   . Hypertension   . Prediabetes   . Vitamin D deficiency    Health Maintenance  Topic Date Due  . HEMOGLOBIN A1C  06/23/2019  . OPHTHALMOLOGY EXAM  11/21/2019  . COLONOSCOPY  02/02/2020  . FOOT EXAM  03/24/2020  .  MAMMOGRAM  11/14/2020  . TETANUS/TDAP  08/10/2022  . INFLUENZA VACCINE  Completed  . DEXA SCAN  Completed  . Hepatitis C Screening  Completed  . PNA vac Low Risk Adult  Completed   Immunization History  Administered Date(s) Administered  . Influenza Split 10/31/2012  . Influenza, High Dose Seasonal PF 09/13/2016, 11/08/2017  . Influenza-Unspecified 09/26/2014, 10/06/2015, 09/04/2018  . PFIZER SARS-COV-2 Vaccination 03/09/2019  . Pneumococcal Conjugate-13 03/09/2014  . Pneumococcal Polysaccharide-23 08/06/2015  . Td 08/09/2012  . Zoster 08/14/2012    Last Colon - 02/01/2010 - Dr Deatra Ina - Recc 10  year f/u due Jan 2022  Last MGM - 11/15/2018  Past Surgical History:  Procedure Laterality Date  . ABDOMINAL HYSTERECTOMY Bilateral 1977   bso  . KNEE SURGERY Left 09/2009   Family History  Problem Relation Age of Onset  . Heart disease Mother   . Ulcers Father   . Heart disease Brother   . Hypertension Brother   . Kidney disease Brother    Social History   Tobacco Use  . Smoking status: Former Smoker    Types: Cigarettes    Quit date: 01/16/1994    Years since quitting: 25.2  . Smokeless tobacco: Never Used  Substance Use Topics  . Alcohol use: Yes    Comment: ocassional  . Drug use: Not on file    ROS Constitutional: Denies fever, chills, weight loss/gain, headaches, insomnia,  night sweats, and change in appetite. Does c/o fatigue. Eyes: Denies redness, blurred vision, diplopia, discharge, itchy, watery eyes.  ENT: Denies discharge, congestion, post nasal drip, epistaxis, sore throat, earache, hearing loss, dental pain, Tinnitus, Vertigo, Sinus pain, snoring.  Cardio: Denies chest pain, palpitations, irregular heartbeat, syncope, dyspnea, diaphoresis, orthopnea, PND, claudication, edema Respiratory: denies cough, dyspnea, DOE, pleurisy, hoarseness, laryngitis, wheezing.  Gastrointestinal: Denies dysphagia, heartburn, reflux, water brash, pain, cramps, nausea, vomiting, bloating, diarrhea, constipation, hematemesis, melena, hematochezia, jaundice, hemorrhoids Genitourinary: Denies dysuria, frequency, urgency, nocturia, hesitancy, discharge, hematuria, flank pain Breast: Breast lumps, nipple discharge, bleeding.  Musculoskeletal: Denies arthralgia, myalgia, stiffness, Jt. Swelling, pain, limp, and strain/sprain. Denies falls. Skin: Denies puritis, rash, hives, warts, acne, eczema, changing in skin lesion Neuro: No weakness, tremor, incoordination, spasms, paresthesia, pain Psychiatric: Denies confusion, memory loss, sensory loss. Denies Depression. Endocrine: Denies change  in weight, skin, hair change, nocturia, and paresthesia, diabetic polys, visual blurring, hyper / hypo glycemic episodes.  Heme/Lymph: No excessive bleeding, bruising, enlarged lymph nodes.  Physical Exam  BP 122/68   Pulse 84   Temp (!) 96.7 F (35.9 C)   Resp 16   Ht 5' 4.5" (1.638 m)   Wt 138 lb 12.8 oz (63 kg)   BMI 23.46 kg/m   General Appearance: Well nourished, well groomed and in no apparent distress.  Eyes: PERRLA, EOMs, conjunctiva no swelling or erythema, normal fundi and vessels. Sinuses: No frontal/maxillary tenderness ENT/Mouth: EACs patent / TMs  nl. Nares clear without erythema, swelling, mucoid exudates. Oral hygiene is good. No erythema, swelling, or exudate. Tongue normal, non-obstructing. Tonsils not swollen or erythematous. Hearing normal.  Neck: Supple, thyroid not palpable. No bruits, nodes or JVD. Respiratory: Respiratory effort normal.  BS equal and clear bilateral without rales, rhonci, wheezing or stridor. Cardio: Heart sounds are normal with regular rate and rhythm and no murmurs, rubs or gallops. Peripheral pulses are normal and equal bilaterally without edema. No aortic or femoral bruits. Chest: symmetric with normal excursions and percussion. Breasts: Symmetric, without lumps, nipple discharge, retractions, or fibrocystic changes.  Abdomen: Flat, soft with bowel sounds active. Nontender, no guarding, rebound, hernias, masses, or organomegaly.  Lymphatics: Non tender without lymphadenopathy.   Musculoskeletal: Full ROM all peripheral extremities, joint stability, 5/5 strength, and normal gait. Skin: Warm and dry without rashes, lesions, cyanosis, clubbing or  ecchymosis.  Neuro: Cranial nerves intact, reflexes equal bilaterally. Normal muscle tone, no cerebellar symptoms. Sensation intact.  Pysch: Alert and oriented X 3, normal affect, Insight and Judgment appropriate.   Assessment and Plan  1. Annual Preventative Screening Examination  2. Essential  hypertension  - EKG 12-Lead - Urinalysis, Routine w reflex microscopic - Microalbumin / creatinine urine ratio - CBC with Differential/Platelet - COMPLETE METABOLIC PANEL WITH GFR - Magnesium - TSH  3. Hyperlipidemia associated with type 2 diabetes mellitus (HCC)  - EKG 12-Lead - Lipid panel  4. Type 2 diabetes mellitus with stage 1 chronic kidney disease,  without long-term current use of insulin (HCC)  - EKG 12-Lead - Urinalysis, Routine w reflex microscopic - Microalbumin / creatinine urine ratio - HM DIABETES FOOT EXAM - LOW EXTREMITY NEUR EXAM DOCUM - Hemoglobin A1c - Insulin, random  - Rx - Gabapentin for painful sensory neuropathy   5. Vitamin D deficiency  - VITAMIN D 25 Hydroxy  6. Screening for colorectal cancer  - POC Hemoccult Bld/Stl   7. Screening for ischemic heart disease  - EKG 12-Lead  8. FHx: heart disease  - EKG 12-Lead  9. Former smoker  - EKG 12-Lead  10. Medication management  - Urinalysis, Routine w reflex microscopic - Microalbumin / creatinine urine ratio - CBC with Differential/Platelet - COMPLETE METABOLIC PANEL WITH GFR - Magnesium - Lipid panel - TSH - Hemoglobin A1c - Insulin, random - VITAMIN D 25 Hydroxy  11. Dysuria  - Urine Culture       Patient was counseled in prudent diet to achieve/maintain BMI less than 25 for weight control, BP monitoring, regular exercise and medications. Discussed med's effects and SE's. Screening labs and tests as requested with regular follow-up as recommended. Over 40 minutes of exam, counseling, chart review and high complex critical decision making was performed.   Kirtland Bouchard, MD

## 2019-03-25 NOTE — Telephone Encounter (Signed)
Patient called and reported painful urination and incontinence. Dr Melford Aase sent in an RX for Cipro 250 mg for the patient. Patient is aware.

## 2019-03-25 NOTE — Patient Instructions (Signed)

## 2019-03-26 ENCOUNTER — Encounter: Payer: Self-pay | Admitting: Internal Medicine

## 2019-03-26 ENCOUNTER — Ambulatory Visit (INDEPENDENT_AMBULATORY_CARE_PROVIDER_SITE_OTHER): Payer: Medicare Other | Admitting: Internal Medicine

## 2019-03-26 ENCOUNTER — Other Ambulatory Visit: Payer: Self-pay

## 2019-03-26 VITALS — BP 122/68 | HR 84 | Temp 96.7°F | Resp 16 | Ht 64.5 in | Wt 138.8 lb

## 2019-03-26 DIAGNOSIS — E1140 Type 2 diabetes mellitus with diabetic neuropathy, unspecified: Secondary | ICD-10-CM

## 2019-03-26 DIAGNOSIS — R300 Dysuria: Secondary | ICD-10-CM

## 2019-03-26 DIAGNOSIS — Z8249 Family history of ischemic heart disease and other diseases of the circulatory system: Secondary | ICD-10-CM

## 2019-03-26 DIAGNOSIS — Z0000 Encounter for general adult medical examination without abnormal findings: Secondary | ICD-10-CM

## 2019-03-26 DIAGNOSIS — I10 Essential (primary) hypertension: Secondary | ICD-10-CM

## 2019-03-26 DIAGNOSIS — E559 Vitamin D deficiency, unspecified: Secondary | ICD-10-CM

## 2019-03-26 DIAGNOSIS — Z79899 Other long term (current) drug therapy: Secondary | ICD-10-CM

## 2019-03-26 DIAGNOSIS — Z1211 Encounter for screening for malignant neoplasm of colon: Secondary | ICD-10-CM

## 2019-03-26 DIAGNOSIS — E1122 Type 2 diabetes mellitus with diabetic chronic kidney disease: Secondary | ICD-10-CM

## 2019-03-26 DIAGNOSIS — Z136 Encounter for screening for cardiovascular disorders: Secondary | ICD-10-CM

## 2019-03-26 DIAGNOSIS — E785 Hyperlipidemia, unspecified: Secondary | ICD-10-CM

## 2019-03-26 DIAGNOSIS — Z0001 Encounter for general adult medical examination with abnormal findings: Secondary | ICD-10-CM

## 2019-03-26 DIAGNOSIS — N181 Chronic kidney disease, stage 1: Secondary | ICD-10-CM

## 2019-03-26 DIAGNOSIS — Z87891 Personal history of nicotine dependence: Secondary | ICD-10-CM

## 2019-03-26 DIAGNOSIS — E1169 Type 2 diabetes mellitus with other specified complication: Secondary | ICD-10-CM

## 2019-03-26 MED ORDER — GABAPENTIN 100 MG PO CAPS: ORAL_CAPSULE | ORAL | 0 refills | Status: DC

## 2019-03-27 ENCOUNTER — Other Ambulatory Visit: Payer: Self-pay | Admitting: Internal Medicine

## 2019-03-27 LAB — URINALYSIS, ROUTINE W REFLEX MICROSCOPIC
Bilirubin Urine: NEGATIVE
Glucose, UA: NEGATIVE
Ketones, ur: NEGATIVE
Nitrite: NEGATIVE
Specific Gravity, Urine: 1.013 (ref 1.001–1.03)
pH: 5.5 (ref 5.0–8.0)

## 2019-03-27 LAB — MICROALBUMIN / CREATININE URINE RATIO
Creatinine, Urine: 142 mg/dL (ref 20–275)
Microalb Creat Ratio: 610 mcg/mg creat — ABNORMAL HIGH (ref <30)
Microalb, Ur: 86.6 mg/dL

## 2019-03-27 LAB — COMPLETE METABOLIC PANEL WITH GFR
AG Ratio: 1.1 (calc) (ref 1.0–2.5)
ALT: 21 U/L (ref 6–29)
AST: 25 U/L (ref 10–35)
Albumin: 3.7 g/dL (ref 3.6–5.1)
Alkaline phosphatase (APISO): 73 U/L (ref 37–153)
BUN: 15 mg/dL (ref 7–25)
CO2: 26 mmol/L (ref 20–32)
Calcium: 9.3 mg/dL (ref 8.6–10.4)
Chloride: 102 mmol/L (ref 98–110)
Creat: 0.85 mg/dL (ref 0.60–0.93)
GFR, Est African American: 78 mL/min/{1.73_m2} (ref >=60)
GFR, Est Non African American: 67 mL/min/{1.73_m2} (ref >=60)
Globulin: 3.3 g/dL (calc) (ref 1.9–3.7)
Glucose, Bld: 133 mg/dL — ABNORMAL HIGH (ref 65–99)
Potassium: 3.4 mmol/L — ABNORMAL LOW (ref 3.5–5.3)
Sodium: 138 mmol/L (ref 135–146)
Total Bilirubin: 1 mg/dL (ref 0.2–1.2)
Total Protein: 7 g/dL (ref 6.1–8.1)

## 2019-03-27 LAB — CBC WITH DIFFERENTIAL/PLATELET
Absolute Monocytes: 1300 cells/uL — ABNORMAL HIGH (ref 200–950)
Basophils Absolute: 58 cells/uL (ref 0–200)
Basophils Relative: 0.5 %
Eosinophils Absolute: 23 cells/uL (ref 15–500)
Eosinophils Relative: 0.2 %
HCT: 36.8 % (ref 35.0–45.0)
Hemoglobin: 12.3 g/dL (ref 11.7–15.5)
Lymphs Abs: 1254 cells/uL (ref 850–3900)
MCH: 28.1 pg (ref 27.0–33.0)
MCHC: 33.4 g/dL (ref 32.0–36.0)
MCV: 84 fL (ref 80.0–100.0)
MPV: 10.8 fL (ref 7.5–12.5)
Monocytes Relative: 11.3 %
Neutro Abs: 8867 cells/uL — ABNORMAL HIGH (ref 1500–7800)
Neutrophils Relative %: 77.1 %
Platelets: 222 10*3/uL (ref 140–400)
RBC: 4.38 10*6/uL (ref 3.80–5.10)
RDW: 13.9 % (ref 11.0–15.0)
Total Lymphocyte: 10.9 %
WBC: 11.5 10*3/uL — ABNORMAL HIGH (ref 3.8–10.8)

## 2019-03-27 LAB — LIPID PANEL
Cholesterol: 147 mg/dL (ref <200)
HDL: 34 mg/dL — ABNORMAL LOW (ref >=50)
LDL Cholesterol (Calc): 90 mg/dL (calc)
Non-HDL Cholesterol (Calc): 113 mg/dL (calc) (ref <130)
Total CHOL/HDL Ratio: 4.3 (calc) (ref <5.0)
Triglycerides: 131 mg/dL (ref <150)

## 2019-03-27 LAB — TSH: TSH: 1.56 mIU/L (ref 0.40–4.50)

## 2019-03-27 LAB — HEMOGLOBIN A1C
Hgb A1c MFr Bld: 6.1 % of total Hgb — ABNORMAL HIGH (ref <5.7)
Mean Plasma Glucose: 128 (calc)
eAG (mmol/L): 7.1 (calc)

## 2019-03-27 LAB — VITAMIN D 25 HYDROXY (VIT D DEFICIENCY, FRACTURES): Vit D, 25-Hydroxy: 75 ng/mL (ref 30–100)

## 2019-03-27 LAB — MAGNESIUM: Magnesium: 2.1 mg/dL (ref 1.5–2.5)

## 2019-03-27 LAB — INSULIN, RANDOM: Insulin: 13.5 u[IU]/mL

## 2019-03-28 LAB — URINE CULTURE
MICRO NUMBER:: 10236661
SPECIMEN QUALITY:: ADEQUATE

## 2019-03-29 ENCOUNTER — Other Ambulatory Visit: Payer: Self-pay | Admitting: Internal Medicine

## 2019-03-29 ENCOUNTER — Encounter: Payer: Self-pay | Admitting: Internal Medicine

## 2019-03-29 DIAGNOSIS — N3001 Acute cystitis with hematuria: Secondary | ICD-10-CM

## 2019-04-02 ENCOUNTER — Ambulatory Visit: Payer: Medicare Other | Attending: Internal Medicine

## 2019-04-02 DIAGNOSIS — Z23 Encounter for immunization: Secondary | ICD-10-CM

## 2019-04-02 NOTE — Progress Notes (Signed)
   Covid-19 Vaccination Clinic  Name:  Kaitlyn Townsend    MRN: [id number] DOB: 12-09-1945  04/02/2019  [name] was observed post Covid-19 immunization for 15 minutes without incident. She was provided with Vaccine Information Sheet and instruction to access the V-Safe system.   [name] was instructed to call 911 with any severe reactions post vaccine: Marland Kitchen Difficulty breathing  . Swelling of face and throat  . A fast heartbeat  . A bad rash all over body  . Dizziness and weakness   Immunizations Administered    Name Date Dose VIS Date Route   Pfizer COVID-19 Vaccine 04/02/2019  9:13 AM 0.3 mL 12/27/2018 Intramuscular   Manufacturer: [place]   Lot: [id number]   [place]: [id number]

## 2019-04-04 ENCOUNTER — Other Ambulatory Visit: Payer: Self-pay | Admitting: *Deleted

## 2019-04-04 DIAGNOSIS — Z1211 Encounter for screening for malignant neoplasm of colon: Secondary | ICD-10-CM | POA: Diagnosis not present

## 2019-04-04 DIAGNOSIS — Z1212 Encounter for screening for malignant neoplasm of rectum: Secondary | ICD-10-CM | POA: Diagnosis not present

## 2019-04-04 LAB — POC HEMOCCULT BLD/STL (HOME/3-CARD/SCREEN)
Card #2 Fecal Occult Blod, POC: NEGATIVE
Card #3 Fecal Occult Blood, POC: NEGATIVE
Fecal Occult Blood, POC: NEGATIVE

## 2019-06-16 ENCOUNTER — Other Ambulatory Visit: Payer: Self-pay | Admitting: Internal Medicine

## 2019-07-01 ENCOUNTER — Other Ambulatory Visit: Payer: Self-pay | Admitting: Internal Medicine

## 2019-07-01 DIAGNOSIS — E1169 Type 2 diabetes mellitus with other specified complication: Secondary | ICD-10-CM

## 2019-07-01 MED ORDER — ATORVASTATIN CALCIUM 80 MG PO TABS: ORAL_TABLET | ORAL | 0 refills | Status: DC

## 2019-07-02 DIAGNOSIS — E1140 Type 2 diabetes mellitus with diabetic neuropathy, unspecified: Secondary | ICD-10-CM | POA: Insufficient documentation

## 2019-07-02 NOTE — Progress Notes (Signed)
MEDICARE ANNUAL WELLNESS VISIT AND FOLLOW UP  Assessment:    Encounter for Medicare annual wellness exam 1 year  Essential hypertension Continue medication Monitor blood pressure at home; call if consistently over 130/80 Continue DASH diet.   Reminder to go to the ER if any CP, SOB, nausea, dizziness, severe HA, changes vision/speech, left arm numbness and tingling and jaw pain.  Gastroesophageal reflux disease, esophagitis presence not specified Well managed on current medications Discussed diet, avoiding triggers and other lifestyle changes WORSE FLARE- WILL CHECK MF BACK TO ER AVOID NSAIDS NEG MURPHY'S, NO AB PAIN- FOLLOW UP IF NOT BETTER  OAB (overactive bladder) Symptoms improved with vesicare, uses one liner per day  Nephrolithiasis Increase fluids; followed by Dr. Sherron Monday  Vitamin D deficiency Near goal at recent check; continue to recommend supplementation for goal of 70-100 Defer vitamin D level  T2DM Education: Reviewed 'ABCs' of diabetes management (respective goals in parentheses):  A1C (<7), blood pressure (<130/80), and cholesterol (LDL <70) Eye Exam yearly and Dental Exam every 6 months. UTD - requested reports Dietary recommendations Physical Activity recommendations Check feet daily; no concerns; foot exam UTD   Medication management CBC, CMP/GFR, magnesium   Mixed hyperlipidemia Continue medications: atorvastatin Discussed LDL goal <70 Continue low cholesterol diet and exercise.  Check lipid panel.   Overweight (BMI 25.0-29.9) Long discussion about weight loss, diet, and exercise Recommended diet heavy in fruits and veggies and low in animal meats, cheeses, and dairy products, appropriate calorie intake Discussed appropriate weight for height and initial goal (163 lb) Follow up at next visit  Recurrent cold sores Acyclovir PRN   Over 40 minutes of exam, counseling, chart review and critical decision making was performed Future  Appointments  Date Time Provider Department Center  10/09/2019 10:30 AM Lucky Cowboy, MD [facility] None  04/05/2020  9:00 AM Lucky Cowboy, MD [facility] None     Plan:   During the course of the visit the patient was educated and counseled about appropriate screening and preventive services including:    Pneumococcal vaccine   Prevnar 13  Influenza vaccine  Td vaccine  Screening electrocardiogram  Bone densitometry screening  Colorectal cancer screening  Diabetes screening  Glaucoma screening  Nutrition counseling   Advanced directives: requested   Subjective:  Kaitlyn Townsend is a 74 y.o. female who presents for Medicare Annual Wellness Visit and 3 month follow up.   She is followed by Dr. Sherron Monday at [facility] urology for recurrent nephrolithiasis. Vesicare for OAB is working well for her.  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   She was on topamax for residual pain from previous fracture of left femur 2011 and was doing well with this but tapered off recently and doing well.   Had a UTI in march, was treated, no symptoms, will recheck.   She is beltching, gets relief with burping, feels like something is in her throat. Not with exertion, she is able to bike and mow the grass without issues. Her mom had CHF and died at 52. No chest pain, no SOB.   She is on estrace 1 mg daily following hysterectomy and doing well with this. She is on bASA and she is active. Willing to try 1/2 a day.   BMI is Body mass index is 25.06 kg/m., she has been working on diet and exercise. She is on stationary bike 15-20 miles 5 days a week. Weight is down from 168-170 lb.  Wt Readings from Last 3 Encounters:  07/03/19  146 lb (66.2 kg)  03/26/19 138 lb 12.8 oz (63 kg)  12/23/18 144 lb (65.3 kg)    Her blood pressure has been controlled at home, today their BP is BP: 124/62 She does workout. She denies chest pain, shortness of breath, dizziness.   She is on  cholesterol medication (atorvastatin 40 mg daily) and denies myalgias. Her LDL cholesterol is not at goal of LDL <70 at last check. The cholesterol last visit was:   Lab Results  Component Value Date   CHOL 147 03/26/2019   HDL 34 (L) 03/26/2019   LDLCALC 90 03/26/2019   TRIG 131 03/26/2019   CHOLHDL 4.3 03/26/2019    She has been working on diet and exercise for T2DM on metformin 500 mg twice daily- was on ER but switched to regular, has been having worsening GERD, no NSAIDS, no ETOH- has been on omeprazole and denies foot ulcerations, increased appetite, nausea, paresthesia of the feet, polydipsia, polyuria, visual disturbances, vomiting and weight loss. She does check fasting sugars, range (84-108). Last A1C in the office was:  Lab Results  Component Value Date   HGBA1C 6.1 (H) 03/26/2019   Last GFR: Lab Results  Component Value Date   GFRNONAA 67 03/26/2019   Patient is on Vitamin D supplement and approaching goal at recent check:    Lab Results  Component Value Date   VD25OH 75 03/26/2019      Medication Review:  Current Outpatient Medications (Endocrine & Metabolic):  .  estradiol (ESTRACE) 1 MG tablet, Take 1 tablet Daily .  metFORMIN (GLUCOPHAGE XR) 500 MG 24 hr tablet, 1 tablet PO BID with largest meals.  Current Outpatient Medications (Cardiovascular):  .  atorvastatin (LIPITOR) 80 MG tablet, Take 1 tablet Daily for Cholesterol .  enalapril (VASOTEC) 20 MG tablet, Take 1 tablet Daily for BP .  hydrochlorothiazide (HYDRODIURIL) 25 MG tablet, Take 1 tablet Daily for BP & Fluid Retention (Ankle Swelling)  Current Outpatient Medications (Respiratory):  .  montelukast (SINGULAIR) 10 MG tablet, Take 1 tablet Daily for Allergies  Current Outpatient Medications (Analgesics):  .  aspirin 81 MG chewable tablet, Chew by mouth daily.   Current Outpatient Medications (Other):  .  Blood Glucose Monitoring Suppl (ACCU-CHEK AVIVA PLUS) w/Device KIT, Check blood sugar 1 time  daily-DX-R73.09 .  Cholecalciferol (VITAMIN D PO), Take 5,000 Int'l Units by mouth daily. Take 10,000 units M,W,F and 5000 units all other days .  gabapentin (NEURONTIN) 100 MG capsule, Take 1 to 3 capsules 2 to 3 x/day as needed for Nerve Pain .  glucose blood (ACCU-CHEK AVIVA PLUS) test strip, USE STRIP TO CHECK BLOOD SUGAR ONCE DAILY .  hydrocortisone (ANUSOL-HC) 25 MG suppository, Place 1 suppository (25 mg total) rectally 2 (two) times daily. For 7 days .  Lancets (ACCU-CHEK SOFT TOUCH) lancets, Check blood sugar 1 time daily-DX-R73.09 .  MAGNESIUM PO, Take 500 mg by mouth daily.  .  Multiple Vitamins-Minerals (MULTIVITAMIN WITH MINERALS) tablet, Take 1 tablet by mouth daily. .  Omega-3 Fatty Acids (FISH OIL PO), Take by mouth daily. .  solifenacin (VESICARE) 5 MG tablet, Take 1 tablet Daily for Bladder .  blood glucose meter kit and supplies, Test sugars as directed by provider. Dispense based insurance preference. E11.22 .  omeprazole (PRILOSEC) 20 MG capsule, Take 1 capsule (20 mg total) by mouth daily.  No Known Allergies  Current Problems (verified) Patient Active Problem List   Diagnosis Date Noted  . 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 Diabetes mellitus due to underlying condition with stage 2 chronic kidney disease, without long-term current use of insulin ([place]) 11/22/2017  . Former smoker 08/21/2017  . FHx: heart disease 08/21/2017  . Recurrent cold sores 05/14/2017  . Nephrolithiasis 02/16/2016  . OAB (overactive bladder) 02/16/2016  . Medication management 02/13/2013  . Hyperlipidemia associated with type 2 diabetes mellitus ([place])   . Essential hypertension   . Type 2 diabetes mellitus with hyperlipidemia ([place])   . Vitamin D deficiency   . GERD (gastroesophageal reflux disease)     Screening Tests Immunization History  Administered Date(s) Administered  . Influenza Split 10/31/2012  . Influenza, High Dose Seasonal PF 09/13/2016,  11/08/2017  . Influenza-Unspecified 09/26/2014, 10/06/2015, 09/04/2018  . PFIZER SARS-COV-2 Vaccination 03/09/2019, 04/02/2019  . Pneumococcal Conjugate-13 03/09/2014  . Pneumococcal Polysaccharide-23 08/06/2015  . Td 08/09/2012  . Zoster 08/14/2012   Health Maintenance  Topic Date Due  . INFLUENZA VACCINE  08/17/2019  . HEMOGLOBIN A1C  09/26/2019  . OPHTHALMOLOGY EXAM  11/21/2019  . COLONOSCOPY  02/02/2020  . FOOT EXAM  03/24/2020  . MAMMOGRAM  11/14/2020  . TETANUS/TDAP  08/10/2022  . DEXA SCAN  Completed  . COVID-19 Vaccine  Completed  . Hepatitis C Screening  Completed  . PNA vac Low Risk Adult  Completed    Preventative care: Last colonoscopy: 2012 Mammogram:10/2018 Last pap smear/pelvic exam: remote; s/p total hysterectomy DEXA: 2019, normal   Names of Other Physician/Practitioners you currently use: 1. [facility] here for primary care 2. [facility], eye doctor, last visit 2020 3. Dr. Deatra Ina, dentist, last visit 2019  Patient Care Team: Unk Pinto, MD as PCP - General (Internal Medicine) Inda Castle, MD (Inactive) as Consulting Physician (Gastroenterology) Bjorn Loser, MD as Consulting Physician (Urology)  SURGICAL HISTORY She  has a past surgical history that includes Knee surgery (Left, 09/2009) and Abdominal hysterectomy (Bilateral, 1977). FAMILY HISTORY Her family history includes Heart disease in her brother and mother; Hypertension in her brother; Kidney disease in her brother; Ulcers in her father. SOCIAL HISTORY She  reports that she quit smoking about 25 years ago. Her smoking use included cigarettes. She has never used smokeless tobacco. She reports current alcohol use.   MEDICARE WELLNESS OBJECTIVES: Physical activity: Current Exercise Habits: The patient does not participate in regular exercise at present Cardiac risk factors: Cardiac Risk Factors include: advanced age (>27mn, >>25 women);diabetes mellitus;dyslipidemia;hypertension;sedentary lifestyle Depression/mood screen:   [facility] 07/03/2019  Decreased Interest 0  Down, Depressed, Hopeless 0  PHQ - 2 Score 0    ADLs:  In your present state of health, do you have any difficulty performing the following activities: 07/03/2019 03/25/2019  Hearing? N N  Vision? N N  Difficulty concentrating or making decisions? N N  Walking or climbing stairs? N N  Dressing or bathing? N N  Doing errands, shopping? N N  Some recent data might be hidden     Cognitive Testing  Alert? Yes  Normal Appearance?Yes  Oriented to person? Yes  Place? Yes   Time? Yes  Recall of three objects?  Yes  Can perform simple calculations? Yes  Displays appropriate judgment?Yes  Can read the correct time from a watch face?Yes  EOL planning: Does Patient Have a Medical Advance Directive?: Yes Type of Advance Directive: Healthcare Power of Attorney, Living will Copy of [place]in Chart?: No - copy requested  Review of Systems  Constitutional: Negative for malaise/fatigue and weight loss.  HENT: Negative for hearing loss and tinnitus.   Eyes: Negative for blurred vision and double vision.  Respiratory: Negative for cough, sputum production, shortness of breath and wheezing.   Cardiovascular: Negative for chest pain, palpitations, orthopnea, claudication, leg swelling and PND.  Gastrointestinal: Negative for abdominal pain, blood in stool, constipation, diarrhea, heartburn, melena, nausea and vomiting.  Genitourinary: Negative.   Musculoskeletal: Negative for falls, joint pain and myalgias.  Skin: Negative for rash.  Neurological: Negative for dizziness, tingling, sensory change, weakness and headaches.  Endo/Heme/Allergies: Negative for polydipsia.  Psychiatric/Behavioral: Negative.  Negative for depression, memory loss, substance abuse and suicidal ideas. The patient is not nervous/anxious and does not  have insomnia.   All other systems reviewed and are negative.    Objective:     Today's Vitals   07/03/19 1020  BP: 124/62  Pulse: 77  Temp: (!) 97.3 F (36.3 C)  SpO2: 96%  Weight: 146 lb (66.2 kg)  Height: '5\' 4"'$  (1.626 m)  PainSc: 0-No pain   Body mass index is 25.06 kg/m.  General appearance: alert, no distress, WD/WN, female HEENT: normocephalic, sclerae anicteric, TMs pearly, nares patent, no discharge or erythema, pharynx normal Oral cavity: MMM, no lesions Neck: supple, no lymphadenopathy, no thyromegaly, no masses Heart: RRR, normal S1, S2, no murmurs Lungs: CTA bilaterally, no wheezes, rhonchi, or rales Abdomen: +bs, soft, non tender, non distended, no masses, no hepatomegaly, no splenomegaly Musculoskeletal: nontender, no swelling, no obvious deformity Extremities: no edema, no cyanosis, no clubbing Pulses: 2+ symmetric, upper and lower extremities, normal cap refill Neurological: alert, oriented x 3, CN2-12 intact, strength normal upper extremities and lower extremities, sensation normal throughout, DTRs 2+ throughout, no cerebellar signs, gait normal Psychiatric: normal affect, behavior normal, pleasant   Medicare Attestation I have personally reviewed: The patient's medical and social history Their use of alcohol, tobacco or illicit drugs Their current medications and supplements The patient's functional ability including ADLs,fall risks, home safety risks, cognitive, and hearing and visual impairment Diet and physical activities Evidence for depression or mood disorders  The patient's weight, height, BMI, and visual acuity have been recorded in the chart.  I have made referrals, counseling, and provided education to the patient based on review of the above and I have provided the patient with a written personalized care plan for preventive services.     Vicie Mutters, PA-C   07/03/2019

## 2019-07-03 ENCOUNTER — Other Ambulatory Visit: Payer: Self-pay

## 2019-07-03 ENCOUNTER — Encounter: Payer: Self-pay | Admitting: Physician Assistant

## 2019-07-03 ENCOUNTER — Ambulatory Visit (INDEPENDENT_AMBULATORY_CARE_PROVIDER_SITE_OTHER): Payer: Medicare Other | Admitting: Physician Assistant

## 2019-07-03 ENCOUNTER — Ambulatory Visit: Payer: Medicare Other | Admitting: Adult Health

## 2019-07-03 VITALS — BP 124/62 | HR 77 | Temp 97.3°F | Ht 64.0 in | Wt 146.0 lb

## 2019-07-03 DIAGNOSIS — E1169 Type 2 diabetes mellitus with other specified complication: Secondary | ICD-10-CM

## 2019-07-03 DIAGNOSIS — N3281 Overactive bladder: Secondary | ICD-10-CM

## 2019-07-03 DIAGNOSIS — I10 Essential (primary) hypertension: Secondary | ICD-10-CM | POA: Diagnosis not present

## 2019-07-03 DIAGNOSIS — Z0001 Encounter for general adult medical examination with abnormal findings: Secondary | ICD-10-CM | POA: Diagnosis not present

## 2019-07-03 DIAGNOSIS — Z87891 Personal history of nicotine dependence: Secondary | ICD-10-CM

## 2019-07-03 DIAGNOSIS — N182 Chronic kidney disease, stage 2 (mild): Secondary | ICD-10-CM

## 2019-07-03 DIAGNOSIS — Z8249 Family history of ischemic heart disease and other diseases of the circulatory system: Secondary | ICD-10-CM

## 2019-07-03 DIAGNOSIS — E1140 Type 2 diabetes mellitus with diabetic neuropathy, unspecified: Secondary | ICD-10-CM

## 2019-07-03 DIAGNOSIS — K219 Gastro-esophageal reflux disease without esophagitis: Secondary | ICD-10-CM

## 2019-07-03 DIAGNOSIS — E0822 Diabetes mellitus due to underlying condition with diabetic chronic kidney disease: Secondary | ICD-10-CM | POA: Diagnosis not present

## 2019-07-03 DIAGNOSIS — K2100 Gastro-esophageal reflux disease with esophagitis, without bleeding: Secondary | ICD-10-CM

## 2019-07-03 DIAGNOSIS — Z0000 Encounter for general adult medical examination without abnormal findings: Secondary | ICD-10-CM

## 2019-07-03 DIAGNOSIS — E785 Hyperlipidemia, unspecified: Secondary | ICD-10-CM

## 2019-07-03 DIAGNOSIS — Z79899 Other long term (current) drug therapy: Secondary | ICD-10-CM

## 2019-07-03 DIAGNOSIS — R6889 Other general symptoms and signs: Secondary | ICD-10-CM

## 2019-07-03 DIAGNOSIS — E559 Vitamin D deficiency, unspecified: Secondary | ICD-10-CM

## 2019-07-03 DIAGNOSIS — R8290 Unspecified abnormal findings in urine: Secondary | ICD-10-CM

## 2019-07-03 DIAGNOSIS — N200 Calculus of kidney: Secondary | ICD-10-CM

## 2019-07-03 MED ORDER — ACCU-CHEK AVIVA PLUS W/DEVICE KIT: PACK | 0 refills | Status: DC

## 2019-07-03 MED ORDER — METFORMIN HCL ER 500 MG PO TB24: ORAL_TABLET | ORAL | 3 refills | Status: DC

## 2019-07-03 MED ORDER — OMEPRAZOLE 20 MG PO CPDR: 20.0000 mg | DELAYED_RELEASE_CAPSULE | Freq: Every day | ORAL | 1 refills | Status: AC

## 2019-07-03 MED ORDER — BLOOD GLUCOSE METER KIT: PACK | 0 refills | Status: AC

## 2019-07-03 NOTE — Patient Instructions (Addendum)
Take omeprazole   Avoid alcohol, spicy foods, NSAIDS (aleve, ibuprofen) at this time.  See foods below.   Food Choices for Gastroesophageal Reflux Disease When you have gastroesophageal reflux disease (GERD), the foods you eat and your eating habits are very important. Choosing the right foods can help ease the discomfort of GERD. WHAT GENERAL GUIDELINES DO I NEED TO FOLLOW?  Choose fruits, vegetables, whole grains, low-fat dairy products, and low-fat meat, fish, and poultry.  Limit fats such as oils, salad dressings, butter, nuts, and avocado.  Keep a food diary to identify foods that cause symptoms.  Avoid foods that cause reflux. These may be different for different people.  Eat frequent small meals instead of three large meals each day.  Eat your meals slowly, in a relaxed setting.  Limit fried foods.  Cook foods using methods other than frying.  Avoid drinking alcohol.  Avoid drinking large amounts of liquids with your meals.  Avoid bending over or lying down until 2-3 hours after eating. WHAT FOODS ARE NOT RECOMMENDED? The following are some foods and drinks that may worsen your symptoms: Vegetables Tomatoes. Tomato juice. Tomato and spaghetti sauce. Chili peppers. Onion and garlic. Horseradish. Fruits Oranges, grapefruit, and lemon (fruit and juice). Meats High-fat meats, fish, and poultry. This includes hot dogs, ribs, ham, sausage, salami, and bacon. Dairy Whole milk and chocolate milk. Sour cream. Cream. Butter. Ice cream. Cream cheese.  Beverages Coffee and tea, with or without caffeine. Carbonated beverages or energy drinks. Condiments Hot sauce. Barbecue sauce.  Sweets/Desserts Chocolate and cocoa. Donuts. Peppermint and spearmint. Fats and Oils High-fat foods, including [place] fries and potato chips. Other Vinegar. Strong spices, such as black pepper, white pepper, red pepper, cayenne, curry powder, cloves, ginger, and chili powder.  WOMEN AND HEART  ATTACKS  Being a woman you may not have the typical symptoms of a heart attack.  You may not have any pain OR you may have atypical pain such as jaw pain, upper back pain, arm pain, "my bra feels to tight" and you will often have symptoms with it like below.  Symptoms for a heart attack will likely occur when you exert your self or exercise and include: Shortness of breath Sweating Nausea Dizziness Fast or irregular heart beats Fatigue   It makes me feel better if my patients get their heart rate up with exercise once or twice a week and pay close attention to your body. If there is ANY change in your exercise capacity or if you have symptoms above, please STOP and call 911 or call to come to the office.   Here is some information to help you keep your heart healthy: Move it! - Aim for 30 mins of activity every day. Take it slowly at first. Talk to [place] before starting any new exercise program.   Lose it.  -Body Mass Index (BMI) can indicate if you need to lose weight. A healthy range is 18.5-24.9. For a BMI calculator, go to [employer]  Waist Management -Excess abdominal fat is a risk factor for heart disease, diabetes, asthma, stroke and more. Ideal waist circumference is less than 35" for women and less than 40" for men.   Eat Right -focus on fruits, vegetables, whole grains, and meals you make yourself. Avoid foods with trans fat and high sugar/sodium content.   Snooze or Snore? - Loud snoring can be a sign of sleep apnea, a significant risk factor for high blood pressure, heart attach, stroke, and heart arrhythmias.  Kick  the habit -Quit Smoking! Avoid second hand smoke. A single cigarette raises your blood pressure for 20 mins and increases the risk of heart attack and stroke for the next 24 hours.   Are Aspirin and Supplements right for you? -Add ENTERIC COATED low dose 81 mg Aspirin daily OR can do every other day if you have easy bruising to protect your heart and head.  As well as to reduce risk of Colon Cancer by 20 %, Skin Cancer by 26 % , Melanoma by 46% and Pancreatic cancer by 60%  Say "No to Stress -There may be little you can do about problems that cause stress. However, techniques such as long walks, meditation, and exercise can help you manage it.   Start Now! - Make changes one at a time and set reasonable goals to increase your likelihood of success.     Ask insurance and pharmacy about shingrix - it is a 2 part shot that we will not be getting in the office.   Suggest getting AFTER covid vaccines, have to wait at least a month This shot can make you feel bad due to such good immune response it can trigger some inflammation so take tylenol or aleve day of or day after and plan on resting.   Can go to [email or web address] for more information  Shingrix Vaccination  Two vaccines are licensed and recommended to prevent shingles in the [place] Zoster vaccine live (ZVL, Zostavax) has been in use since 2006. Recombinant zoster vaccine (RZV, Shingrix), has been in use since 2017 and is recommended by AC[employer] as the preferred shingles vaccine.  What Everyone Should Know about Shingles Vaccine (Shingrix) One of the Recommended Vaccines by Disease Shingles vaccination is the only way to protect against shingles and postherpetic neuralgia (PHN), the most common complication from shingles. CDC recommends that healthy adults 50 years and older get two doses of the shingles vaccine called Shingrix (recombinant zoster vaccine), separated by 2 to 6 months, to prevent shingles and the complications from the disease. Your doctor or pharmacist can give you Shingrix as a shot in your upper arm. Shingrix provides strong protection against shingles and PHN. Two doses of Shingrix is more than 90% effective at preventing shingles and PHN. Protection stays above 85% for at least the first four years after you get vaccinated.  Shingrix is the preferred vaccine, over Zostavax (zoster vaccine live), a shingles vaccine in use since 2006. Zostavax may still be used to prevent shingles in healthy adults 60 years and older. For example, you could use Zostavax if a person is allergic to Shingrix, prefers Zostavax, or requests immediate vaccination and Shingrix is unavailable. Who Should Get Shingrix? Healthy adults 50 years and older should get two doses of Shingrix, separated by 2 to 6 months. You should get Shingrix even if in the past you . had shingles  . received Zostavax  . are not sure if you had chickenpox There is no maximum age for getting Shingrix. If you had shingles in the past, you can get Shingrix to help prevent future occurrences of the disease. There is no specific length of time that you need to wait after having shingles before you can receive Shingrix, but generally you should make sure the shingles rash has gone away before getting vaccinated. You can get Shingrix whether or not you remember having had chickenpox in the past. Studies show that more than 99% of Americans 40 years and older have had chickenpox, even if they don't remember  having the disease. Chickenpox and shingles are related because they are caused by the same virus (varicella zoster virus). After a person recovers from chickenpox, the virus stays dormant (inactive) in the body. It can reactivate years later and cause shingles. If you had Zostavax in the recent past, you should wait at least eight weeks before getting Shingrix. Talk to your healthcare provider to determine the best time to get Shingrix. Shingrix is available in [facility] and pharmacies. To find doctor's offices or pharmacies near you that offer the vaccine, visit [employer] Vaccine FinderExternal. If you have questions about Shingrix, talk with your healthcare provider. Vaccine for Those 63 Years and Older  Shingrix reduces the risk of shingles and PHN by more than 90%  in people 69 and older. CDC recommends the vaccine for healthy adults 65 and older.  Who Should Not Get Shingrix? You should not get Shingrix if you: . have ever had a severe allergic reaction to any component of the vaccine or after a dose of Shingrix  . tested negative for immunity to varicella zoster virus. If you test negative, you should get chickenpox vaccine.  . currently have shingles  . currently are pregnant or breastfeeding. Women who are pregnant or breastfeeding should wait to get Shingrix.  Marland Kitchen receive specific antiviral drugs (acyclovir, famciclovir, or valacyclovir) 24 hours before vaccination (avoid use of these antiviral drugs for 14 days after vaccination)- zoster vaccine live only If you have a minor acute (starts suddenly) illness, such as a cold, you may get Shingrix. But if you have a moderate or severe acute illness, you should usually wait until you recover before getting the vaccine. This includes anyone with a temperature of 101.49F or higher. The side effects of the Shingrix are temporary, and usually last 2 to 3 days. While you may experience pain for a few days after getting Shingrix, the pain will be less severe than having shingles and the complications from the disease. How Well Does Shingrix Work? Two doses of Shingrix provides strong protection against shingles and postherpetic neuralgia (PHN), the most common complication of shingles. . In adults 61 to 75 years old who got two doses, Shingrix was 97% effective in preventing shingles; among adults 70 years and older, Shingrix was 91% effective.  . In adults 106 to 75 years old who got two doses, Shingrix was 91% effective in preventing PHN; among adults 70 years and older, Shingrix was 89% effective. Shingrix protection remained high (more than 85%) in people 70 years and older throughout the four years following vaccination. Since your risk of shingles and PHN increases as you get older, it is important to have strong  protection against shingles in your older years. Top of Page  What Are the Possible Side Effects of Shingrix? Studies show that Shingrix is safe. The vaccine helps your body create a strong defense against shingles. As a result, you are likely to have temporary side effects from getting the shots. The side effects may affect your ability to do normal daily activities for 2 to 3 days. Most people got a sore arm with mild or moderate pain after getting Shingrix, and some also had redness and swelling where they got the shot. Some people felt tired, had muscle pain, a headache, shivering, fever, stomach pain, or nausea. About 1 out of 6 people who got Shingrix experienced side effects that prevented them from doing regular activities. Symptoms went away on their own in about 2 to 3 days. Side effects  were more common in younger people. You might have a reaction to the first or second dose of Shingrix, or both doses. If you experience side effects, you may choose to take over-the-counter pain medicine such as ibuprofen or acetaminophen. If you experience side effects from Shingrix, you should report them to the Vaccine Adverse Event Reporting System (VAERS). Your doctor might file this report, or you can do it yourself through the VAERS websiteExternal, or by calling [phone]. If you have any questions about side effects from Shingrix, talk with your doctor. The shingles vaccine does not contain thimerosal (a preservative containing mercury). Top of Page  When Should I See a Doctor Because of the Side Effects I Experience From Shingrix? In clinical trials, Shingrix was not associated with serious adverse events. In fact, serious side effects from vaccines are extremely rare. For example, for every 1 million doses of a vaccine given, only one or two people may have a severe allergic reaction. Signs of an allergic reaction happen within minutes or hours after vaccination and include hives, swelling of the  face and throat, difficulty breathing, a fast heartbeat, dizziness, or weakness. If you experience these or any other life-threatening symptoms, see a doctor right away. Shingrix causes a strong response in your immune system, so it may produce short-term side effects more intense than you are used to from other vaccines. These side effects can be uncomfortable, but they are expected and usually go away on their own in 2 or 3 days. Top of Page  How Can I Pay For Shingrix? There are several ways shingles vaccine may be paid for: Medicare . Medicare Part D plans cover the shingles vaccine, but there may be a cost to you depending on your plan. There may be a copay for the vaccine, or you may need to pay in full then get reimbursed for a certain amount.  . Medicare Part B does not cover the shingles vaccine. Medicaid . Medicaid may or may not cover the vaccine. Contact your insurer to find out. Private health insurance . Many private health insurance plans will cover the vaccine, but there may be a cost to you depending on your plan. Contact your insurer to find out. Vaccine assistance programs . Some pharmaceutical companies provide vaccines to eligible adults who cannot afford them. You may want to check with the vaccine manufacturer, [employer], about Shingrix. If you do not currently have health insurance, learn more about affordable health coverage optionsExternal. To find doctor's offices or pharmacies near you that offer the vaccine, visit HealthMap Vaccine FinderExternal.

## 2019-07-04 LAB — URINALYSIS, ROUTINE W REFLEX MICROSCOPIC
Bilirubin Urine: NEGATIVE
Glucose, UA: NEGATIVE
Hgb urine dipstick: NEGATIVE
Hyaline Cast: NONE SEEN /LPF
Ketones, ur: NEGATIVE
Nitrite: NEGATIVE
Protein, ur: NEGATIVE
Specific Gravity, Urine: 1.012 (ref 1.001–1.03)
WBC, UA: NONE SEEN /HPF (ref 0–5)
pH: 7.5 (ref 5.0–8.0)

## 2019-07-04 LAB — CBC WITH DIFFERENTIAL/PLATELET
Absolute Monocytes: 550 cells/uL (ref 200–950)
Basophils Absolute: 52 cells/uL (ref 0–200)
Basophils Relative: 1.1 %
Eosinophils Absolute: 99 cells/uL (ref 15–500)
Eosinophils Relative: 2.1 %
HCT: 39.4 % (ref 35.0–45.0)
Hemoglobin: 12.9 g/dL (ref 11.7–15.5)
Lymphs Abs: 1678 cells/uL (ref 850–3900)
MCH: 28 pg (ref 27.0–33.0)
MCHC: 32.7 g/dL (ref 32.0–36.0)
MCV: 85.5 fL (ref 80.0–100.0)
MPV: 11.1 fL (ref 7.5–12.5)
Monocytes Relative: 11.7 %
Neutro Abs: 2322 cells/uL (ref 1500–7800)
Neutrophils Relative %: 49.4 %
Platelets: 263 10*3/uL (ref 140–400)
RBC: 4.61 10*6/uL (ref 3.80–5.10)
RDW: 14.1 % (ref 11.0–15.0)
Total Lymphocyte: 35.7 %
WBC: 4.7 10*3/uL (ref 3.8–10.8)

## 2019-07-04 LAB — URINE CULTURE
MICRO NUMBER:: 10604201
SPECIMEN QUALITY:: ADEQUATE

## 2019-07-04 LAB — COMPLETE METABOLIC PANEL WITH GFR
AG Ratio: 1.4 (calc) (ref 1.0–2.5)
ALT: 19 U/L (ref 6–29)
AST: 22 U/L (ref 10–35)
Albumin: 4.1 g/dL (ref 3.6–5.1)
Alkaline phosphatase (APISO): 72 U/L (ref 37–153)
BUN: 12 mg/dL (ref 7–25)
CO2: 34 mmol/L — ABNORMAL HIGH (ref 20–32)
Calcium: 9.9 mg/dL (ref 8.6–10.4)
Chloride: 103 mmol/L (ref 98–110)
Creat: 0.74 mg/dL (ref 0.60–0.93)
GFR, Est African American: 92 mL/min/{1.73_m2} (ref >=60)
GFR, Est Non African American: 80 mL/min/{1.73_m2} (ref >=60)
Globulin: 3 g/dL (calc) (ref 1.9–3.7)
Glucose, Bld: 92 mg/dL (ref 65–99)
Potassium: 4.1 mmol/L (ref 3.5–5.3)
Sodium: 141 mmol/L (ref 135–146)
Total Bilirubin: 0.5 mg/dL (ref 0.2–1.2)
Total Protein: 7.1 g/dL (ref 6.1–8.1)

## 2019-07-04 LAB — LIPID PANEL
Cholesterol: 147 mg/dL (ref <200)
HDL: 49 mg/dL — ABNORMAL LOW (ref >=50)
LDL Cholesterol (Calc): 82 mg/dL (calc)
Non-HDL Cholesterol (Calc): 98 mg/dL (calc) (ref <130)
Total CHOL/HDL Ratio: 3 (calc) (ref <5.0)
Triglycerides: 80 mg/dL (ref <150)

## 2019-07-04 LAB — VITAMIN D 25 HYDROXY (VIT D DEFICIENCY, FRACTURES): Vit D, 25-Hydroxy: 83 ng/mL (ref 30–100)

## 2019-07-04 LAB — HEMOGLOBIN A1C
Hgb A1c MFr Bld: 5.7 % of total Hgb — ABNORMAL HIGH (ref <5.7)
Mean Plasma Glucose: 117 (calc)
eAG (mmol/L): 6.5 (calc)

## 2019-07-04 LAB — TSH: TSH: 1.38 mIU/L (ref 0.40–4.50)

## 2019-07-04 LAB — MAGNESIUM: Magnesium: 1.7 mg/dL (ref 1.5–2.5)

## 2019-07-04 NOTE — Addendum Note (Signed)
Addended by: Vicie Mutters R on: 07/04/2019 01:32 PM   Modules accepted: Orders

## 2019-07-21 ENCOUNTER — Other Ambulatory Visit: Payer: Self-pay | Admitting: Internal Medicine

## 2019-08-19 ENCOUNTER — Other Ambulatory Visit: Payer: Self-pay

## 2019-08-19 ENCOUNTER — Ambulatory Visit (INDEPENDENT_AMBULATORY_CARE_PROVIDER_SITE_OTHER): Payer: Medicare Other

## 2019-08-19 VITALS — BP 112/66 | HR 78 | Temp 97.5°F | Wt 140.0 lb

## 2019-08-19 DIAGNOSIS — R300 Dysuria: Secondary | ICD-10-CM

## 2019-08-19 NOTE — Progress Notes (Signed)
Patient presents to the office for a nurse visit to provide a urine specimen.  States that she has experienced dysuria without hematuria and pressure for three weeks. Urine output will only allow for a culture. Vitals taken and recorded.

## 2019-08-22 ENCOUNTER — Other Ambulatory Visit: Payer: Self-pay | Admitting: Internal Medicine

## 2019-08-22 DIAGNOSIS — N3000 Acute cystitis without hematuria: Secondary | ICD-10-CM

## 2019-08-22 LAB — URINE CULTURE
MICRO NUMBER:: 10782618
SPECIMEN QUALITY:: ADEQUATE

## 2019-08-22 MED ORDER — CIPROFLOXACIN HCL 250 MG PO TABS
ORAL_TABLET | ORAL | 0 refills | Status: DC
Start: 2019-08-22 — End: 2019-10-09

## 2019-08-22 MED ORDER — PHENAZOPYRIDINE HCL 100 MG PO TABS: ORAL_TABLET | ORAL | 0 refills | Status: DC

## 2019-09-15 ENCOUNTER — Other Ambulatory Visit: Payer: Self-pay | Admitting: Internal Medicine

## 2019-09-26 ENCOUNTER — Other Ambulatory Visit: Payer: Self-pay | Admitting: Internal Medicine

## 2019-09-29 ENCOUNTER — Other Ambulatory Visit: Payer: Self-pay | Admitting: Internal Medicine

## 2019-09-29 DIAGNOSIS — E1169 Type 2 diabetes mellitus with other specified complication: Secondary | ICD-10-CM

## 2019-10-09 ENCOUNTER — Ambulatory Visit (INDEPENDENT_AMBULATORY_CARE_PROVIDER_SITE_OTHER): Payer: Medicare Other | Admitting: Internal Medicine

## 2019-10-09 ENCOUNTER — Other Ambulatory Visit: Payer: Self-pay

## 2019-10-09 ENCOUNTER — Encounter: Payer: Self-pay | Admitting: Internal Medicine

## 2019-10-09 VITALS — BP 124/66 | HR 80 | Temp 97.8°F | Resp 16 | Ht 64.0 in | Wt 139.6 lb

## 2019-10-09 DIAGNOSIS — N182 Chronic kidney disease, stage 2 (mild): Secondary | ICD-10-CM

## 2019-10-09 DIAGNOSIS — I10 Essential (primary) hypertension: Secondary | ICD-10-CM

## 2019-10-09 DIAGNOSIS — E1169 Type 2 diabetes mellitus with other specified complication: Secondary | ICD-10-CM | POA: Diagnosis not present

## 2019-10-09 DIAGNOSIS — E559 Vitamin D deficiency, unspecified: Secondary | ICD-10-CM | POA: Diagnosis not present

## 2019-10-09 DIAGNOSIS — Z79899 Other long term (current) drug therapy: Secondary | ICD-10-CM

## 2019-10-09 DIAGNOSIS — K219 Gastro-esophageal reflux disease without esophagitis: Secondary | ICD-10-CM

## 2019-10-09 DIAGNOSIS — E0822 Diabetes mellitus due to underlying condition with diabetic chronic kidney disease: Secondary | ICD-10-CM | POA: Diagnosis not present

## 2019-10-09 DIAGNOSIS — E785 Hyperlipidemia, unspecified: Secondary | ICD-10-CM

## 2019-10-09 NOTE — Progress Notes (Signed)
History of Present Illness:       This very nice 74 y.o.  DBF presents for 6 month follow up with HTN, HLD, T2_NIDDM and Vitamin D Deficiency.       Patient is treated for HTN & BP has been controlled at home. Today's BP is at goal - 124/66. Patient has had no complaints of any cardiac type chest pain, palpitations, dyspnea / orthopnea / PND, dizziness, claudication, or dependent edema.      Hyperlipidemia is controlled with diet & meds. Patient denies myalgias or other med SE's. Last Lipids were at goal:  Lab Results  Component Value Date   CHOL 147 07/03/2019   HDL 49 (L) 07/03/2019   LDLCALC 82 07/03/2019   TRIG 80 07/03/2019   CHOLHDL 3.0 07/03/2019    Also, the patient has history of Pre_DM (A1c 6.0% /2011) and then T2_NIDDM (A1c 7.4% /June 2016) and she 's had CKD3 (GFR 78) in  Mar 2021. She has had no symptoms of reactive hypoglycemia, diabetic polys, paresthesias or visual blurring.  Patient has lost  27# weight via better eating from 166# in Aug 2019 to current wt 139#.  She has tapered her Metformin to 1 tablet /Daily.  Last A1c was at goal:  Lab Results  Component Value Date   HGBA1C 5.7 (H) 07/03/2019            Further, the patient also has history of Vitamin D Deficiency and supplements vitamin D without any suspected side-effects. Last vitamin D was at goal:   Lab Results  Component Value Date   VD25OH 83 07/03/2019    Current Outpatient Medications on File Prior to Visit  Medication Sig  . aspirin 81 MG  tablet takedaily.  Marland Kitchen atorvastatin  80 MG tablet TAKE 1 TABLET  DAILY   . VITAMIN D  Take 10,000 units M,W,F and 5000 units all other days  . enalapril  20 MG tablet Take 1 tablet    Daily     for BP  . estradiol  1 MG tablet Take 1 tablet  Daily  . gabapentin  100 MG capsule Take 1 to 3 capsules 2 to 3 x/day as needed for Nerve Pain  . hctz 25 MG tablet TAKE 1 TABLET  DAILY   . ANUSOL-HC 25 MG suppository Place 1 suppository rectally 2  times daily   . MAGNESIUM  Take 500 mg  daily.   . metFORMIN- XR 500 MG 2- 1 tablet PO BID with largest meals.  . montelukast  10 MG tablet Take 1 tablet Daily for Allergies  . Multiple Vitamins-Minerals  Take 1 tablet  daily.  . Omega-3 FISH OIL Take daily.  Marland Kitchen omeprazole  20 MG capsule Take 1 capsule  daily.  . VESICARE 5 MG tablet TAKE 1 TABLET  DAILY FOR BLADDER    No Known Allergies  PMHx:   Past Medical History:  Diagnosis Date  . Allergy   . GERD (gastroesophageal reflux disease)   . Hyperlipidemia   . Hypertension   . Prediabetes   . Vitamin D deficiency     Immunization History  Administered Date(s) Administered  . Influenza Split 10/31/2012  . Influenza, High Dose Seasonal PF 09/13/2016, 11/08/2017  . Influenza-Unspecified 09/26/2014, 10/06/2015, 09/04/2018  . PFIZER SARS-COV-2 Vaccination 03/09/2019, 04/02/2019  . Pneumococcal Conjugate-13 03/09/2014  . Pneumococcal Polysaccharide-23 08/06/2015  . Td 08/09/2012  . Zoster 08/14/2012    Past Surgical History:  Procedure Laterality Date  .  ABDOMINAL HYSTERECTOMY Bilateral 1977   bso  . KNEE SURGERY Left 09/2009    FHx:    Reviewed / unchanged  SHx:    Reviewed / unchanged   Systems Review:  Constitutional: Denies fever, chills, wt changes, headaches, insomnia, fatigue, night sweats, change in appetite. Eyes: Denies redness, blurred vision, diplopia, discharge, itchy, watery eyes.  ENT: Denies discharge, congestion, post nasal drip, epistaxis, sore throat, earache, hearing loss, dental pain, tinnitus, vertigo, sinus pain, snoring.  CV: Denies chest pain, palpitations, irregular heartbeat, syncope, dyspnea, diaphoresis, orthopnea, PND, claudication or edema. Respiratory: denies cough, dyspnea, DOE, pleurisy, hoarseness, laryngitis, wheezing.  Gastrointestinal: Denies dysphagia, odynophagia, heartburn, reflux, water brash, abdominal pain or cramps, nausea, vomiting, bloating, diarrhea, constipation, hematemesis, melena,  hematochezia  or hemorrhoids. Genitourinary: Denies dysuria, frequency, urgency, nocturia, hesitancy, discharge, hematuria or flank pain. Musculoskeletal: Denies arthralgias, myalgias, stiffness, jt. swelling, pain, limping or strain/sprain.  Skin: Denies pruritus, rash, hives, warts, acne, eczema or change in skin lesion(s). Neuro: No weakness, tremor, incoordination, spasms, paresthesia or pain. Psychiatric: Denies confusion, memory loss or sensory loss. Endo: Denies change in weight, skin or hair change.  Heme/Lymph: No excessive bleeding, bruising or enlarged lymph nodes.  Physical Exam  BP 124/66   Pulse 80   Temp 97.8 F (36.6 C)   Resp 16   Ht 5\' 4"  (1.626 m)   Wt 139 lb 9.6 oz (63.3 kg)   BMI 23.96 kg/m   Appears  well nourished, well groomed  and in no distress.  Eyes: PERRLA, EOMs, conjunctiva no swelling or erythema. Sinuses: No frontal/maxillary tenderness ENT/Mouth: EAC's clear, TM's nl w/o erythema, bulging. Nares clear w/o erythema, swelling, exudates. Oropharynx clear without erythema or exudates. Oral hygiene is good. Tongue normal, non obstructing. Hearing intact.  Neck: Supple. Thyroid not palpable. Car 2+/2+ without bruits, nodes or JVD. Chest: Respirations nl with BS clear & equal w/o rales, rhonchi, wheezing or stridor.  Cor: Heart sounds normal w/ regular rate and rhythm without sig. murmurs, gallops, clicks or rubs. Peripheral pulses normal and equal  without edema.  Abdomen: Soft & bowel sounds normal. Non-tender w/o guarding, rebound, hernias, masses or organomegaly.  Lymphatics: Unremarkable.  Musculoskeletal: Full ROM all peripheral extremities, joint stability, 5/5 strength and normal gait.  Skin: Warm, dry without exposed rashes, lesions or ecchymosis apparent.  Neuro: Cranial nerves intact, reflexes equal bilaterally. Sensory-motor testing grossly intact. Tendon reflexes grossly intact.  Pysch: Alert & oriented x 3.  Insight and judgement nl &  appropriate. No ideations.  Assessment and Plan:  1. Essential hypertension  - Continue medication, monitor blood pressure at home.  - Continue DASH diet.  Reminder to go to the ER if any CP,  SOB, nausea, dizziness, severe HA, changes vision/speech.  - CBC with Differential/Platelet - COMPLETE METABOLIC PANEL WITH GFR - Magnesium - TSH  2. Hyperlipidemia associated with type 2 diabetes mellitus ([place])  - Continue diet/meds, exercise,& lifestyle modifications.  - Continue monitor periodic cholesterol/liver & renal functions   - Lipid panel - TSH  3. 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  - Monitor appropriate labs.  - Hemoglobin A1c - Insulin, random  4. Vitamin D deficiency  - Continue supplementation.  - VITAMIN D 25 Hydroxy   5. Gastroesophageal reflux disease  - CBC with Differential/Platelet  6. Medication management  - CBC with Differential/Platelet - COMPLETE METABOLIC PANEL  WITH GFR - Magnesium - Lipid panel - TSH - Hemoglobin A1c - Insulin, random - VITAMIN D 25 Hydroxy          Discussed  regular exercise, BP monitoring, weight control to achieve/maintain BMI less than 25 and discussed med and SE's. Recommended labs to assess and monitor clinical status with further disposition pending results of labs.  I discussed the assessment and treatment plan with the patient. The patient was provided an opportunity to ask questions and all were answered. The patient agreed with the plan and demonstrated an understanding of the instructions.  I provided over 30 minutes of exam, counseling, chart review and  complex critical decision making.   Kirtland Bouchard, MD

## 2019-10-09 NOTE — Patient Instructions (Signed)

## 2019-10-10 LAB — HEMOGLOBIN A1C
Hgb A1c MFr Bld: 5.6 % of total Hgb (ref <5.7)
Mean Plasma Glucose: 114 (calc)
eAG (mmol/L): 6.3 (calc)

## 2019-10-10 LAB — COMPLETE METABOLIC PANEL WITH GFR
AG Ratio: 1.5 (calc) (ref 1.0–2.5)
ALT: 24 U/L (ref 6–29)
AST: 26 U/L (ref 10–35)
Albumin: 4.2 g/dL (ref 3.6–5.1)
Alkaline phosphatase (APISO): 72 U/L (ref 37–153)
BUN: 15 mg/dL (ref 7–25)
CO2: 31 mmol/L (ref 20–32)
Calcium: 9.5 mg/dL (ref 8.6–10.4)
Chloride: 105 mmol/L (ref 98–110)
Creat: 0.8 mg/dL (ref 0.60–0.93)
GFR, Est African American: 84 mL/min/{1.73_m2} (ref >=60)
GFR, Est Non African American: 72 mL/min/{1.73_m2} (ref >=60)
Globulin: 2.8 g/dL (calc) (ref 1.9–3.7)
Glucose, Bld: 92 mg/dL (ref 65–99)
Potassium: 4.2 mmol/L (ref 3.5–5.3)
Sodium: 143 mmol/L (ref 135–146)
Total Bilirubin: 0.6 mg/dL (ref 0.2–1.2)
Total Protein: 7 g/dL (ref 6.1–8.1)

## 2019-10-10 LAB — CBC WITH DIFFERENTIAL/PLATELET
Absolute Monocytes: 512 cells/uL (ref 200–950)
Basophils Absolute: 42 cells/uL (ref 0–200)
Basophils Relative: 0.9 %
Eosinophils Absolute: 89 cells/uL (ref 15–500)
Eosinophils Relative: 1.9 %
HCT: 39.7 % (ref 35.0–45.0)
Hemoglobin: 13.1 g/dL (ref 11.7–15.5)
Lymphs Abs: 1476 cells/uL (ref 850–3900)
MCH: 28.7 pg (ref 27.0–33.0)
MCHC: 33 g/dL (ref 32.0–36.0)
MCV: 87.1 fL (ref 80.0–100.0)
MPV: 10.8 fL (ref 7.5–12.5)
Monocytes Relative: 10.9 %
Neutro Abs: 2580 cells/uL (ref 1500–7800)
Neutrophils Relative %: 54.9 %
Platelets: 242 10*3/uL (ref 140–400)
RBC: 4.56 10*6/uL (ref 3.80–5.10)
RDW: 14.3 % (ref 11.0–15.0)
Total Lymphocyte: 31.4 %
WBC: 4.7 10*3/uL (ref 3.8–10.8)

## 2019-10-10 LAB — INSULIN, RANDOM: Insulin: 6.9 u[IU]/mL

## 2019-10-10 LAB — LIPID PANEL
Cholesterol: 140 mg/dL (ref <200)
HDL: 42 mg/dL — ABNORMAL LOW (ref >=50)
LDL Cholesterol (Calc): 77 mg/dL (calc)
Non-HDL Cholesterol (Calc): 98 mg/dL (calc) (ref <130)
Total CHOL/HDL Ratio: 3.3 (calc) (ref <5.0)
Triglycerides: 120 mg/dL (ref <150)

## 2019-10-10 LAB — VITAMIN D 25 HYDROXY (VIT D DEFICIENCY, FRACTURES): Vit D, 25-Hydroxy: 88 ng/mL (ref 30–100)

## 2019-10-10 LAB — TSH: TSH: 1.29 mIU/L (ref 0.40–4.50)

## 2019-10-10 LAB — MAGNESIUM: Magnesium: 1.9 mg/dL (ref 1.5–2.5)

## 2019-10-10 NOTE — Progress Notes (Signed)
========================================================== -   Test results slightly outside the reference range are not unusual. If there is anything important, I will review this with you,  otherwise it is considered normal test values.  If you have further questions,  please do not hesitate to contact me at the office or via My Chart.  ==========================================================  -  Total Chol = 140 and LDL Chol = 77 - Both  Excellent   - Very low risk for Heart Attack  / Stroke ==========================================================  - A1c = 5.6% - Great  !  - Back in Normal , NonDiabetic Range !  $$$$$$$$$$$$$$$$$$$$$$$$$$$$$$$$$$$$$$$$$$$$$$$$$$$$$$$$$$$$$  - Recommend Stop the Metformin   $$$$$$$$$$$$$$$$$$$$$$$$$$$$$$$$$$$$$$$$$$$$$$$$$$$$$$$$$$$$$  -  Vitamin D = 88 - Excellent  ==========================================================  -  All Else - CBC - Kidneys - Electrolytes - Liver - Magnesium & Thyroid    - all  Normal / OK ==========================================================

## 2019-10-14 ENCOUNTER — Other Ambulatory Visit: Payer: Self-pay | Admitting: Internal Medicine

## 2019-10-14 DIAGNOSIS — Z1231 Encounter for screening mammogram for malignant neoplasm of breast: Secondary | ICD-10-CM

## 2019-11-17 ENCOUNTER — Ambulatory Visit: Payer: Medicare Other

## 2019-12-15 ENCOUNTER — Other Ambulatory Visit: Payer: Self-pay | Admitting: Internal Medicine

## 2019-12-15 ENCOUNTER — Other Ambulatory Visit: Payer: Self-pay | Admitting: Adult Health

## 2019-12-24 ENCOUNTER — Ambulatory Visit
Admission: RE | Admit: 2019-12-24 | Discharge: 2019-12-24 | Disposition: A | Discharge status: Home / Self Care | Payer: Medicare Other | Source: Ambulatory Visit | Attending: Internal Medicine | Admitting: Internal Medicine

## 2019-12-24 ENCOUNTER — Other Ambulatory Visit: Payer: Self-pay

## 2019-12-24 DIAGNOSIS — Z1231 Encounter for screening mammogram for malignant neoplasm of breast: Secondary | ICD-10-CM

## 2020-01-12 ENCOUNTER — Other Ambulatory Visit: Payer: Self-pay | Admitting: Internal Medicine

## 2020-01-15 ENCOUNTER — Ambulatory Visit (INDEPENDENT_AMBULATORY_CARE_PROVIDER_SITE_OTHER): Payer: Medicare Other | Admitting: Adult Health Nurse Practitioner

## 2020-01-15 ENCOUNTER — Encounter: Payer: Self-pay | Admitting: Adult Health Nurse Practitioner

## 2020-01-15 ENCOUNTER — Other Ambulatory Visit: Payer: Self-pay

## 2020-01-15 VITALS — BP 128/74 | HR 71 | Temp 97.5°F | Ht 64.0 in | Wt 147.0 lb

## 2020-01-15 DIAGNOSIS — Z79899 Other long term (current) drug therapy: Secondary | ICD-10-CM

## 2020-01-15 DIAGNOSIS — N3281 Overactive bladder: Secondary | ICD-10-CM

## 2020-01-15 DIAGNOSIS — E785 Hyperlipidemia, unspecified: Secondary | ICD-10-CM

## 2020-01-15 DIAGNOSIS — K219 Gastro-esophageal reflux disease without esophagitis: Secondary | ICD-10-CM

## 2020-01-15 DIAGNOSIS — E559 Vitamin D deficiency, unspecified: Secondary | ICD-10-CM

## 2020-01-15 DIAGNOSIS — N200 Calculus of kidney: Secondary | ICD-10-CM

## 2020-01-15 DIAGNOSIS — B001 Herpesviral vesicular dermatitis: Secondary | ICD-10-CM

## 2020-01-15 DIAGNOSIS — I10 Essential (primary) hypertension: Secondary | ICD-10-CM | POA: Diagnosis not present

## 2020-01-15 DIAGNOSIS — E1140 Type 2 diabetes mellitus with diabetic neuropathy, unspecified: Secondary | ICD-10-CM

## 2020-01-15 DIAGNOSIS — E1169 Type 2 diabetes mellitus with other specified complication: Secondary | ICD-10-CM

## 2020-01-15 DIAGNOSIS — H9311 Tinnitus, right ear: Secondary | ICD-10-CM

## 2020-01-15 NOTE — Patient Instructions (Addendum)
    Lipo-Flavonoid is a supplement you can help for ringing in th ears.    GENERAL HEALTH GOALS  Know what a healthy weight is for you (roughly BMI <25) and aim to maintain this  Aim for 7+ servings of fruits and vegetables daily  70-80+ fluid ounces of water or unsweet tea for healthy kidneys  Limit to max 1 drink of alcohol per day; avoid smoking/tobacco  Limit animal fats in diet for cholesterol and heart health - choose grass fed whenever available  Avoid highly processed foods, and foods high in saturated/trans fats  Aim for low stress - take time to unwind and care for your mental health  Aim for 150 min of moderate intensity exercise weekly for heart health, and weights twice weekly for bone health  Aim for 7-9 hours of sleep daily

## 2020-01-15 NOTE — Progress Notes (Signed)
FOLLOW UP 3 MONTH  Assessment:    Essential hypertension Continue medication: Enalapril 73m, HCTZ 253mMonitor blood pressure at home; call if consistently over 130/80 Continue DASH diet.   Reminder to go to the ER if any CP, SOB, nausea, dizziness, severe HA, changes vision/speech, left arm numbness and tingling and jaw pain.  Gastroesophageal reflux disease, esophagitis presence not specified Well managed on current medications Discussed diet, avoiding triggers and other lifestyle changes WORSE FLARE- WILL CHECK MF BACK TO ER AVOID NSAIDS NEG MURPHY'S, NO AB PAIN- FOLLOW UP IF NOT BETTER  OAB (overactive bladder) Symptoms improved with vesicare, uses one liner per day  Nephrolithiasis Increase fluids; followed by Dr. MaMatilde SprangVitamin D deficiency Near goal at recent check; continue to recommend supplementation for goal of 70-100 Defer vitamin D level  T2DM Diet controlled, no longer taking medications. Education: Reviewed 'ABCs' of diabetes management (respective goals in parentheses):  A1C (<7), blood pressure (<130/80), and cholesterol (LDL <70) Eye Exam yearly and Dental Exam every 6 months. UTD - requested reports Dietary recommendations Physical Activity recommendations Check feet daily; no concerns; foot exam UTD   Medication management CBC, CMP/GFR, magnesium   Mixed hyperlipidemia Continue medications: atorvastatin Discussed LDL goal <70 Continue low cholesterol diet and exercise.  Check lipid panel.   Overweight (BMI 25.0-29.9) Long discussion about weight loss, diet, and exercise Recommended diet heavy in fruits and veggies and low in animal meats, cheeses, and dairy products, appropriate calorie intake Discussed appropriate weight for height and initial goal (163 lb) Follow up at next visit  Recurrent cold sores Acyclovir PRN  Tinnitus, right Discussed sound machine Lipo-Flabvonoid   Over 40 minutes of face to face interview, exam,  counseling, chart review and critical decision making was performed Future Appointments  Date Time Provider [place]4/07/2020  9:00 AM CoLiane ComberNP [facility] None      Subjective:  Kaitlyn Townsend a [age].o. female who presents for 3 month follow up.   She reports overall she is doing well.  She has not health or medication concerns today.  [facility]. Vesicare for OAB is working well for her.  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   She was on topamax for residual pain from previous fracture of left femur 2011 and was doing well with this but tapered off recently and doing well.   She is on hormone replace estrace 1 mg daily following hysterectomy and doing well with this. She is on bASA and she is active. Willing to try 1/2 a day.   BMI is Body mass index is 25.23 kg/m., she has been working on diet and exercise. She is on stationary bike 15-20 miles 5 days a week. Weight is down from 168-170 lb.  Wt Readings from Last 3 Encounters:  01/15/20 147 lb (66.7 kg)  10/09/19 139 lb 9.6 oz (63.3 kg)  08/19/19 140 lb (63.5 kg)    Her blood pressure has been controlled at home, today their BP is BP: 128/74 She does workout. She denies chest pain, shortness of breath, dizziness.   She is on cholesterol medication (atorvastatin 40 mg daily) and denies myalgias. Her LDL cholesterol is not at goal of LDL <70 at last check. The cholesterol last visit was:   Lab Results  Component Value Date   CHOL 140 10/09/2019   HDL 42 (L) 10/09/2019   LDLCALC 77 10/09/2019  TRIG 120 10/09/2019   CHOLHDL 3.3 10/09/2019    She has been working on diet and exercise for T2DM on metformin 500 mg twice daily- was on ER but switched to regular, has been having worsening GERD, no NSAIDS, no ETOH- has been on omeprazole and denies foot ulcerations, increased appetite, nausea, paresthesia of the feet, polydipsia,  polyuria, visual disturbances, vomiting and weight loss. She does check fasting sugars, range (84-108). Last A1C in the office was:  Lab Results  Component Value Date   HGBA1C 5.6 10/09/2019   Last GFR: Lab Results  Component Value Date   GFRNONAA 72 10/09/2019   Patient is on Vitamin D supplement and approaching goal at recent check:    Lab Results  Component Value Date   VD25OH 88 10/09/2019      Medication Review:  Current Outpatient Medications (Endocrine & Metabolic):  .  estradiol (ESTRACE) 1 MG tablet, Take 1 tablet by mouth once daily  Current Outpatient Medications (Cardiovascular):  .  atorvastatin (LIPITOR) 80 MG tablet, TAKE 1 TABLET BY MOUTH ONCE DAILY FOR CHOLESTEROL .  enalapril (VASOTEC) 20 MG tablet, Take 1 tablet by mouth once daily for blood pressure .  hydrochlorothiazide (HYDRODIURIL) 25 MG tablet, TAKE 1 TABLET BY MOUTH ONCE DAILY FOR BLOOD PRESSURE AND  FLUID  RETENTION  (ANKLE  SWELLING)  Current Outpatient Medications (Respiratory):  .  montelukast (SINGULAIR) 10 MG tablet, Take 1 tablet Daily for Allergies  Current Outpatient Medications (Analgesics):  .  aspirin 81 MG chewable tablet, Chew by mouth every other day.   Current Outpatient Medications (Other):  .  blood glucose meter kit and supplies, Test sugars as directed by provider. Dispense based insurance preference. E11.22 .  Blood Glucose Monitoring Suppl (ACCU-CHEK AVIVA PLUS) w/Device KIT, Check blood sugar 1 time daily-DX-R73.09 .  Cholecalciferol (VITAMIN D PO), Take 5,000 Int'l Units by mouth daily. Take 10,000 units M,W,F and 5000 units all other days .  glucose blood (ACCU-CHEK AVIVA PLUS) test strip, USE STRIP TO CHECK BLOOD SUGAR ONCE DAILY .  hydrocortisone (ANUSOL-HC) 25 MG suppository, Place 1 suppository (25 mg total) rectally 2 (two) times daily. For 7 days .  Lancets (ACCU-CHEK SOFT TOUCH) lancets, Check blood sugar 1 time daily-DX-R73.09 .  MAGNESIUM PO, Take 500 mg by mouth  daily.  .  Multiple Vitamins-Minerals (MULTIVITAMIN WITH MINERALS) tablet, Take 1 tablet by mouth daily. .  Omega-3 Fatty Acids (FISH OIL PO), Take by mouth daily. Marland Kitchen  omeprazole (PRILOSEC) 20 MG capsule, Take 1 capsule (20 mg total) by mouth daily. .  solifenacin (VESICARE) 5 MG tablet, TAKE 1 TABLET BY MOUTH ONCE DAILY FOR BLADDER  No Known Allergies  Current Problems (verified) Patient Active Problem List   Diagnosis Date Noted  . 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 Former smoker 08/21/2017  . FHx: heart disease 08/21/2017  . Recurrent cold sores 05/14/2017  . Nephrolithiasis 02/16/2016  . OAB (overactive bladder) 02/16/2016  . Medication management 02/13/2013  . Hyperlipidemia associated with type 2 diabetes mellitus ([place])   . Essential hypertension   . Type 2 diabetes mellitus with hyperlipidemia ([place])   . Vitamin D deficiency   . GERD (gastroesophageal reflux disease)     Screening Tests Immunization History  Administered Date(s) Administered  . Influenza Split 10/31/2012  . Influenza, High Dose Seasonal PF 09/13/2016, 11/08/2017  .  Influenza-Unspecified 09/26/2014, 10/06/2015, 09/04/2018  . PFIZER SARS-COV-2 Vaccination 03/09/2019, 04/02/2019  . Pneumococcal Conjugate-13 03/09/2014  . Pneumococcal Polysaccharide-23 08/06/2015  . Td 08/09/2012  . Zoster 08/14/2012   Health Maintenance  Topic Date Due  . INFLUENZA VACCINE  08/17/2019  . COVID-19 Vaccine (3 - Booster for Pfizer series) 10/03/2019  . OPHTHALMOLOGY EXAM  11/21/2019  . COLONOSCOPY (Pts 45-34yrs Insurance coverage will need to be confirmed)  02/02/2020  . FOOT EXAM  03/24/2020  . HEMOGLOBIN A1C  04/07/2020  . TETANUS/TDAP  08/10/2022  . DEXA SCAN  Completed  . Hepatitis C Screening  Completed  . PNA vac Low Risk Adult  Completed     Preventative care: Last colonoscopy: 2012 Mammogram:12/2019 Last pap smear/pelvic exam: remote; s/p total hysterectomy DEXA: 2019, normal   Names of Other Physician/Practitioners you currently use: 1.  [facility] here for primary care 2. [facility], eye doctor, last visit 2020 3. Dr. Deatra Ina, dentist, last visit 2019  Patient Care Team: Unk Pinto, MD as PCP - General (Internal Medicine) Inda Castle, MD (Inactive) as Consulting Physician (Gastroenterology) Bjorn Loser, MD as Consulting Physician (Urology)  SURGICAL HISTORY She  has a past surgical history that includes Knee surgery (Left, 09/2009) and Abdominal hysterectomy (Bilateral, 1977). FAMILY HISTORY Her family history includes Heart disease in her brother and mother; Hypertension in her brother; Kidney disease in her brother; Ulcers in her father. SOCIAL HISTORY She  reports that she quit smoking about 26 years ago. Her smoking use included cigarettes. She has never used smokeless tobacco. She reports current alcohol use.    Review of Systems  Constitutional: Negative for malaise/fatigue and weight loss.  HENT: Negative for hearing loss and tinnitus.   Eyes: Negative for blurred vision and double vision.  Respiratory: Negative for cough, sputum production, shortness of breath and wheezing.   Cardiovascular: Negative for chest pain, palpitations, orthopnea, claudication, leg swelling and PND.  Gastrointestinal: Negative for abdominal pain, blood in stool, constipation, diarrhea, heartburn, melena, nausea and vomiting.  Genitourinary: Negative.   Musculoskeletal: Negative for falls, joint pain and myalgias.  Skin: Negative for rash.  Neurological: Negative for dizziness, tingling, sensory change, weakness and headaches.  Endo/Heme/Allergies: Negative for polydipsia.  Psychiatric/Behavioral: Negative.  Negative for depression, memory loss, substance abuse and  suicidal ideas. The patient is not nervous/anxious and does not have insomnia.   All other systems reviewed and are negative.    Objective:     Today's Vitals   01/15/20 0925  BP: 128/74  Pulse: 71  Temp: (!) 97.5 F (36.4 C)  SpO2: 98%  Weight: 147 lb (66.7 kg)  Height: $Remove'5\' 4"'gNDwAVD$  (1.626 m)   Body mass index is 25.23 kg/m.  General appearance: alert, no distress, WD/WN, female HEENT: normocephalic, sclerae anicteric, TMs pearly, nares patent, no discharge or erythema, pharynx normal Oral cavity: MMM, no lesions Neck: supple, no lymphadenopathy, no thyromegaly, no masses Heart: RRR, normal S1, S2, no murmurs Lungs: CTA bilaterally, no wheezes, rhonchi, or rales Abdomen: +bs, soft, non tender, non distended, no masses, no hepatomegaly, no splenomegaly Musculoskeletal: nontender, no swelling, no obvious deformity Extremities: no edema, no cyanosis, no clubbing Pulses: 2+ symmetric, upper and lower extremities, normal cap refill Neurological: alert, oriented x 3, CN2-12 intact, strength normal upper extremities and lower extremities, sensation normal throughout, DTRs 2+ throughout, no cerebellar signs, gait normal Psychiatric: normal affect, behavior normal, pleasant   Medicare Attestation I have personally reviewed: The patient's medical and social history Their  use of alcohol, tobacco or illicit drugs Their current medications and supplements The patient's functional ability including ADLs,fall risks, home safety risks, cognitive, and hearing and visual impairment Diet and physical activities Evidence for depression or mood disorders  The patient's weight, height, BMI, and visual acuity have been recorded in the chart.  I have made referrals, counseling, and provided education to the patient based on review of the above and I have provided the patient with a written personalized care plan for preventive services.     Garnet Sierras, NP   01/15/2020

## 2020-01-16 LAB — CBC WITH DIFFERENTIAL/PLATELET
Absolute Monocytes: 563 cells/uL (ref 200–950)
Basophils Absolute: 50 cells/uL (ref 0–200)
Basophils Relative: 1.1 %
Eosinophils Absolute: 113 cells/uL (ref 15–500)
Eosinophils Relative: 2.5 %
HCT: 38.6 % (ref 35.0–45.0)
Hemoglobin: 13 g/dL (ref 11.7–15.5)
Lymphs Abs: 1557 cells/uL (ref 850–3900)
MCH: 29.1 pg (ref 27.0–33.0)
MCHC: 33.7 g/dL (ref 32.0–36.0)
MCV: 86.5 fL (ref 80.0–100.0)
MPV: 10.6 fL (ref 7.5–12.5)
Monocytes Relative: 12.5 %
Neutro Abs: 2219 cells/uL (ref 1500–7800)
Neutrophils Relative %: 49.3 %
Platelets: 233 10*3/uL (ref 140–400)
RBC: 4.46 10*6/uL (ref 3.80–5.10)
RDW: 13.3 % (ref 11.0–15.0)
Total Lymphocyte: 34.6 %
WBC: 4.5 10*3/uL (ref 3.8–10.8)

## 2020-01-16 LAB — LIPID PANEL
Cholesterol: 159 mg/dL (ref <200)
HDL: 51 mg/dL (ref >=50)
LDL Cholesterol (Calc): 88 mg/dL (calc)
Non-HDL Cholesterol (Calc): 108 mg/dL (calc) (ref <130)
Total CHOL/HDL Ratio: 3.1 (calc) (ref <5.0)
Triglycerides: 108 mg/dL (ref <150)

## 2020-01-16 LAB — HEMOGLOBIN A1C
Hgb A1c MFr Bld: 6.1 % of total Hgb — ABNORMAL HIGH (ref <5.7)
Mean Plasma Glucose: 128 mg/dL
eAG (mmol/L): 7.1 mmol/L

## 2020-01-16 LAB — COMPLETE METABOLIC PANEL WITH GFR
AG Ratio: 1.5 (calc) (ref 1.0–2.5)
ALT: 24 U/L (ref 6–29)
AST: 23 U/L (ref 10–35)
Albumin: 4.1 g/dL (ref 3.6–5.1)
Alkaline phosphatase (APISO): 62 U/L (ref 37–153)
BUN: 14 mg/dL (ref 7–25)
CO2: 31 mmol/L (ref 20–32)
Calcium: 9.8 mg/dL (ref 8.6–10.4)
Chloride: 104 mmol/L (ref 98–110)
Creat: 0.79 mg/dL (ref 0.60–0.93)
GFR, Est African American: 85 mL/min/{1.73_m2} (ref >=60)
GFR, Est Non African American: 73 mL/min/{1.73_m2} (ref >=60)
Globulin: 2.7 g/dL (calc) (ref 1.9–3.7)
Glucose, Bld: 112 mg/dL — ABNORMAL HIGH (ref 65–99)
Potassium: 3.9 mmol/L (ref 3.5–5.3)
Sodium: 141 mmol/L (ref 135–146)
Total Bilirubin: 0.7 mg/dL (ref 0.2–1.2)
Total Protein: 6.8 g/dL (ref 6.1–8.1)

## 2020-02-16 ENCOUNTER — Other Ambulatory Visit: Payer: Self-pay | Admitting: Internal Medicine

## 2020-02-16 DIAGNOSIS — J301 Allergic rhinitis due to pollen: Secondary | ICD-10-CM

## 2020-03-16 ENCOUNTER — Other Ambulatory Visit: Payer: Self-pay | Admitting: Adult Health Nurse Practitioner

## 2020-03-29 ENCOUNTER — Other Ambulatory Visit: Payer: Self-pay | Admitting: Adult Health

## 2020-03-29 DIAGNOSIS — E1169 Type 2 diabetes mellitus with other specified complication: Secondary | ICD-10-CM

## 2020-04-05 ENCOUNTER — Encounter: Payer: Medicare Other | Admitting: Internal Medicine

## 2020-04-21 NOTE — Progress Notes (Signed)
CPE AND FOLLOW UP  Assessment:    Encounter for Annual wellness exam 1 year Check with insurance about shingrix  Essential hypertension Continue medication Monitor blood pressure at home; call if consistently over 130/80 Continue DASH diet.   Reminder to go to the ER if any CP, SOB, nausea, dizziness, severe HA, changes vision/speech, left arm numbness and tingling and jaw pain.  Gastroesophageal reflux disease, esophagitis presence not specified Well managed on current medications Discussed diet, avoiding triggers and other lifestyle changes  OAB (overactive bladder) Symptoms improved with vesicare, uses one liner per day  Nephrolithiasis Increase fluids; followed by Dr. Matilde Sprang  Vitamin D deficiency Near goal at recent check; continue to recommend supplementation for goal of 70-100 Defer vitamin D level  T2DM Education: Reviewed 'ABCs' of diabetes management (respective goals in parentheses):  A1C (<7), blood pressure (<130/80), and cholesterol (LDL <70) Eye Exam yearly and Dental Exam every 6 months. UTD - requested reports Dietary recommendations Physical Activity recommendations Check feet daily; no concerns; foot exam done - A1C, UA, microalbumin   Medication management CBC, CMP/GFR, magnesium   Mixed hyperlipidemia Continue medications: atorvastatin Discussed LDL goal <70 Continue low cholesterol diet and exercise.  Check lipid panel.   Overweight (BMI 25.0-29.9) Long discussion about weight loss, diet, and exercise Recommended diet heavy in fruits and veggies and low in animal meats, cheeses, and dairy products, appropriate calorie intake Discussed appropriate weight for height and initial goal (163 lb) Follow up at next visit  Recurrent cold sores Acyclovir PRN   Orders Placed This Encounter  Procedures  . CBC with Differential/Platelet  . COMPLETE METABOLIC PANEL WITH GFR  . Magnesium  . Lipid panel  . TSH  . Hemoglobin A1c  . VITAMIN D  25 Hydroxy (Vit-D Deficiency, Fractures)  . Vitamin B12  . Microalbumin / creatinine urine ratio  . Urinalysis, Routine w reflex microscopic  . Ambulatory referral to Gastroenterology  . EKG 12-Lead  . HM DIABETES FOOT EXAM    Over 40 minutes of exam, counseling, chart review and critical decision making was performed Future Appointments  Date Time Provider Kaitlyn Townsend  04/28/2021 10:00 AM Unk Pinto, MD [facility] None     Plan:   During the course of the visit the patient was educated and counseled about appropriate screening and preventive services including:    Pneumococcal vaccine   Prevnar 13  Influenza vaccine  Td vaccine  Screening electrocardiogram  Bone densitometry screening  Colorectal cancer screening  Diabetes screening  Glaucoma screening  Nutrition counseling   Advanced directives: requested   Subjective:  Kaitlyn Townsend is a 75 y.o. female who presents for CPE. 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   She is married, 1 son, no grandchildren. 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 No concerns today.   She is followed by Dr. Matilde Sprang at [facility] for recurrent nephrolithiasis. Vesicare for OAB is working well for her.  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   She is on estrace 1 mg daily following total hysterectomy and doing well with this. She is on bASA and she is active. She  had significant hot flashes with attempt to taper  continues with 1 mg daily.   She has GERD on omeprazole 20 mg daily.   BMI is Body mass index is 25.4 kg/m., she has been working on diet and exercise. She was on stationary bike and doing well but admitted reduced, plants to increase back. Weight is down from 168-170 lb.  Wt Readings from Last 3 Encounters:  04/22/20 148 lb (67.1 kg)  01/15/20 147 lb (66.7 kg)  10/09/19 139 lb 9.6 oz (63.3 kg)    Her blood pressure has been controlled at home, today their BP is BP: 118/64 She does workout. She denies chest pain, shortness of breath, dizziness.   She is on cholesterol medication (atorvastatin 80 mg daily) and denies myalgias. Her LDL cholesterol is not at goal of LDL <70 at last check. The cholesterol last visit was:   Lab Results  Component Value Date   CHOL 159 01/15/2020   HDL 51 01/15/2020   LDLCALC 88 01/15/2020   TRIG 108 01/15/2020   CHOLHDL 3.1 01/15/2020    She has been working on diet and exercise for T2DM on metformin 500 mg daily- was on ER but switched to regular, has been having worsening GERD, no NSAIDS, no ETOH- has been on omeprazole and denies foot ulcerations, increased appetite, nausea, paresthesia of the feet, polydipsia, polyuria, visual disturbances, vomiting and weight loss. She does check fasting sugars, range (84-108). Last A1C in the office was:  Lab Results  Component Value Date   HGBA1C 6.1 (H) 01/15/2020   Last GFR: Lab Results  Component Value Date   GFRNONAA 73 01/15/2020   Patient is on Vitamin D supplement and approaching goal at recent check, alternates 5000 IU and 10000 IU:    Lab Results  Component Value Date   VD25OH 88 10/09/2019       Medication Review:  Current Outpatient Medications (Endocrine & Metabolic):  .  estradiol (ESTRACE) 1 MG tablet, Take 1 tablet by mouth once daily .  metFORMIN (GLUCOPHAGE XR) 500 MG 24 hr tablet, 1 tablet PO Daily with largest meals.  Current Outpatient Medications (Cardiovascular):  .  atorvastatin  (LIPITOR) 80 MG tablet, TAKE 1 TABLET BY MOUTH ONCE DAILY FOR CHOLESTEROL .  enalapril (VASOTEC) 20 MG tablet, Take 1 tablet by mouth once daily for blood pressure .  hydrochlorothiazide (HYDRODIURIL) 25 MG tablet, TAKE 1 TABLET BY MOUTH ONCE DAILY FOR  BLOOD  PRESSURE  AND  FLUID  RETENTION  (ANKLE  SWELLING)  Current Outpatient Medications (Respiratory):  .  montelukast (SINGULAIR) 10 MG tablet, Take  1 tablet  Daily  for Allergies  Current Outpatient Medications (Analgesics):  .  aspirin 81 MG chewable tablet, Chew by mouth every other day.   Current Outpatient Medications (Other):  .  blood glucose meter kit and supplies, Test sugars as directed by provider. Dispense based insurance preference. E11.22 .  Blood Glucose Monitoring Suppl (ACCU-CHEK AVIVA PLUS) w/Device KIT, Check blood sugar 1 time daily-DX-R73.09 .  Cholecalciferol (VITAMIN D PO), Take 5,000 Int'l Units by mouth daily. Take 10,000 units M,W,F and 5000 units all other days .  glucose blood (ACCU-CHEK AVIVA PLUS) test strip, USE STRIP TO CHECK BLOOD SUGAR ONCE DAILY .  hydrocortisone (ANUSOL-HC) 25 MG suppository, Place 1 suppository (25 mg total) rectally 2 (two) times daily. For 7 days .  Lancets (ACCU-CHEK SOFT TOUCH) lancets, Check blood sugar 1 time daily-DX-R73.09 .  MAGNESIUM PO, Take 500 mg by mouth daily.  Marland Kitchen  Multiple Vitamins-Minerals (MULTIVITAMIN WITH MINERALS) tablet, Take 1 tablet by mouth daily. .  Omega-3 Fatty Acids (FISH OIL PO), Take by mouth daily. Marland Kitchen  omeprazole (PRILOSEC) 20 MG capsule, Take 1 capsule (20 mg total) by mouth daily. .  solifenacin (VESICARE) 5 MG tablet, TAKE 1 TABLET BY MOUTH ONCE DAILY FOR BLADDER  No Known Allergies  Current Problems (verified) Patient Active Problem List   Diagnosis Date Noted  . 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 Former smoker 08/21/2017  . FHx: heart disease 08/21/2017  . Recurrent cold sores 05/14/2017  . Nephrolithiasis 02/16/2016  . OAB (overactive bladder) 02/16/2016  . Medication management 02/13/2013  . Hyperlipidemia associated with type 2 diabetes mellitus (Kaitlyn Townsend)   . Essential hypertension   . Type 2 diabetes mellitus with hyperlipidemia (Kaitlyn Townsend)   . Vitamin D deficiency   . GERD (gastroesophageal reflux disease)     Screening Tests Immunization History  Administered Date(s) Administered  . Influenza Split 10/31/2012  . Influenza, High Dose Seasonal PF 09/13/2016, 11/08/2017  . Influenza-Unspecified 09/26/2014, 10/06/2015, 09/04/2018  . PFIZER(Purple Top)SARS-COV-2 Vaccination 03/09/2019, 04/02/2019, 10/29/2019  . Pneumococcal Conjugate-13 03/09/2014  . Pneumococcal Polysaccharide-23 08/06/2015  . Td 08/09/2012  . Zoster 08/14/2012   Tetanus: 2014 Influenza: 10/2019 Pneumonia 2/2 Shingles: zoster in 2014 Covid 19: 3/3, 2021, pfizer   Preventative care: Last colonoscopy: 2012 Dr. Deatra Ina, DUE Mammogram:12/2019 Last pap smear/pelvic exam: remote; s/p total hysterectomy DEXA: 2019, normal   Names of Other Physician/Practitioners you currently use: 1. [facility] here for primary care 2. [facility], Dr, Mariea Stable, eye doctor, last visit 12/6/202 - report requested 3. Dr. Deatra Ina, dentist, last visit 2021, q13m Patient Care Team: MUnk Pinto MD as PCP - General (Internal Medicine) KInda Castle MD (Inactive) as Consulting Physician (Gastroenterology) MBjorn Loser MD as Consulting Physician (Urology)  SURGICAL HISTORY She  has a past surgical history that includes Knee surgery (Left, 09/2009) and Abdominal hysterectomy (Bilateral, 1977). FAMILY HISTORY Her family history includes Heart disease in her brother and mother; Hypertension in her brother; Kidney disease in her brother; Stroke in  her maternal grandmother; Ulcers in her father. SOCIAL HISTORY She  reports that she quit smoking about 26 years ago. Her smoking use included cigarettes. She started smoking about 50 years ago. She has a 25.50 pack-year smoking history. She has never used smokeless tobacco. She reports current alcohol use. She reports that she does not use drugs.    Review of Systems  Constitutional: Negative for malaise/fatigue and weight loss.  HENT: Negative for hearing loss and tinnitus.   Eyes: Negative for blurred vision and double vision.  Respiratory: Negative for cough, sputum production, shortness of breath and wheezing.   Cardiovascular: Negative for chest pain, palpitations, orthopnea, claudication, leg swelling and PND.  Gastrointestinal: Negative for abdominal pain, blood in stool, constipation, diarrhea, heartburn, melena, nausea and vomiting.  Genitourinary: Negative.   Musculoskeletal: Negative for falls, joint pain and myalgias.  Skin: Negative for rash.  Neurological: Negative for dizziness, tingling, sensory change, weakness and headaches.  Endo/Heme/Allergies: Negative for polydipsia.  Psychiatric/Behavioral: Negative.  Negative for depression, memory loss, substance abuse and suicidal ideas. The patient is not nervous/anxious and does not have insomnia.   All other systems reviewed and are negative.    Objective:     Today's  Vitals   04/22/20 0852  BP: 118/64  Temp: (!) 96.8 F (36 C)  Weight: 148 lb (67.1 kg)  Height: _0  (1.626 m)   Body mass index is 25.4 kg/m.  General appearance: alert, no distress, WD/WN, female HEENT: normocephalic, sclerae anicteric, TMs pearly, nares patent, no discharge or erythema, pharynx normal Oral cavity: MMM, no lesions Neck: supple, no lymphadenopathy, no thyromegaly, no masses Heart: RRR, normal S1, S2, no murmurs Lungs: CTA bilaterally, no wheezes, rhonchi, or rales Abdomen: +bs, soft, non tender, non distended, no masses, no  hepatomegaly, no splenomegaly Musculoskeletal: nontender, no swelling, no obvious deformity Extremities: no edema, no cyanosis, no clubbing Pulses: 2+ symmetric, upper and lower extremities, normal cap refill Neurological: alert, oriented x 3, CN2-12 intact, strength normal upper extremities and lower extremities, sensation normal throughout, DTRs 2+ throughout, no cerebellar signs, gait normal Psychiatric: normal affect, behavior normal, pleasant  GU: defer, no concerns Breasts: Just had normal mammogram, doing self exams, no concerns, defer   EKG: NSR, NSCPT  Izora Ribas, NP   04/22/2020

## 2020-04-22 ENCOUNTER — Other Ambulatory Visit: Payer: Self-pay

## 2020-04-22 ENCOUNTER — Encounter: Payer: Self-pay | Admitting: Adult Health

## 2020-04-22 ENCOUNTER — Ambulatory Visit (INDEPENDENT_AMBULATORY_CARE_PROVIDER_SITE_OTHER): Payer: Medicare Other | Admitting: Adult Health

## 2020-04-22 VITALS — BP 118/64 | Temp 96.8°F | Ht 64.0 in | Wt 148.0 lb

## 2020-04-22 DIAGNOSIS — E0822 Diabetes mellitus due to underlying condition with diabetic chronic kidney disease: Secondary | ICD-10-CM

## 2020-04-22 DIAGNOSIS — E1169 Type 2 diabetes mellitus with other specified complication: Secondary | ICD-10-CM

## 2020-04-22 DIAGNOSIS — I10 Essential (primary) hypertension: Secondary | ICD-10-CM | POA: Diagnosis not present

## 2020-04-22 DIAGNOSIS — Z8249 Family history of ischemic heart disease and other diseases of the circulatory system: Secondary | ICD-10-CM | POA: Diagnosis not present

## 2020-04-22 DIAGNOSIS — Z136 Encounter for screening for cardiovascular disorders: Secondary | ICD-10-CM

## 2020-04-22 DIAGNOSIS — Z1211 Encounter for screening for malignant neoplasm of colon: Secondary | ICD-10-CM

## 2020-04-22 DIAGNOSIS — K2100 Gastro-esophageal reflux disease with esophagitis, without bleeding: Secondary | ICD-10-CM

## 2020-04-22 DIAGNOSIS — E785 Hyperlipidemia, unspecified: Secondary | ICD-10-CM

## 2020-04-22 DIAGNOSIS — Z0000 Encounter for general adult medical examination without abnormal findings: Secondary | ICD-10-CM | POA: Diagnosis not present

## 2020-04-22 DIAGNOSIS — Z6825 Body mass index (BMI) 25.0-25.9, adult: Secondary | ICD-10-CM

## 2020-04-22 DIAGNOSIS — R8290 Unspecified abnormal findings in urine: Secondary | ICD-10-CM

## 2020-04-22 DIAGNOSIS — Z79899 Other long term (current) drug therapy: Secondary | ICD-10-CM

## 2020-04-22 DIAGNOSIS — E1140 Type 2 diabetes mellitus with diabetic neuropathy, unspecified: Secondary | ICD-10-CM

## 2020-04-22 DIAGNOSIS — Z87891 Personal history of nicotine dependence: Secondary | ICD-10-CM

## 2020-04-22 DIAGNOSIS — G629 Polyneuropathy, unspecified: Secondary | ICD-10-CM

## 2020-04-22 DIAGNOSIS — K219 Gastro-esophageal reflux disease without esophagitis: Secondary | ICD-10-CM

## 2020-04-22 DIAGNOSIS — N3281 Overactive bladder: Secondary | ICD-10-CM

## 2020-04-22 DIAGNOSIS — E559 Vitamin D deficiency, unspecified: Secondary | ICD-10-CM

## 2020-04-22 DIAGNOSIS — N200 Calculus of kidney: Secondary | ICD-10-CM

## 2020-04-22 MED ORDER — METFORMIN HCL ER 500 MG PO TB24: ORAL_TABLET | ORAL | 3 refills | Status: DC

## 2020-04-22 NOTE — Patient Instructions (Addendum)
   Kaitlyn Townsend , Thank you for taking time to come for your Annual Wellness Visit. I appreciate your ongoing commitment to your health goals. Please review the following plan we discussed and let me know if I can assist you in the future.   These are the goals we discussed: Goals    . HEMOGLOBIN A1C < 5.7    . LDL CALC < 70       This is a list of the screening recommended for you and due dates:  Health Maintenance  Topic Date Due  . Eye exam for diabetics  11/21/2019  . Colon Cancer Screening  02/02/2020  . Complete foot exam   03/24/2020  . Hemoglobin A1C  07/15/2020  . Flu Shot  08/16/2020  . Tetanus Vaccine  08/10/2022  . DEXA scan (bone density measurement)  Completed  . COVID-19 Vaccine  Completed  .  Hepatitis C: One time screening is recommended by Center for Disease Control  (CDC) for  adults born from [birth year] through 1966.   Completed  . Pneumonia vaccines  Completed  . HPV Vaccine  Aged Out    If interested - check with insurance about shingrix - if they cover can get at [facility] or [facility]    Know what a healthy weight is for you (roughly BMI <25) and aim to maintain this  Aim for 7+ servings of fruits and vegetables daily  65-80+ fluid ounces of water or unsweet tea for healthy kidneys  Limit to max 1 drink of alcohol per day; avoid smoking/tobacco  Limit animal fats in diet for cholesterol and heart health - choose grass fed whenever available  Avoid highly processed foods, and foods high in saturated/trans fats  Aim for low stress - take time to unwind and care for your mental health  Aim for 150 min of moderate intensity exercise weekly for heart health, and weights twice weekly for bone health  Aim for 7-9 hours of sleep daily    A great goal to work towards is aiming to get in a serving daily of some of the most nutritionally dense foods - G- BOMBS daily

## 2020-04-23 LAB — CBC WITH DIFFERENTIAL/PLATELET
Absolute Monocytes: 483 cells/uL (ref 200–950)
Basophils Absolute: 51 cells/uL (ref 0–200)
Basophils Relative: 1.1 %
Eosinophils Absolute: 69 cells/uL (ref 15–500)
Eosinophils Relative: 1.5 %
HCT: 40.5 % (ref 35.0–45.0)
Hemoglobin: 13.1 g/dL (ref 11.7–15.5)
Lymphs Abs: 1670 cells/uL (ref 850–3900)
MCH: 27.6 pg (ref 27.0–33.0)
MCHC: 32.3 g/dL (ref 32.0–36.0)
MCV: 85.3 fL (ref 80.0–100.0)
MPV: 10.9 fL (ref 7.5–12.5)
Monocytes Relative: 10.5 %
Neutro Abs: 2328 cells/uL (ref 1500–7800)
Neutrophils Relative %: 50.6 %
Platelets: 256 10*3/uL (ref 140–400)
RBC: 4.75 10*6/uL (ref 3.80–5.10)
RDW: 13.8 % (ref 11.0–15.0)
Total Lymphocyte: 36.3 %
WBC: 4.6 10*3/uL (ref 3.8–10.8)

## 2020-04-23 LAB — VITAMIN B12: Vitamin B-12: 1410 pg/mL — ABNORMAL HIGH (ref 200–1100)

## 2020-04-23 LAB — URINALYSIS, ROUTINE W REFLEX MICROSCOPIC
Bilirubin Urine: NEGATIVE
Glucose, UA: NEGATIVE
Hgb urine dipstick: NEGATIVE
Hyaline Cast: NONE SEEN /LPF
Ketones, ur: NEGATIVE
Nitrite: POSITIVE — AB
Protein, ur: NEGATIVE
RBC / HPF: NONE SEEN /HPF (ref 0–2)
Specific Gravity, Urine: 1.012 (ref 1.001–1.03)
Squamous Epithelial / HPF: NONE SEEN /HPF (ref <=5)
WBC, UA: >=60 /HPF — AB (ref 0–5)
pH: 7 (ref 5.0–8.0)

## 2020-04-23 LAB — HEMOGLOBIN A1C
Hgb A1c MFr Bld: 5.8 % of total Hgb — ABNORMAL HIGH (ref <5.7)
Mean Plasma Glucose: 120 mg/dL
eAG (mmol/L): 6.6 mmol/L

## 2020-04-23 LAB — COMPLETE METABOLIC PANEL WITH GFR
AG Ratio: 1.3 (calc) (ref 1.0–2.5)
ALT: 17 U/L (ref 6–29)
AST: 22 U/L (ref 10–35)
Albumin: 4.1 g/dL (ref 3.6–5.1)
Alkaline phosphatase (APISO): 68 U/L (ref 37–153)
BUN: 10 mg/dL (ref 7–25)
CO2: 30 mmol/L (ref 20–32)
Calcium: 10 mg/dL (ref 8.6–10.4)
Chloride: 101 mmol/L (ref 98–110)
Creat: 0.86 mg/dL (ref 0.60–0.93)
GFR, Est African American: 77 mL/min/{1.73_m2} (ref >=60)
GFR, Est Non African American: 66 mL/min/{1.73_m2} (ref >=60)
Globulin: 3.1 g/dL (calc) (ref 1.9–3.7)
Glucose, Bld: 95 mg/dL (ref 65–99)
Potassium: 3.8 mmol/L (ref 3.5–5.3)
Sodium: 141 mmol/L (ref 135–146)
Total Bilirubin: 0.6 mg/dL (ref 0.2–1.2)
Total Protein: 7.2 g/dL (ref 6.1–8.1)

## 2020-04-23 LAB — MICROALBUMIN / CREATININE URINE RATIO
Creatinine, Urine: 101 mg/dL (ref 20–275)
Microalb Creat Ratio: 32 mcg/mg creat — ABNORMAL HIGH (ref <30)
Microalb, Ur: 3.2 mg/dL

## 2020-04-23 LAB — LIPID PANEL
Cholesterol: 152 mg/dL (ref <200)
HDL: 50 mg/dL (ref >=50)
LDL Cholesterol (Calc): 80 mg/dL (calc)
Non-HDL Cholesterol (Calc): 102 mg/dL (calc) (ref <130)
Total CHOL/HDL Ratio: 3 (calc) (ref <5.0)
Triglycerides: 128 mg/dL (ref <150)

## 2020-04-23 LAB — VITAMIN D 25 HYDROXY (VIT D DEFICIENCY, FRACTURES): Vit D, 25-Hydroxy: 96 ng/mL (ref 30–100)

## 2020-04-23 LAB — MICROSCOPIC MESSAGE

## 2020-04-23 LAB — TSH: TSH: 1.84 mIU/L (ref 0.40–4.50)

## 2020-04-23 LAB — MAGNESIUM: Magnesium: 1.9 mg/dL (ref 1.5–2.5)

## 2020-04-23 NOTE — Addendum Note (Signed)
Addended by: Izora Ribas on: 04/23/2020 07:45 AM   Modules accepted: Orders

## 2020-04-25 LAB — URINE CULTURE
MICRO NUMBER:: 11750200
SPECIMEN QUALITY:: ADEQUATE

## 2020-04-26 ENCOUNTER — Other Ambulatory Visit: Payer: Self-pay | Admitting: Adult Health

## 2020-04-26 MED ORDER — NITROFURANTOIN MONOHYD MACRO 100 MG PO CAPS: 100.0000 mg | ORAL_CAPSULE | Freq: Two times a day (BID) | ORAL | 0 refills | Status: DC

## 2020-05-13 ENCOUNTER — Other Ambulatory Visit: Payer: Self-pay

## 2020-05-13 ENCOUNTER — Ambulatory Visit (AMBULATORY_SURGERY_CENTER): Payer: Self-pay | Admitting: *Deleted

## 2020-05-13 VITALS — Ht 64.0 in | Wt 146.0 lb

## 2020-05-13 DIAGNOSIS — Z86010 Personal history of colonic polyps: Secondary | ICD-10-CM

## 2020-05-13 MED ORDER — PEG-KCL-NACL-NASULF-NA ASC-C 100 G PO SOLR: 1.0000 | Freq: Once | ORAL | 0 refills | Status: AC

## 2020-05-13 NOTE — Progress Notes (Signed)
No egg or soy allergy known to patient  No issues with past sedation with any surgeries or procedures Patient denies ever being told they had issues or difficulty with intubation  No FH of Malignant Hyperthermia No diet pills per patient No home 02 use per patient  No blood thinners per patient  Pt denies issues with constipation  No A fib or A flutter  EMMI video to pt or via MyChart  COVID 19 guidelines implemented in [facility] today with Pt and RN  Pt is fully vaccinated  for Covid   Coupon given to pt in PV today , Code to Pharmacy and  NO PA's for preps discussed with pt In PV today  Discussed with pt there will be an out-of-pocket cost for prep and that varies from $0 to 70 dollars   Due to the COVID-19 pandemic we are asking patients to follow certain guidelines.  Pt aware of COVID protocols and [facility] guidelines  

## 2020-05-17 ENCOUNTER — Other Ambulatory Visit: Payer: Self-pay | Admitting: Internal Medicine

## 2020-05-17 DIAGNOSIS — J301 Allergic rhinitis due to pollen: Secondary | ICD-10-CM

## 2020-05-25 ENCOUNTER — Encounter: Payer: Self-pay | Admitting: Gastroenterology

## 2020-05-27 ENCOUNTER — Ambulatory Visit (AMBULATORY_SURGERY_CENTER): Payer: Medicare Other | Admitting: Gastroenterology

## 2020-05-27 ENCOUNTER — Encounter: Payer: Self-pay | Admitting: Gastroenterology

## 2020-05-27 ENCOUNTER — Other Ambulatory Visit: Payer: Self-pay

## 2020-05-27 VITALS — BP 134/76 | HR 64 | Temp 96.6°F | Resp 15 | Ht 64.0 in | Wt 146.0 lb

## 2020-05-27 DIAGNOSIS — D123 Benign neoplasm of transverse colon: Secondary | ICD-10-CM | POA: Diagnosis not present

## 2020-05-27 DIAGNOSIS — Z86010 Personal history of colonic polyps: Secondary | ICD-10-CM | POA: Diagnosis not present

## 2020-05-27 HISTORY — PX: COLONOSCOPY: SHX174

## 2020-05-27 MED ORDER — SODIUM CHLORIDE 0.9 % IV SOLN: 500.0000 mL | Freq: Once | INTRAVENOUS | Status: DC

## 2020-05-27 NOTE — Patient Instructions (Signed)
Await pathology  Please read over handouts about polyps and diverticulosis  Continue your normal medications  YOU HAD AN ENDOSCOPIC PROCEDURE TODAY AT [facility]:   Refer to the procedure report that was given to you for any specific questions about what was found during the examination.  If the procedure report does not answer your questions, please call your gastroenterologist to clarify.  If you requested that your care partner not be given the details of your procedure findings, then the procedure report has been included in a sealed envelope for you to review at your convenience later.  YOU SHOULD EXPECT: Some feelings of bloating in the abdomen. Passage of more gas than usual.  Walking can help get rid of the air that was put into your GI tract during the procedure and reduce the bloating. If you had a lower endoscopy (such as a colonoscopy or flexible sigmoidoscopy) you may notice spotting of blood in your stool or on the toilet paper. If you underwent a bowel prep for your procedure, you may not have a normal bowel movement for a few days.  Please Note:  You might notice some irritation and congestion in your nose or some drainage.  This is from the oxygen used during your procedure.  There is no need for concern and it should clear up in a day or so.  SYMPTOMS TO REPORT IMMEDIATELY:   Following lower endoscopy (colonoscopy or flexible sigmoidoscopy):  Excessive amounts of blood in the stool  Significant tenderness or worsening of abdominal pains  Swelling of the abdomen that is new, acute  Fever of 100F or higher  For urgent or emergent issues, a gastroenterologist can be reached at any hour by calling [phone]. Do not use MyChart messaging for urgent concerns.    DIET:  We do recommend a small meal at first, but then you may proceed to your regular diet.  Drink plenty of fluids but you should avoid alcoholic beverages for 24 hours.  ACTIVITY:  You should  plan to take it easy for the rest of today and you should NOT DRIVE or use heavy machinery until tomorrow (because of the sedation medicines used during the test).    FOLLOW UP: Our staff will call the number listed on your records 48-72 hours following your procedure to check on you and address any questions or concerns that you may have regarding the information given to you following your procedure. If we do not reach you, we will leave a message.  We will attempt to reach you two times.  During this call, we will ask if you have developed any symptoms of COVID 19. If you develop any symptoms (ie: fever, flu-like symptoms, shortness of breath, cough etc.) before then, please call [phone].  If you test positive for Covid 19 in the 2 weeks post procedure, please call and report this information to [place].    If any biopsies were taken you will be contacted by phone or by letter within the next 1-3 weeks.  Please call us at [phone] if you have not heard about the biopsies in 3 weeks.    SIGNATURES/CONFIDENTIALITY: You and/or your care partner have signed paperwork which will be entered into your electronic medical record.  These signatures attest to the fact that that the information above on your After Visit Summary has been reviewed and is understood.  Full responsibility of the confidentiality of this discharge information lies with you and/or your care-partner.

## 2020-05-27 NOTE — Progress Notes (Signed)
Pt's states no medical or surgical changes since previsit or office visit.  [place] 

## 2020-05-27 NOTE — Progress Notes (Signed)
Report given to PACU, vss 

## 2020-05-27 NOTE — Op Note (Signed)
[facility] Patient Name: Kaitlyn Townsend Procedure Date: 05/27/2020 10:26 AM MRN: [id number] Endoscopist: [place]. Loletha Carrow , MD Age: 75 Referring MD:  Date of Birth: 1945-08-24 Gender: Female Account #: [id number] Procedure:                Colonoscopy Indications:              Surveillance: Personal history of adenomatous                            polyps on last colonoscopy > 5 years ago (<45mm TA                            x 2 ; Jan 2012) Medicines:                Monitored Anesthesia Care Procedure:                Pre-Anesthesia Assessment:                           - Prior to the procedure, a History and Physical                            was performed, and patient medications and                            allergies were reviewed. The patient's tolerance of                            previous anesthesia was also reviewed. The risks                            and benefits of the procedure and the sedation                            options and risks were discussed with the patient.                            All questions were answered, and informed consent                            was obtained. Prior Anticoagulants: The patient has                            taken no previous anticoagulant or antiplatelet                            agents. ASA Grade Assessment: II - A patient with                            mild systemic disease. After reviewing the risks                            and benefits, the patient was deemed in  satisfactory condition to undergo the procedure.                           After obtaining informed consent, the colonoscope                            was passed under direct vision. Throughout the                            procedure, the patient's blood pressure, pulse, and                            oxygen saturations were monitored continuously. The                            Olympus [id number]) [id number] was  introduced                            through the anus and advanced to the the cecum,                            identified by appendiceal orifice and ileocecal                            valve. The colonoscopy was somewhat difficult due                            to multiple diverticula in the colon, a redundant                            colon and a tortuous colon. Successful completion                            of the procedure was aided by using manual                            pressure. The patient tolerated the procedure well.                            The quality of the bowel preparation was good. The                            appendiceal orifice and rectum were photographed. Scope In: 10:46:02 AM Scope Out: 10:59:37 AM Scope Withdrawal Time: 0 hours 9 minutes 2 seconds  Total Procedure Duration: 0 hours 13 minutes 35 seconds  Findings:                 The digital rectal exam findings include decreased                            sphincter tone.                           Multiple diverticula were found in the left colon  and right colon.                           A diminutive polyp was found in the transverse                            colon. The polyp was sessile. The polyp was removed                            with a cold biopsy forceps. Resection and retrieval                            were complete.                           The exam was otherwise without abnormality. Complications:            No immediate complications. Estimated Blood Loss:     Estimated blood loss was minimal. Impression:               - Decreased sphincter tone found on digital rectal                            exam.                           - Diverticulosis in the left colon and in the right                            colon.                           - One diminutive polyp in the transverse colon,                            removed with a cold biopsy forceps. Resected and                             retrieved.                           - The examination was otherwise normal. Recommendation:           - Patient has a contact number available for                            emergencies. The signs and symptoms of potential                            delayed complications were discussed with the                            patient. Return to normal activities tomorrow.                            Written discharge instructions were provided to the  patient.                           - Resume previous diet.                           - Continue present medications.                           - Await pathology results.                           - No repeat colonoscopy due to age. Ryian Lynde L. Loletha Carrow, MD 05/27/2020 11:03:44 AM This report has been signed electronically.

## 2020-05-27 NOTE — Progress Notes (Signed)
Called to room to assist during endoscopic procedure.  Patient ID and intended procedure confirmed with present staff. Received instructions for my participation in the procedure from the performing physician.  

## 2020-05-31 ENCOUNTER — Telehealth: Payer: Self-pay

## 2020-05-31 NOTE — Telephone Encounter (Signed)
LVM

## 2020-06-10 ENCOUNTER — Encounter: Payer: Self-pay | Admitting: Gastroenterology

## 2020-06-21 ENCOUNTER — Other Ambulatory Visit: Payer: Self-pay | Admitting: Adult Health Nurse Practitioner

## 2020-06-23 ENCOUNTER — Other Ambulatory Visit: Payer: Self-pay

## 2020-06-23 MED ORDER — ACCU-CHEK AVIVA PLUS VI STRP: ORAL_STRIP | 5 refills | Status: DC

## 2020-06-24 ENCOUNTER — Other Ambulatory Visit: Payer: Self-pay

## 2020-06-24 DIAGNOSIS — E785 Hyperlipidemia, unspecified: Secondary | ICD-10-CM

## 2020-06-24 MED ORDER — GLUCOSE BLOOD VI STRP: ORAL_STRIP | 12 refills | Status: DC

## 2020-06-25 DIAGNOSIS — N951 Menopausal and female climacteric states: Secondary | ICD-10-CM | POA: Insufficient documentation

## 2020-07-12 ENCOUNTER — Other Ambulatory Visit: Payer: Self-pay | Admitting: Adult Health

## 2020-07-26 ENCOUNTER — Other Ambulatory Visit: Payer: Self-pay | Admitting: Internal Medicine

## 2020-07-26 DIAGNOSIS — E1140 Type 2 diabetes mellitus with diabetic neuropathy, unspecified: Secondary | ICD-10-CM

## 2020-07-26 DIAGNOSIS — E0822 Diabetes mellitus due to underlying condition with diabetic chronic kidney disease: Secondary | ICD-10-CM

## 2020-07-26 DIAGNOSIS — E785 Hyperlipidemia, unspecified: Secondary | ICD-10-CM

## 2020-07-26 DIAGNOSIS — N182 Chronic kidney disease, stage 2 (mild): Secondary | ICD-10-CM

## 2020-07-26 DIAGNOSIS — K2100 Gastro-esophageal reflux disease with esophagitis, without bleeding: Secondary | ICD-10-CM

## 2020-07-26 DIAGNOSIS — E1169 Type 2 diabetes mellitus with other specified complication: Secondary | ICD-10-CM

## 2020-07-26 MED ORDER — METFORMIN HCL ER 500 MG PO TB24
ORAL_TABLET | ORAL | 3 refills | Status: DC
Start: 2020-07-26 — End: 2020-10-27

## 2020-08-25 DIAGNOSIS — Z86010 Personal history of colonic polyps: Secondary | ICD-10-CM | POA: Insufficient documentation

## 2020-08-25 DIAGNOSIS — Z860101 Personal history of adenomatous and serrated colon polyps: Secondary | ICD-10-CM | POA: Insufficient documentation

## 2020-08-25 NOTE — Progress Notes (Signed)
MEDICARE VISIT AND FOLLOW UP  Assessment:    Annual Medicare Wellness Visit Due annually  Health maintenance reviewed Declines shingrix at this time  Essential hypertension Continue medication Monitor blood pressure at home; call if consistently over 130/80 Continue DASH diet.   Reminder to go to the ER if any CP, SOB, nausea, dizziness, severe HA, changes vision/speech, left arm numbness and tingling and jaw pain.  Gastroesophageal reflux disease, esophagitis presence not specified Well managed on current medications Discussed diet, avoiding triggers and other lifestyle changes  OAB (overactive bladder) Symptoms improved with vesicare, uses one liner per day  Nephrolithiasis Increase fluids; followed by Dr. Matilde Sprang PRN, push fluids  Vitamin D deficiency At goal at recent check; continue to recommend supplementation for goal of 70-100 Defer vitamin D level  T2DM with CKD  Education: Reviewed 'ABCs' of diabetes management (respective goals in parentheses):  A1C (<7), blood pressure (<130/80), and cholesterol (LDL <70) Eye Exam yearly and Dental Exam every 6 months. UTD - requested reports Dietary recommendations Physical Activity recommendations Check feet daily; no concerns;  - A1C, CMP/GFR  Mixed hyperlipidemia associated with T2DM (HCC) Continue medications: atorvastatin Discussed LDL goal <70 Continue low cholesterol diet and exercise.  Check lipid panel.   T2DM with sensory neuropathy (HCC) Monitor feet daily, declines meds  BMI 24 - normal weight Continue to recommend diet heavy in fruits and veggies and low in animal meats, cheeses, and dairy products, appropriate calorie intake Discuss exercise recommendations routinely Continue to monitor weight at each visit  Medication management CBC, CMP/GFR, magnesium   Recurrent cold sores Acyclovir PRN  History of adenomatous polyp of colon No further colonoscopies due to age per Dr. Loletha Carrow Denies  concerning sx  High fiber plant based diet encouraged, continue soluble fiber supplement  Post menopausal hot flashes Has been on estrogen for many years; discussed risks and recommendation to taper to stop. She is receptive to try this once cooler this fall.   Allergic rhinitis - H2i, saline irrigations/flonase PRN, allergy hygiene explained. - follow up if not improving or with progressive sx   Orders Placed This Encounter  Procedures   CBC with Differential/Platelet   COMPLETE METABOLIC PANEL WITH GFR   Magnesium   Lipid panel   TSH   Hemoglobin A1c    Over 40 minutes of exam, counseling, chart review and critical decision making was performed Future Appointments  Date Time Provider [place]  04/28/2021 10:00 AM Unk Pinto, MD [facility] None  08/26/2021  9:00 AM Liane Comber, NP [facility] None     Plan:   During the course of the visit the patient was educated and counseled about appropriate screening and preventive services including:   Pneumococcal vaccine  Prevnar 13 Influenza vaccine Td vaccine Screening electrocardiogram Bone densitometry screening Colorectal cancer screening Diabetes screening Glaucoma screening Nutrition counseling  Advanced directives: requested   Subjective:  Kaitlyn Townsend is a 75 y.o. female who presents for AWV and follow up. 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   She is married, 1 son,  no grandchildren. 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   She notes about 2 weeks started having rhinitis, post nasal drip with cough, green/yellow secretion. She notes mild aching headache. Denies sinus tenderness. She had 1 day of chills, no temp. Has noted sneezing. She notes prone to al  She is followed by Dr. Matilde Sprang as needed at [facility] urology for recurrent nephrolithiasis. Vesicare for OAB is working well for her.  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   She is on estrace 1 mg daily following total hysterectomy and doing well with this. She is on bASA and she is active. She had significant hot flashes with attempt to taper continues with 1 mg daily, she is receptive to trying to taper later this year in the fall once cooler after discussion.   She has GERD on omeprazole 20 mg daily.   BMI is Body mass index is 24.55 kg/m., she has been working on diet and exercise. She was on stationary bike and doing well but admitted reduced, plants to increase back. Weight is down from 168-170 lb.  Wt Readings from Last 3 Encounters:  08/26/20 143 lb (64.9 kg)  05/27/20 146 lb (66.2 kg)  05/13/20 146 lb (66.2 kg)    Her blood pressure has been controlled at home, today their BP is BP: 130/70 She does workout. She denies chest pain, shortness of breath, dizziness.   She is on cholesterol medication (atorvastatin 80 mg daily) and denies myalgias. Her LDL cholesterol is not at goal of LDL <70 at last check. The cholesterol last visit was:   Lab Results  Component Value Date   CHOL 152 04/22/2020   HDL 50 04/22/2020   LDLCALC 80 04/22/2020   TRIG 128 04/22/2020   CHOLHDL 3.0 04/22/2020    She has been working on diet and exercise for T2DM on metformin 500 mg daily and denies foot ulcerations, increased appetite, nausea, paresthesia of the feet, polydipsia, polyuria, visual disturbances, vomiting and weight loss. She does check fasting sugars, range  (80s-110s). Last A1C in the office was:  Lab Results  Component Value Date   HGBA1C 5.8 (H) 04/22/2020   Last GFR: Lab Results  Component Value Date   [facility] 66 04/22/2020   Patient is on Vitamin D supplement and approaching goal at recent check, alternates 5000 IU and 10000 IU:    Lab Results  Component Value Date   VD25OH 96 04/22/2020       Medication Review:  Current Outpatient Medications (Endocrine & Metabolic):    estradiol (ESTRACE) 1 MG tablet, Take  1 tablet  Daily   metFORMIN (GLUCOPHAGE XR) 500 MG 24 hr tablet, Take  1 tablet   2 x /day  with Meals  for Diabetes (Patient taking differently: Take 1 tablet daily with Meals for Diabetes)  Current Outpatient Medications (Cardiovascular):    atorvastatin (LIPITOR) 80 MG tablet, TAKE 1 TABLET BY MOUTH ONCE DAILY FOR CHOLESTEROL   enalapril (VASOTEC) 20 MG tablet, Take 1 tablet by mouth once daily for blood pressure   hydrochlorothiazide (HYDRODIURIL) 25 MG tablet, Take  1 tablet  Daily  for BP & Fluid Retention /Ankle Swelling  Current Outpatient Medications (Respiratory):    cetirizine (ZYRTEC ALLERGY) 10 MG tablet, Take 1 tab daily as needed for allergies.  Current Outpatient Medications (Analgesics):    aspirin 81 MG chewable tablet, Chew by mouth every other day.   Current Outpatient Medications (Other):    blood glucose meter kit and supplies, Test sugars as directed  by provider. Dispense based insurance preference. E11.22   Blood Glucose Monitoring Suppl (ACCU-CHEK AVIVA PLUS) w/Device KIT, Check blood sugar 1 time daily-DX-R73.09   Cholecalciferol (VITAMIN D PO), Take 5,000 Int'l Units by mouth daily. Take 10,000 units M,W,F and 5000 units all other days   glucose blood test strip, Use to check blood sugar once daily.   hydrocortisone (ANUSOL-HC) 25 MG suppository, Place 1 suppository (25 mg total) rectally 2 (two) times daily. For 7 days   Lancets (ACCU-CHEK SOFT TOUCH) lancets, Check blood sugar 1 time  daily-DX-R73.09   MAGNESIUM PO, Take 500 mg by mouth daily.    Multiple Vitamins-Minerals (MULTIVITAMIN WITH MINERALS) tablet, Take 1 tablet by mouth daily.   Omega-3 Fatty Acids (FISH OIL PO), Take by mouth daily.   omeprazole (PRILOSEC) 20 MG capsule, Take 1 capsule (20 mg total) by mouth daily. 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, without long-term current use of insulin ([place]) 11/22/2017   Former smoker (25 pack year, quit 1996) 08/21/2017   FHx: heart disease 08/21/2017   Recurrent cold sores 05/14/2017   Nephrolithiasis 02/16/2016   OAB (overactive bladder) 02/16/2016   Medication management 02/13/2013   Hyperlipidemia associated with type 2 diabetes mellitus ([place])    Essential hypertension    Vitamin D deficiency    GERD (gastroesophageal reflux disease)     Screening Tests Immunization History  Administered Date(s) Administered   Influenza Split 10/31/2012   Influenza, High Dose Seasonal PF 09/13/2016, 11/08/2017   Influenza-Unspecified 09/26/2014, 10/06/2015, 09/04/2018   PFIZER(Purple Top)SARS-COV-2 Vaccination 03/09/2019, 04/02/2019, 10/29/2019   Pneumococcal Conjugate-13 03/09/2014   Pneumococcal Polysaccharide-23 08/06/2015   Td 08/09/2012   Zoster, Live 08/14/2012   Tetanus: 2014 Influenza: 10/2019 Pneumonia 2/2 Shingles: zoster in 2014, declines shingrix  Covid 19: 3/3, 2021, pfizer  Preventative care: Last colonoscopy: 05/2020, Dr. Loletha Carrow, 1 adenomatous polyp, no follow up  recommended due to age Mammogram:12/2019 Last pap smear/pelvic exam: remote; s/p total hysterectomy DEXA: 2019, normal   Former smoker - quit 1996, 26 pack year hx, declines sx or CXR  Names of Other Physician/Practitioners you currently use: 1. [facility] here for primary care 2. [facility], Dr, Mariea Stable, eye doctor, last visit 12/21/2020 - report requested 3. Dr. Deatra Ina, dentist, last visit 2021, q64m Patient Care Team: MUnk Pinto MD as PCP - General (Internal Medicine) KInda Castle MD (Inactive) as Consulting Physician (Gastroenterology) MBjorn Loser MD as Consulting Physician (Urology)  SURGICAL HISTORY She  has a past surgical history that includes Knee surgery (Left, 09/2009); Abdominal hysterectomy (Bilateral, 1977); and Colonoscopy (05/27/2020). FAMILY HISTORY Her family history includes Colon polyps in her brother and sister; Heart disease in her brother and mother; Hypertension in her brother; Kidney disease in her brother; Stroke in her maternal grandmother; Ulcers in her father. SOCIAL HISTORY She  reports that she quit smoking about 26 years ago. Her smoking use included cigarettes. She started smoking about 50 years ago. She has a 25.50 pack-year smoking history.  She has never used smokeless tobacco. She reports current alcohol use. She reports that she does not use drugs.  MEDICARE WELLNESS OBJECTIVES: Physical activity: Current Exercise Habits: Home exercise routine, Type of exercise: Other - see comments (heavy gardening), Time (Minutes): 30, Frequency (Times/Week): 7, Weekly Exercise (Minutes/Week): 210, Intensity: Mild, Exercise limited by: None identified Cardiac risk factors: Cardiac Risk Factors include: advanced age (>69mn, >>61women);dyslipidemia;hypertension;diabetes mellitus;smoking/ tobacco exposure Depression/mood screen:   Depression screen [facility] 08/26/2020  Decreased Interest 0  Down,  Depressed, Hopeless 0  PHQ - 2 Score 0    ADLs:  In your present state of health, do you have any difficulty performing the following activities: 08/26/2020  Hearing? N  Vision? N  Difficulty concentrating or making decisions? N  Walking or climbing stairs? N  Dressing or bathing? N  Doing errands, shopping? N  Some recent data might be hidden     Cognitive Testing  Alert? Yes  Normal Appearance?Yes  Oriented to person? Yes  Place? Yes   Time? Yes  Recall of three objects?  Yes  Can perform simple calculations? Yes  Displays appropriate judgment?Yes  Can read the correct time from a watch face?Yes  EOL planning: Does Patient Have a Medical Advance Directive?: Yes Type of Advance Directive: Healthcare Power of Attorney, Living will Does patient want to make changes to medical advance directive?: No - Patient declined Copy of HDes [place] Chart?: Yes - validated most recent copy scanned in chart (See row information)  Review of Systems  Constitutional:  Negative for fever, malaise/fatigue and weight loss.  HENT:  Positive for congestion. Negative for hearing loss, sinus pain, sore throat and tinnitus.   Eyes:  Negative for blurred vision and double vision.  Respiratory:  Negative for cough, sputum production, shortness of breath and wheezing.   Cardiovascular:  Negative for chest pain, palpitations, orthopnea, claudication, leg swelling and PND.  Gastrointestinal:  Negative for abdominal pain, blood in stool, constipation, diarrhea, heartburn, melena, nausea and vomiting.  Genitourinary: Negative.   Musculoskeletal:  Negative for falls, joint pain and myalgias.  Skin:  Negative for itching and rash.  Neurological:  Negative for dizziness, tingling, sensory change, weakness and headaches.  Endo/Heme/Allergies:  Positive for environmental allergies. Negative for polydipsia.  Psychiatric/Behavioral: Negative.  Negative for depression, memory loss, substance abuse  and suicidal ideas. The patient is not nervous/anxious and does not have insomnia.   All other systems reviewed and are negative.   Objective:     Today's Vitals   08/26/20 0919  BP: 130/70  Pulse: 69  Temp: (!) 97.5 F (36.4 C)  SpO2: 96%  Weight: 143 lb (64.9 kg)  Height: _0  (1.626 m)   Body mass index is 24.55 kg/m.  General appearance: alert, no distress, WD/WN, female HEENT: normocephalic, sclerae anicteric, TMs pearly, nares patent, no discharge or erythema, pharynx normal Oral cavity: MMM, no lesions Neck: supple, no lymphadenopathy, no thyromegaly, no masses Heart: RRR, normal S1, S2, no murmurs Lungs: CTA bilaterally, no wheezes, rhonchi, or rales Abdomen: +bs, soft, non tender, non distended, no masses, no hepatomegaly, no splenomegaly Musculoskeletal: nontender, no swelling, no obvious deformity Extremities: no edema, no cyanosis, no clubbing Pulses: 2+ symmetric, upper and lower extremities, normal cap refill Neurological: alert, oriented x 3, CN2-12 intact, strength normal upper extremities and lower extremities, sensation normal throughout, DTRs 2+ throughout, no cerebellar signs, gait normal Psychiatric: normal affect, behavior normal, pleasant    Medicare Attestation I have personally reviewed:  The patient's medical and social history Their use of alcohol, tobacco or illicit drugs Their current medications and supplements The patient's functional ability including ADLs,fall risks, home safety risks, cognitive, and hearing and visual impairment Diet and physical activities Evidence for depression or mood disorders  The patient's weight, height, BMI, and visual acuity have been recorded in the chart.  I have made referrals, counseling, and provided education to the patient based on review of the above and I have provided the patient with a written personalized care plan for preventive services.     Izora Ribas, NP   08/26/2020

## 2020-08-26 ENCOUNTER — Encounter: Payer: Self-pay | Admitting: Adult Health

## 2020-08-26 ENCOUNTER — Ambulatory Visit (INDEPENDENT_AMBULATORY_CARE_PROVIDER_SITE_OTHER): Payer: Medicare Other | Admitting: Adult Health

## 2020-08-26 ENCOUNTER — Other Ambulatory Visit: Payer: Self-pay

## 2020-08-26 VITALS — BP 130/70 | HR 69 | Temp 97.5°F | Ht 64.0 in | Wt 143.0 lb

## 2020-08-26 DIAGNOSIS — N200 Calculus of kidney: Secondary | ICD-10-CM

## 2020-08-26 DIAGNOSIS — Z6825 Body mass index (BMI) 25.0-25.9, adult: Secondary | ICD-10-CM

## 2020-08-26 DIAGNOSIS — E0822 Diabetes mellitus due to underlying condition with diabetic chronic kidney disease: Secondary | ICD-10-CM | POA: Diagnosis not present

## 2020-08-26 DIAGNOSIS — N3281 Overactive bladder: Secondary | ICD-10-CM

## 2020-08-26 DIAGNOSIS — B001 Herpesviral vesicular dermatitis: Secondary | ICD-10-CM

## 2020-08-26 DIAGNOSIS — R6889 Other general symptoms and signs: Secondary | ICD-10-CM | POA: Diagnosis not present

## 2020-08-26 DIAGNOSIS — Z79899 Other long term (current) drug therapy: Secondary | ICD-10-CM

## 2020-08-26 DIAGNOSIS — E1169 Type 2 diabetes mellitus with other specified complication: Secondary | ICD-10-CM

## 2020-08-26 DIAGNOSIS — E785 Hyperlipidemia, unspecified: Secondary | ICD-10-CM

## 2020-08-26 DIAGNOSIS — Z0001 Encounter for general adult medical examination with abnormal findings: Secondary | ICD-10-CM | POA: Diagnosis not present

## 2020-08-26 DIAGNOSIS — K219 Gastro-esophageal reflux disease without esophagitis: Secondary | ICD-10-CM | POA: Diagnosis not present

## 2020-08-26 DIAGNOSIS — Z0000 Encounter for general adult medical examination without abnormal findings: Secondary | ICD-10-CM

## 2020-08-26 DIAGNOSIS — E1140 Type 2 diabetes mellitus with diabetic neuropathy, unspecified: Secondary | ICD-10-CM | POA: Diagnosis not present

## 2020-08-26 DIAGNOSIS — N951 Menopausal and female climacteric states: Secondary | ICD-10-CM | POA: Diagnosis not present

## 2020-08-26 DIAGNOSIS — Z87891 Personal history of nicotine dependence: Secondary | ICD-10-CM

## 2020-08-26 DIAGNOSIS — E559 Vitamin D deficiency, unspecified: Secondary | ICD-10-CM

## 2020-08-26 DIAGNOSIS — N182 Chronic kidney disease, stage 2 (mild): Secondary | ICD-10-CM

## 2020-08-26 DIAGNOSIS — Z9109 Other allergy status, other than to drugs and biological substances: Secondary | ICD-10-CM

## 2020-08-26 DIAGNOSIS — Z86010 Personal history of colonic polyps: Secondary | ICD-10-CM

## 2020-08-26 DIAGNOSIS — I10 Essential (primary) hypertension: Secondary | ICD-10-CM

## 2020-08-26 MED ORDER — CETIRIZINE HCL 10 MG PO TABS: ORAL_TABLET | ORAL | 2 refills | Status: DC

## 2020-08-27 LAB — COMPLETE METABOLIC PANEL WITH GFR
AG Ratio: 1.5 (calc) (ref 1.0–2.5)
ALT: 24 U/L (ref 6–29)
AST: 26 U/L (ref 10–35)
Albumin: 4.1 g/dL (ref 3.6–5.1)
Alkaline phosphatase (APISO): 69 U/L (ref 37–153)
BUN: 12 mg/dL (ref 7–25)
CO2: 33 mmol/L — ABNORMAL HIGH (ref 20–32)
Calcium: 9.9 mg/dL (ref 8.6–10.4)
Chloride: 104 mmol/L (ref 98–110)
Creat: 0.78 mg/dL (ref 0.60–1.00)
Globulin: 2.8 g/dL (calc) (ref 1.9–3.7)
Glucose, Bld: 91 mg/dL (ref 65–99)
Potassium: 4.4 mmol/L (ref 3.5–5.3)
Sodium: 141 mmol/L (ref 135–146)
Total Bilirubin: 0.6 mg/dL (ref 0.2–1.2)
Total Protein: 6.9 g/dL (ref 6.1–8.1)
eGFR: 79 mL/min/{1.73_m2} (ref >=60)

## 2020-08-27 LAB — CBC WITH DIFFERENTIAL/PLATELET
Absolute Monocytes: 432 cells/uL (ref 200–950)
Basophils Absolute: 51 cells/uL (ref 0–200)
Basophils Relative: 1.1 %
Eosinophils Absolute: 101 cells/uL (ref 15–500)
Eosinophils Relative: 2.2 %
HCT: 38.4 % (ref 35.0–45.0)
Hemoglobin: 12.7 g/dL (ref 11.7–15.5)
Lymphs Abs: 1748 cells/uL (ref 850–3900)
MCH: 28.3 pg (ref 27.0–33.0)
MCHC: 33.1 g/dL (ref 32.0–36.0)
MCV: 85.7 fL (ref 80.0–100.0)
MPV: 10.7 fL (ref 7.5–12.5)
Monocytes Relative: 9.4 %
Neutro Abs: 2268 cells/uL (ref 1500–7800)
Neutrophils Relative %: 49.3 %
Platelets: 257 10*3/uL (ref 140–400)
RBC: 4.48 10*6/uL (ref 3.80–5.10)
RDW: 14.2 % (ref 11.0–15.0)
Total Lymphocyte: 38 %
WBC: 4.6 10*3/uL (ref 3.8–10.8)

## 2020-08-27 LAB — HEMOGLOBIN A1C
Hgb A1c MFr Bld: 5.8 % of total Hgb — ABNORMAL HIGH (ref <5.7)
Mean Plasma Glucose: 120 mg/dL
eAG (mmol/L): 6.6 mmol/L

## 2020-08-27 LAB — LIPID PANEL
Cholesterol: 137 mg/dL (ref <200)
HDL: 48 mg/dL — ABNORMAL LOW (ref >=50)
LDL Cholesterol (Calc): 70 mg/dL (calc)
Non-HDL Cholesterol (Calc): 89 mg/dL (calc) (ref <130)
Total CHOL/HDL Ratio: 2.9 (calc) (ref <5.0)
Triglycerides: 103 mg/dL (ref <150)

## 2020-08-27 LAB — MAGNESIUM: Magnesium: 2.1 mg/dL (ref 1.5–2.5)

## 2020-08-27 LAB — TSH: TSH: 1.62 mIU/L (ref 0.40–4.50)

## 2020-10-27 ENCOUNTER — Other Ambulatory Visit: Payer: Self-pay | Admitting: Internal Medicine

## 2020-10-27 DIAGNOSIS — E1140 Type 2 diabetes mellitus with diabetic neuropathy, unspecified: Secondary | ICD-10-CM

## 2020-10-27 DIAGNOSIS — N182 Chronic kidney disease, stage 2 (mild): Secondary | ICD-10-CM

## 2020-10-27 DIAGNOSIS — K2100 Gastro-esophageal reflux disease with esophagitis, without bleeding: Secondary | ICD-10-CM

## 2020-10-27 DIAGNOSIS — E1169 Type 2 diabetes mellitus with other specified complication: Secondary | ICD-10-CM

## 2020-10-27 DIAGNOSIS — E0822 Diabetes mellitus due to underlying condition with diabetic chronic kidney disease: Secondary | ICD-10-CM

## 2020-10-27 MED ORDER — METFORMIN HCL ER 500 MG PO TB24: ORAL_TABLET | ORAL | 3 refills | Status: DC

## 2020-11-10 ENCOUNTER — Other Ambulatory Visit: Payer: Self-pay | Admitting: Internal Medicine

## 2020-11-10 DIAGNOSIS — Z1231 Encounter for screening mammogram for malignant neoplasm of breast: Secondary | ICD-10-CM

## 2020-12-24 ENCOUNTER — Ambulatory Visit
Admission: RE | Admit: 2020-12-24 | Discharge: 2020-12-24 | Disposition: A | Discharge status: Home / Self Care | Payer: Medicare Other | Source: Ambulatory Visit | Attending: Internal Medicine | Admitting: Internal Medicine

## 2020-12-24 DIAGNOSIS — Z1231 Encounter for screening mammogram for malignant neoplasm of breast: Secondary | ICD-10-CM

## 2020-12-30 NOTE — Progress Notes (Signed)
3 MONTH FOLLOW UP  Assessment:     Essential hypertension Continue medication Monitor blood pressure at home; call if consistently over 130/80 Continue DASH diet.   Reminder to go to the ER if any CP, SOB, nausea, dizziness, severe HA, changes vision/speech, left arm numbness and tingling and jaw pain.  T2DM with CKD [facility]) Education: Reviewed ABCs of diabetes management (respective goals in parentheses):  A1C (<7), blood pressure (<130/80), and cholesterol (LDL <70) Eye Exam yearly and Dental Exam every 6 months. UTD - requested reports Dietary recommendations Physical Activity recommendations Check feet daily; no concerns;  - A1C, CMP/GFR  Mixed hyperlipidemia associated with T2DM (HCC) Continue medications: atorvastatin Discussed LDL goal <70 Continue low cholesterol diet and exercise.  Check lipid panel.   T2DM with sensory neuropathy (HCC) Monitor feet daily, declines meds  Vitamin D deficiency At goal at recent check; continue to recommend supplementation for goal of 70-100 Defer vitamin D level  BMI 25 - normal weight Continue to recommend diet heavy in fruits and veggies and low in animal meats, cheeses, and dairy products, appropriate calorie intake Discuss exercise recommendations routinely Continue to monitor weight at each visit  Medication management CBC, CMP/GFR, magnesium   Post menopausal hot flashes Has been on estrogen for many years;  discussed risks and recommendation to taper to stop.  She is doing well alternating 1 mg and 0.5 mg; continue slow taper down to 0.5 mg daily.  If starts having hot flash breakthrough recommend adding estroven complete or similar with black cohosh, evening primrose, soy isoflavones  URI/cough Resolving; benign exam without red flags concerning for CAP Discussed the importance of avoiding unnecessary antibiotic therapy. Suggested symptomatic OTC remedies. Nasal saline spray for congestion. Nasal steroids, allergy  pill, oral steroids offered Follow up if any worsening, fever/chills, dyspnea  Urine odor/frequency Intermittent sx; check UA/Culture, treat as indicated Push fluids, can do cranberry, azo, vit C supplement    Orders Placed This Encounter  Procedures   Urine Culture   CBC with Differential/Platelet   COMPLETE METABOLIC PANEL WITH GFR   Magnesium   Lipid panel   TSH   Hemoglobin A1c   Urinalysis, Routine w reflex microscopic     Over 30 minutes of exam, counseling, chart review and critical decision making was performed Future Appointments  Date Time Provider [place]  04/28/2021 10:00 AM Unk Pinto, MD [facility] None  08/26/2021  9:00 AM Liane Comber, NP [facility] None     Subjective:  Kaitlyn Townsend is a 75 y.o. female who presents for 3 month follow up on diabetes with complications.   She reports URI sx; began about 2 weeks ago, with sore throat, nasal congestion, coughing, developed sinus tenderness, headache, no fever/chills. She took leftover cough syrup, sudafed, flonase which has improved sinus sx, however reports cough settled into chest, denies CP, dyspnea, fatigue, only sore when coughing, productive intermittently of thick yellow mucus. Denies  She also reports cloudy urine, foul odor, urinary urgency, near incontinence episodes; initially with some dysuria, but has been pushing cranberry juice. Intermittent symptoms. Denies vaginal discharge with odor or itching.   She is followed by Dr. Matilde Sprang as needed at [facility] urology for recurrent nephrolithiasis. Vesicare for OAB is working well for her.  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   She is on estrace 1 mg daily following total hysterectomy and doing well with this. She is on bASA and she is active. She was on estroven 1 mg, reports since our  conversation last visit has been alternating 1 mg and 0.5 mg and doing well with this.   She has GERD on omeprazole 20 mg daily.   BMI is  Body mass index is 25.06 kg/m., she has been working on diet and exercise. She was on stationary bike and doing well but admitted reduced, plants to increase back. Weight is down from 168-170 lb.  Wt Readings from Last 3 Encounters:  12/31/20 146 lb (66.2 kg)  08/26/20 143 lb (64.9 kg)  05/27/20 146 lb (66.2 kg)    Her blood pressure has been controlled at home, today their BP is BP: 120/68 She does workout. She denies chest pain, shortness of breath, dizziness.   She is on cholesterol medication (atorvastatin 80 mg daily) and denies myalgias. Her LDL cholesterol is not at goal of LDL <70 at last check. The cholesterol last visit was:   Lab Results  Component Value Date   CHOL 137 08/26/2020   HDL 48 (L) 08/26/2020   LDLCALC 70 08/26/2020   TRIG 103 08/26/2020   CHOLHDL 2.9 08/26/2020    She has been working on diet and exercise for T2DM on metformin 500 mg daily and denies foot ulcerations, increased appetite, nausea, paresthesia of the feet, polydipsia, polyuria, visual disturbances, vomiting and weight loss. She hasn't been checking sugars, machine malfunctioning last 3 weeks, typically fasting <110. Last A1C in the office was:  Lab Results  Component Value Date   HGBA1C 5.8 (H) 08/26/2020   Last GFR: Lab Results  Component Value Date   GFRNONAA 66 04/22/2020   Patient is on Vitamin D supplement and approaching goal at recent check, alternates 5000 IU and 10000 IU:    Lab Results  Component Value Date   VD25OH 96 04/22/2020       Medication Review:  Current Outpatient Medications (Endocrine & Metabolic):    metFORMIN (GLUCOPHAGE XR) 500 MG 24 hr tablet, Take 1 tablet daily with Meals for Diabetes   estradiol (ESTRACE) 0.5 MG tablet, Take  1 tablet  Daily  Current Outpatient Medications (Cardiovascular):    atorvastatin (LIPITOR) 80 MG tablet, TAKE 1 TABLET BY MOUTH ONCE DAILY FOR CHOLESTEROL   enalapril (VASOTEC) 20 MG tablet, Take 1 tablet by mouth once daily for blood  pressure   hydrochlorothiazide (HYDRODIURIL) 25 MG tablet, Take  1 tablet  Daily  for BP & Fluid Retention /Ankle Swelling  Current Outpatient Medications (Respiratory):    cetirizine (ZYRTEC ALLERGY) 10 MG tablet, Take 1 tab daily as needed for allergies.  Current Outpatient Medications (Analgesics):    aspirin 81 MG chewable tablet, Chew by mouth every other day.   Current Outpatient Medications (Other):    blood glucose meter kit and supplies, Test sugars as directed by provider. Dispense based insurance preference. E11.22   Cholecalciferol (VITAMIN D PO), Take 5,000 Int'l Units by mouth daily. Take 10,000 units M,W,F and 5000 units all other days   glucose blood test strip, Use to check blood sugar once daily.   hydrocortisone (ANUSOL-HC) 25 MG suppository, Place 1 suppository (25 mg total) rectally 2 (two) times daily. For 7 days   KRILL OIL PO, Take by mouth daily.   Lancets (ACCU-CHEK SOFT TOUCH) lancets, Check blood sugar 1 time daily-DX-R73.09   MAGNESIUM PO, Take 500 mg by mouth daily.    Multiple Vitamins-Minerals (MULTIVITAMIN WITH MINERALS) tablet, Take 1 tablet by mouth daily.   omeprazole (PRILOSEC) 20 MG capsule, Take 1 capsule (20 mg total) by mouth daily. 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 ([place]) 07/02/2019   Diabetes mellitus due to underlying condition with stage 2 chronic kidney disease, without long-term current use of insulin ([place]) 11/22/2017   Former smoker (25 pack year,  quit 1996) 08/21/2017   FHx: heart disease 08/21/2017   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 FAMILY HISTORY Her family history includes Colon polyps in her brother and sister; Heart disease in her brother and mother; Hypertension in her brother; Kidney disease in her brother; Stroke in her maternal grandmother; Ulcers in her father. SOCIAL HISTORY She  reports that she quit smoking about 26 years ago. Her smoking use included cigarettes. She started smoking about 50 years ago. She has a 25.50 pack-year smoking history. She has never used smokeless tobacco. She reports current alcohol use. She reports that she does not use drugs.   Review of Systems  Constitutional:  Negative for fever, malaise/fatigue and weight loss.  HENT:  Positive for congestion. Negative for hearing loss, sinus pain, sore throat and tinnitus.   Eyes:  Negative for blurred vision and double vision.  Respiratory:  Positive for cough (productive, improving). Negative for sputum production, shortness of breath and wheezing.   Cardiovascular:  Negative for chest pain, palpitations, orthopnea, claudication, leg swelling and PND.  Gastrointestinal:  Negative for abdominal pain, blood in stool, constipation, diarrhea, heartburn, melena, nausea and vomiting.  Genitourinary:  Positive for frequency and urgency. Negative for dysuria (improved), flank pain and hematuria.       Strong urine odor  Musculoskeletal:  Negative for falls, joint pain and myalgias.  Skin:  Negative for itching and rash.   Neurological:  Negative for dizziness, tingling, sensory change, weakness and headaches.  Endo/Heme/Allergies:  Positive for environmental allergies. Negative for polydipsia.  Psychiatric/Behavioral: Negative.  Negative for depression, memory loss, substance abuse and suicidal ideas. The patient is not nervous/anxious and does not have insomnia.   All other systems reviewed and are negative.   Objective:     Today's Vitals   12/31/20 0923  BP: 120/68  Pulse: 77  Temp: (!) 97.5 F (36.4 C)  SpO2: 99%  Weight: 146 lb (66.2 kg)   Body mass index is 25.06 kg/m.  General appearance: alert, no distress, WD/WN, female HEENT: normocephalic, sclerae anicteric, TMs pearly, nares patent, no discharge or erythema, pharynx normal Oral cavity: MMM, no lesions Neck: supple, no lymphadenopathy, no thyromegaly, no masses Heart: RRR, normal S1, S2, no murmurs Lungs: CTA bilaterally, no wheezes, rhonchi, very subtle rales L base, clear with cough.  Abdomen: +bs, soft, non tender, non distended, no masses, no hepatomegaly, no splenomegaly Musculoskeletal: nontender,  no swelling, no obvious deformity Extremities: no edema, no cyanosis, no clubbing Pulses: 2+ symmetric, upper and lower extremities, normal cap refill Neurological: alert, oriented x 3, CN2-12 intact, strength normal upper extremities and lower extremities, sensation normal throughout, DTRs 2+ throughout, no cerebellar signs, gait normal Psychiatric: normal affect, behavior normal, pleasant       Izora Ribas, NP   12/31/2020

## 2020-12-31 ENCOUNTER — Encounter: Payer: Self-pay | Admitting: Adult Health

## 2020-12-31 ENCOUNTER — Other Ambulatory Visit: Payer: Self-pay

## 2020-12-31 ENCOUNTER — Ambulatory Visit (INDEPENDENT_AMBULATORY_CARE_PROVIDER_SITE_OTHER): Payer: Medicare Other | Admitting: Adult Health

## 2020-12-31 VITALS — BP 120/68 | HR 77 | Temp 97.5°F | Wt 146.0 lb

## 2020-12-31 DIAGNOSIS — E0822 Diabetes mellitus due to underlying condition with diabetic chronic kidney disease: Secondary | ICD-10-CM | POA: Diagnosis not present

## 2020-12-31 DIAGNOSIS — E559 Vitamin D deficiency, unspecified: Secondary | ICD-10-CM

## 2020-12-31 DIAGNOSIS — I10 Essential (primary) hypertension: Secondary | ICD-10-CM | POA: Diagnosis not present

## 2020-12-31 DIAGNOSIS — E785 Hyperlipidemia, unspecified: Secondary | ICD-10-CM

## 2020-12-31 DIAGNOSIS — N951 Menopausal and female climacteric states: Secondary | ICD-10-CM

## 2020-12-31 DIAGNOSIS — R8290 Unspecified abnormal findings in urine: Secondary | ICD-10-CM

## 2020-12-31 DIAGNOSIS — E1169 Type 2 diabetes mellitus with other specified complication: Secondary | ICD-10-CM

## 2020-12-31 DIAGNOSIS — Z79899 Other long term (current) drug therapy: Secondary | ICD-10-CM

## 2020-12-31 DIAGNOSIS — E1140 Type 2 diabetes mellitus with diabetic neuropathy, unspecified: Secondary | ICD-10-CM

## 2020-12-31 DIAGNOSIS — R3915 Urgency of urination: Secondary | ICD-10-CM

## 2020-12-31 DIAGNOSIS — N182 Chronic kidney disease, stage 2 (mild): Secondary | ICD-10-CM

## 2020-12-31 MED ORDER — ACCU-CHEK AVIVA PLUS W/DEVICE KIT: PACK | 0 refills | Status: AC

## 2020-12-31 MED ORDER — ESTRADIOL 0.5 MG PO TABS: ORAL_TABLET | ORAL | 3 refills | Status: DC

## 2020-12-31 NOTE — Patient Instructions (Addendum)
° ° °  Estroven complete  Ingredients - evening primrose, black cohosh, soy isoflavones    Try estridiol 1 mg then 0.5 mg two days for a while - if doing well reduce to 0.5 mg daily. Then if doing well can reduce to alteranating 0.5 mg tab and 1/2 (0.25 mg).    Try mucinex (guaifenacin) to help thin lung secretions, drink plenty of water with this  Treat cough if severe or at night to make sure you rest - normal to cough up extra in the morning  If ANY worsening, fatigue, shortness of breath, fever/chills please send a message or call the office and leave a message.    Push water, cranberry; can do azo if uncomfortable bladder, can do [phone] mg vitamin C twice daily which may also help flush out bladder

## 2021-01-01 ENCOUNTER — Other Ambulatory Visit: Payer: Self-pay | Admitting: Adult Health

## 2021-01-01 MED ORDER — SULFAMETHOXAZOLE-TRIMETHOPRIM 800-160 MG PO TABS: 1.0000 | ORAL_TABLET | Freq: Two times a day (BID) | ORAL | 0 refills | Status: DC

## 2021-01-02 LAB — HEMOGLOBIN A1C
Hgb A1c MFr Bld: 5.8 % of total Hgb — ABNORMAL HIGH (ref <5.7)
Mean Plasma Glucose: 120 mg/dL
eAG (mmol/L): 6.6 mmol/L

## 2021-01-02 LAB — URINALYSIS, ROUTINE W REFLEX MICROSCOPIC
Bilirubin Urine: NEGATIVE
Glucose, UA: NEGATIVE
Hgb urine dipstick: NEGATIVE
Ketones, ur: NEGATIVE
Nitrite: POSITIVE — AB
Protein, ur: NEGATIVE
RBC / HPF: NONE SEEN /HPF (ref 0–2)
Specific Gravity, Urine: 1.013 (ref 1.001–1.035)
WBC, UA: >=60 /HPF — AB (ref 0–5)
pH: 6.5 (ref 5.0–8.0)

## 2021-01-02 LAB — CBC WITH DIFFERENTIAL/PLATELET
Absolute Monocytes: 531 cells/uL (ref 200–950)
Basophils Absolute: 49 cells/uL (ref 0–200)
Basophils Relative: 0.8 %
Eosinophils Absolute: 140 cells/uL (ref 15–500)
Eosinophils Relative: 2.3 %
HCT: 39.5 % (ref 35.0–45.0)
Hemoglobin: 13 g/dL (ref 11.7–15.5)
Lymphs Abs: 2044 cells/uL (ref 850–3900)
MCH: 28.4 pg (ref 27.0–33.0)
MCHC: 32.9 g/dL (ref 32.0–36.0)
MCV: 86.2 fL (ref 80.0–100.0)
MPV: 10.5 fL (ref 7.5–12.5)
Monocytes Relative: 8.7 %
Neutro Abs: 3337 cells/uL (ref 1500–7800)
Neutrophils Relative %: 54.7 %
Platelets: 267 10*3/uL (ref 140–400)
RBC: 4.58 10*6/uL (ref 3.80–5.10)
RDW: 13.5 % (ref 11.0–15.0)
Total Lymphocyte: 33.5 %
WBC: 6.1 10*3/uL (ref 3.8–10.8)

## 2021-01-02 LAB — COMPLETE METABOLIC PANEL WITH GFR
AG Ratio: 1.3 (calc) (ref 1.0–2.5)
ALT: 19 U/L (ref 6–29)
AST: 22 U/L (ref 10–35)
Albumin: 4.1 g/dL (ref 3.6–5.1)
Alkaline phosphatase (APISO): 87 U/L (ref 37–153)
BUN: 14 mg/dL (ref 7–25)
CO2: 31 mmol/L (ref 20–32)
Calcium: 9.7 mg/dL (ref 8.6–10.4)
Chloride: 102 mmol/L (ref 98–110)
Creat: 0.89 mg/dL (ref 0.60–1.00)
Globulin: 3.2 g/dL (calc) (ref 1.9–3.7)
Glucose, Bld: 103 mg/dL — ABNORMAL HIGH (ref 65–99)
Potassium: 3.8 mmol/L (ref 3.5–5.3)
Sodium: 141 mmol/L (ref 135–146)
Total Bilirubin: 0.5 mg/dL (ref 0.2–1.2)
Total Protein: 7.3 g/dL (ref 6.1–8.1)
eGFR: 67 mL/min/{1.73_m2} (ref >=60)

## 2021-01-02 LAB — URINE CULTURE
MICRO NUMBER:: 12767376
SPECIMEN QUALITY:: ADEQUATE

## 2021-01-02 LAB — LIPID PANEL
Cholesterol: 134 mg/dL (ref <200)
HDL: 41 mg/dL — ABNORMAL LOW (ref >=50)
LDL Cholesterol (Calc): 75 mg/dL (calc)
Non-HDL Cholesterol (Calc): 93 mg/dL (calc) (ref <130)
Total CHOL/HDL Ratio: 3.3 (calc) (ref <5.0)
Triglycerides: 93 mg/dL (ref <150)

## 2021-01-02 LAB — TSH: TSH: 1.06 mIU/L (ref 0.40–4.50)

## 2021-01-02 LAB — MAGNESIUM: Magnesium: 1.9 mg/dL (ref 1.5–2.5)

## 2021-01-18 ENCOUNTER — Other Ambulatory Visit: Payer: Self-pay | Admitting: Adult Health

## 2021-03-01 ENCOUNTER — Other Ambulatory Visit: Payer: Self-pay | Admitting: Adult Health

## 2021-03-13 NOTE — Patient Instructions (Signed)
Otitis Media With Effusion, Adult Otitis media with effusion (OME) is inflammation and fluid (effusion) in the middle ear without having an ear infection. The middle ear is the space behind the eardrum. The middle ear is connected to the back of the throat by a narrow tube (eustachian tube). Normally the eustachian tube drains fluid out of the middle ear. A swollen eustachian tube can become blocked and cause fluid to collect in the middle ear. OME often goes away without treatment. Sometimes OME can lead to hearing problems and recurrent acute ear infections (acute otitis media). These conditions may require treatment. What are the causes? OME is caused by a blocked eustachian tube. This can result from: Allergies. Upper respiratory infections. Enlarged adenoids. The adenoids are areas of soft tissue located high in the back of the throat, behind the nose and the roof of the mouth. They are part of the body's natural defense system (immune system). Rapid changes in pressure, like when an airplane is descending or during scuba diving. In some cases, the cause of this condition is not known. What are the signs or symptoms? Common symptoms of this condition include: A feeling of fullness in your ear. Decreased hearing in the affected ear. Fluid draining into the ear canal. Pain in the ear. In some cases, there are no symptoms. How is this diagnosed? A health care provider can diagnose OME based on signs and symptoms of the condition. Your provider will also do a physical exam to check for fluid behind the eardrum. During the exam, your health care provider will use an instrument called an otoscope to look in your ear. Your health care provider may do other tests, such as: A hearing test. A tympanogram. This is a test that shows how well the eardrum moves in response to air pressure in the ear canal. It provides a graph for your health care provider to review. A pneumatic otoscopy. This is a test  to check how your eardrum moves in response to changes in pressure. It is done by squeezing a small amount of air into the ear. How is this treated? Treatment for OME depends on the cause of the condition and the severity of symptoms. The first step is often waiting to see if the fluid drains on its own in a few weeks. Home care treatment may include: Over-the-counter pain relievers. A warm, moist cloth placed over the ear. Severe cases may require a procedure to insert tubes in the ears (tympanostomy tubes) to drain the fluid. Follow these instructions at home: Take over-the-counter and prescription medicines only as told by your health care provider. Keep all follow-up visits. Contact a health care provider if: You have pain that gets worse. Hearing in your affected ear gets worse. You have fluid draining from your ear canal. You have dizziness. You develop a fever. Get help right away if: You develop a severe headache. You completely lose hearing in the affected ear. You have bleeding from your ear canal. You have sudden and severe pain in your ear. These symptoms may represent a serious problem that is an emergency. Do not wait to see if the symptoms will go away. Get medical help right away. Call your local emergency services (911 in the [place] Do not drive yourself to the hospital. Summary Otitis media with effusion (OME) is inflammation and fluid (effusion) in the middle ear without having an ear infection. A swollen eustachian tube can become blocked and cause fluid to collect in the middle ear.  Treatment for OME depends on the cause of the condition and the severity of symptoms. Many times, treatment is not needed because the fluid drains on its own in a few weeks. Sometimes OME can lead to hearing problems and recurrent acute ear infections (acute otitis media), which may require treatment.  <><><><><><><><><><><<><><><><> Tinnitus Tinnitus refers to hearing a sound when there  is no actual source for that sound. This is often described as ringing in the ears. However, people with this condition may hear a variety of noises, in one ear or in both ears. The sounds of tinnitus can be soft, loud, or somewhere in between. Tinnitus can last for a few seconds or can be constant for days. It may go away without treatment and come back at various times. When tinnitus is constant or happens often, it can lead to other problems, such as trouble sleeping and trouble concentrating. Almost everyone experiences tinnitus at some point. Tinnitus is not the same as hearing loss. Tinnitus that is long-lasting (chronic) or comes back often (recurs) may require medical attention. What are the causes? The cause of tinnitus is often not known. In some cases, it can result from: Exposure to loud noises from machinery, music, or other sources. An object (foreign body) stuck in the ear. Earwax buildup. Drinking alcohol or caffeine. Taking certain medicines. Age-related hearing loss. It may also be caused by medical conditions such as: Ear or sinus infections. Heart diseases or high blood pressure. Allergies. Mnire's disease. Thyroid problems. Tumors. What increases the risk? The following factors may make you more likely to develop this condition: Exposure to loud noises. Age. Tinnitus is more likely in older individuals. Using alcohol or tobacco. What are the signs or symptoms? The main symptom of tinnitus is hearing a sound when there is no source for that sound. It may sound like: Buzzing. Sizzling. Ringing. Blowing air. Hissing. Whistling. Other sounds may include: Roaring. Running water. A musical note. Tapping. Humming. Symptoms may affect only one ear (unilateral) or both ears (bilateral). How is this diagnosed? Tinnitus is diagnosed based on your symptoms, your medical history, and a physical exam. Your health care provider may do a thorough hearing test (audiologic  exam) if your tinnitus: Is unilateral. Causes hearing difficulties. Lasts 6 months or longer. You may work with a health care provider who specializes in hearing disorders (audiologist). You may be asked questions about your symptoms and how they affect your daily life. You may have other tests done, such as: CT scan. MRI. An imaging test of how blood flows through your blood vessels (angiogram). How is this treated? Treating an underlying medical condition can sometimes make tinnitus go away. If your tinnitus continues, other treatments may include: Therapy and counseling to help you manage the stress of living with tinnitus. Sound generators to mask the tinnitus. These include: Tabletop sound machines that play relaxing sounds to help you fall asleep. Wearable devices that fit in your ear and play sounds or music. Acoustic neural stimulation. This involves using headphones to listen to music that contains an auditory signal. Over time, listening to this signal may change some pathways in your brain and make you less sensitive to tinnitus. This treatment is used for very severe cases when no other treatment is working. Using hearing aids or cochlear implants if your tinnitus is related to hearing loss. Hearing aids are worn in the outer ear. Cochlear implants are surgically placed in the inner ear. Follow these instructions at home: Managing symptoms  When possible, avoid being in loud places and being exposed to loud sounds. Wear hearing protection, such as earplugs, when you are exposed to loud noises. Use a white noise machine, a humidifier, or other devices to mask the sound of tinnitus. Practice techniques for reducing stress, such as meditation, yoga, or deep breathing. Work with your health care provider if you need help with managing stress. Sleep with your head slightly raised. This may reduce the impact of tinnitus. General instructions Do not use stimulants, such as nicotine,  alcohol, or caffeine. Talk with your health care provider about other stimulants to avoid. Stimulants are substances that can make you feel alert and attentive by increasing certain activities in the body (such as heart rate and blood pressure). These substances may make tinnitus worse. Take over-the-counter and prescription medicines only as told by your health care provider. Try to get plenty of sleep each night. Keep all follow-up visits. This is important. Contact a health care provider if: Your tinnitus continues for 3 weeks or longer without stopping. You develop sudden hearing loss. Your symptoms get worse or do not get better with home care. You feel you are not able to manage the stress of living with tinnitus. Get help right away if: You develop tinnitus after a head injury. You have tinnitus along with any of the following: Dizziness. Nausea and vomiting. Loss of balance. Sudden, severe headache. Vision changes. Facial weakness or weakness of arms or legs. These symptoms may represent a serious problem that is an emergency. Do not wait to see if the symptoms will go away. Get medical help right away. Call your local emergency services (911 in the [place] Do not drive yourself to the hospital. Summary Tinnitus refers to hearing a sound when there is no actual source for that sound. This is often described as ringing in the ears. Symptoms may affect only one ear (unilateral) or both ears (bilateral). Use a white noise machine, a humidifier, or other devices to mask the sound of tinnitus.

## 2021-03-13 NOTE — Progress Notes (Signed)
Future Appointments  Date Time Provider Department  03/14/2021  3:30 PM Unk Pinto, MD [facility]  04/28/2021 10:00 AM Unk Pinto, MD [facility]  08/26/2021  9:00 AM Liane Comber, NP [facility]    History of Present Illness:         The patient is a very nice 76 y.o. MBF with HTN, HLD, T2_NIDDM  and Vitamin D Deficiency presenting with c/o  intermittent tinnitus for several years which she describes as a "hum" or occasionally high pitched or sometimes a clicking sound. Today she also relates sensation of ears feeling full or stopped up with sounds dampened.    Medications     estradiol 0.5 MG tablet, Take  1 tablet  Daily   metFORMIN-XR 500 MG  Take 1 tablet daily with Meals    atorvastatin 80 MG tablet, TAKE 1 TABLET DAILY    enalapril 20 MG tablet, Take 1 tablet daily    hydrochlorothiazide 25 MG tablet, Take  1 tablet  Daily     cetirizine 10 MG tablet, TAKE 1 TABLET DAILY AS NEEDED    aspirin 81 MG  tablet,  every other day.   VITAMIN D   Take 10,000 units M,W,F and 5000 units all other days   ANUSOL-HC 25 MG suppository, Place 1 suppository rectally 2  times daily as needed    KRILL OIL , Take  daily   MAGNESIUM 500 mg , Take daily.    Multiple Vitamins-Minerals, Take 1 tablet daily.   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   Observations/Objective:  BP 120/74    Pulse 89    Temp 97.9 F (36.6 C)    Resp 16    Ht 5\' 4"  (1.626 m)    Wt 150 lb 9.6 oz (68.3 kg)    SpO2 94%    BMI 25.85 kg/m   HEENT - EACs patent . TMs appear retracted & dull. N/O/P - clear.  Neck - supple.  Chest - Clear equal BS. Cor - Nl HS. RRR w/o sig MGR. PP 1(+). No edema. MS- FROM w/o deformities.  Gait Nl. Neuro -  Nl w/o focal abnormalities.   Assessment and Plan:  1. Bilateral chronic serous otitis media  - pseudoephedrine 120 MG 12 hr tablet;  Take  1 tablet  2 x /day (every 12 hours)   Dispense: 60 tablet; Refill: 3  - dexamethasone  4 MG tablet;  Take 1 tab 3 x day - 3 days, then 2 x day - 3 days, then 1 tab daily   Dispense: 20 tablet   2. Tinnitus of both ears  - pseudoephedrine  120 MG 12 hr tablet;  Take  1 tablet  2 x /day (every 12 hours)   Dispense: 60 tablet; Refill: 3   Follow Up Instructions:        I discussed the  assessment and treatment plan with the patient. The patient was provided an opportunity to ask questions and all were answered. The patient agreed with the plan and demonstrated an understanding of the instructions.       The patient was advised to call back or seek an in-person evaluation if the symptoms worsen or if the condition fails to improve as anticipated.    Kirtland Bouchard, MD

## 2021-03-14 ENCOUNTER — Other Ambulatory Visit: Payer: Self-pay

## 2021-03-14 ENCOUNTER — Ambulatory Visit (INDEPENDENT_AMBULATORY_CARE_PROVIDER_SITE_OTHER): Payer: Medicare Other | Admitting: Internal Medicine

## 2021-03-14 ENCOUNTER — Encounter: Payer: Self-pay | Admitting: Internal Medicine

## 2021-03-14 VITALS — BP 120/74 | HR 89 | Temp 97.9°F | Resp 16 | Ht 64.0 in | Wt 150.6 lb

## 2021-03-14 DIAGNOSIS — H9313 Tinnitus, bilateral: Secondary | ICD-10-CM

## 2021-03-14 DIAGNOSIS — H6523 Chronic serous otitis media, bilateral: Secondary | ICD-10-CM

## 2021-03-14 MED ORDER — PSEUDOEPHEDRINE HCL ER 120 MG PO TB12: ORAL_TABLET | ORAL | 3 refills | Status: AC

## 2021-03-14 MED ORDER — DEXAMETHASONE 4 MG PO TABS: ORAL_TABLET | ORAL | 0 refills | Status: DC

## 2021-03-28 ENCOUNTER — Other Ambulatory Visit: Payer: Self-pay | Admitting: Adult Health Nurse Practitioner

## 2021-03-28 DIAGNOSIS — E1169 Type 2 diabetes mellitus with other specified complication: Secondary | ICD-10-CM

## 2021-03-28 DIAGNOSIS — E785 Hyperlipidemia, unspecified: Secondary | ICD-10-CM

## 2021-04-04 ENCOUNTER — Other Ambulatory Visit: Payer: Self-pay | Admitting: Adult Health

## 2021-04-18 ENCOUNTER — Other Ambulatory Visit: Payer: Self-pay | Admitting: Nurse Practitioner

## 2021-04-22 ENCOUNTER — Encounter: Payer: Medicare Other | Admitting: Adult Health

## 2021-04-27 ENCOUNTER — Encounter: Payer: Self-pay | Admitting: Internal Medicine

## 2021-04-27 NOTE — Progress Notes (Signed)
? ?Annual Screening/Preventative Visit ?& Comprehensive Evaluation &  Examination ? ?Future Appointments  ?Date Time Provider Department  ?04/28/2021         CPE 10:00 AM Unk Pinto, MD [facility]  ?08/26/2021        Wellness  9:00 AM Liane Comber, NP [facility]  ?05/03/2022        CPE  11:00 AM Unk Pinto, MD [facility]  ? ? ?    [name] presents for a Screening /Preventative Visit & comprehensive evaluation and management of multiple medical co-morbidities.  Patient has been followed for HTN, HLD, T2_NIDDM and Vitamin D Deficiency. ? ? ?     HTN predates circa 1995. Patient's BP has been controlled at home and patient denies any cardiac symptoms as chest pain, palpitations, shortness of breath, dizziness or ankle swelling. Today's BP is at goal - 120/72 . ? ? ?    Patient's hyperlipidemia is controlled with diet and Atorvastatin. Patient denies myalgias or other medication SE's. Last lipids were at goal : ? ?Lab Results  ?Component Value Date  ? CHOL 134 12/31/2020  ? HDL 41 (L) 12/31/2020  ? [place] 75 12/31/2020  ? TRIG 93 12/31/2020  ? CHOLHDL 3.3 12/31/2020  ? ? ? ?    Patient has  hx/o  prediabetes (A1c 6.0% /2011)  and then T2_NIDDM  (A1c 7.4% /2016) w/CKD2 (GFR 67)  controlled on Metformin.   Patient denies reactive hypoglycemic symptoms, visual blurring, diabetic polys or paresthesias. Last A1c was near goal : ? ?Lab Results  ?Component Value Date  ? HGBA1C 5.8 (H) 12/31/2020  ? ? ? ?    Finally, patient has history of Vitamin D Deficiency  ("43"/2008) and last Vitamin D was at goal : ? ?Lab Results  ?Component Value Date  ? VD25OH 96 04/22/2020  ? ? ? ?Current Outpatient Medications on File Prior to Visit  ?Medication Sig  ? aspirin 81 MG chewable tablet Chew every other day.  ? atorvastatin  80 MG tablet Take  1 tablet  Daily   ? cetirizine 10 MG tablet TAKE 1 TABLET DAILY AS NEEDED   ? VITAMIN D 5,000 Units  Take 10,000 units M,W,F and 5000 units other 4  days  ?  enalapril 20 MG tablet Take 1 tablet daily   ? estradiol  0.5 MG tablet Take  1 tablet  Daily  ? hydrochlorothiazide 25 MG tablet Take  1 tablet  Daily   ? KRILL OIL  Take daily.  ? MAGNESIUM 500 mg  Take  daily.   ? metFORMIN-XR)500 MG 2 Take 1 tablet daily   ? Multiple Vitamins-Minerals  Take 1 tablet  daily.  ? omeprazole  20 MG capsule Take 1 capsule daily  as needed  ? pseudoephedrine 120 MG 12 hr tablet Take  1 tablet  2 x /day   ? VESICARE 5 MG tablet Take  1 tablet  Daily for  ? ? ?No Known Allergies ? ? ?Past Medical History:  ?Diagnosis Date  ? Allergy   ? Cataract   ? bilateral lens implants  ? GERD (gastroesophageal reflux disease)   ? Hyperlipidemia   ? Hypertension   ? Prediabetes   ? Vitamin D deficiency   ? ? ? ?Health Maintenance  ?Topic Date Due  ? Zoster Vaccines- Shingrix (1 of 2) Never done  ? OPHTHALMOLOGY EXAM  11/21/2019  ? COVID-19 Vaccine (4 - Booster for Pfizer series) 12/24/2019  ? FOOT EXAM  04/22/2021  ?  HEMOGLOBIN A1C  07/01/2021  ? INFLUENZA VACCINE  08/16/2021  ? TETANUS/TDAP  08/10/2022  ? Pneumonia Vaccine 42+ Years old  Completed  ? DEXA SCAN  Completed  ? Hepatitis C Screening  Completed  ? HPV VACCINES  Aged Out  ? ? ? ?Immunization History  ?Administered Date(s) Administered  ? Influenza Split 10/31/2012  ? Influenza, High Dose  09/13/2016, 11/08/2017  ? Influenza 09/26/2014, 10/06/2015, 09/04/2018  ? PFIZER SARS-COV-2 Vacc 03/09/2019, 04/02/2019, 10/29/2019  ? Pneumococcal -13 03/09/2014  ? Pneumococcal 23 08/06/2015  ? Td 08/09/2012  ? Zoster, Live 08/14/2012  ? ? ?Last Colon - 05/27/2020 -  Dr Loletha Carrow - Recc no f/u due to age ? ? ?Last MGM - 12/24/2020 ? ? ?Past Surgical History:  ?Procedure Laterality Date  ? ABDOMINAL HYSTERECTOMY Bilateral 1977  ? bso  ? COLONOSCOPY  05/27/2020  ? 2012-2 TA's  ? KNEE SURGERY Left 09/2009  ? had ORIF and then hardware removed; Dr. Marcelino Scot  ? ? ? ?Family History  ?Problem Relation Age of Onset  ? Heart disease Mother   ? Ulcers Father   ? Heart  disease Brother   ? Hypertension Brother   ? Kidney disease Brother   ? Colon polyps Brother   ? Colon polyps Sister   ? Stroke Maternal Grandmother   ? Colon cancer Neg Hx   ? Esophageal cancer Neg Hx   ? Stomach cancer Neg Hx   ? Rectal cancer Neg Hx   ? ? ? ?Social History  ? ?Tobacco Use  ? Smoking status: Former  ?  Packs/day: 0.75  ?  Years: 34.00  ?  Pack years: 25.50  ?  Types: Cigarettes  ?  Start date: 44  ?  Quit date: 01/16/1994  ?  Years since quitting: 27.2  ? Smokeless tobacco: Never  ?Vaping Use  ? Vaping Use: Never used  ?Substance Use Topics  ? Alcohol use: Yes  ?  Comment: occasional  ? Drug use: Never  ? ? ? ? ROS ?Constitutional: Denies fever, chills, weight loss/gain, headaches, insomnia,  night sweats, and change in appetite. Does c/o fatigue. ?Eyes: Denies redness, blurred vision, diplopia, discharge, itchy, watery eyes.  ?ENT: Denies discharge, congestion, post nasal drip, epistaxis, sore throat, earache, hearing loss, dental pain, Tinnitus, Vertigo, Sinus pain, snoring.  ?Cardio: Denies chest pain, palpitations, irregular heartbeat, syncope, dyspnea, diaphoresis, orthopnea, PND, claudication, edema ?Respiratory: denies cough, dyspnea, DOE, pleurisy, hoarseness, laryngitis, wheezing.  ?Gastrointestinal: Denies dysphagia, heartburn, reflux, water brash, pain, cramps, nausea, vomiting, bloating, diarrhea, constipation, hematemesis, melena, hematochezia, jaundice, hemorrhoids ?Genitourinary: Denies dysuria, frequency, urgency, nocturia, hesitancy, discharge, hematuria, flank pain ?Breast: Breast lumps, nipple discharge, bleeding.  ?Musculoskeletal: Denies arthralgia, myalgia, stiffness, Jt. Swelling, pain, limp, and strain/sprain. Denies falls. ?Skin: Denies puritis, rash, hives, warts, acne, eczema, changing in skin lesion ?Neuro: No weakness, tremor, incoordination, spasms, paresthesia, pain ?Psychiatric: Denies confusion, memory loss, sensory loss. Denies Depression. ?Endocrine: Denies  change in weight, skin, hair change, nocturia, and paresthesia, diabetic polys, visual blurring, hyper / hypo glycemic episodes.  ?Heme/Lymph: No excessive bleeding, bruising, enlarged lymph nodes. ? ?Physical Exam ? ?BP 120/72   Pulse 80   Temp 97.9 ?F (36.6 ?C)   Resp 16   Ht '5\' 4"'$  (1.626 m)   Wt 149 lb 6.4 oz (67.8 kg)   SpO2 96%   BMI 25.64 kg/m?  ? ?General Appearance: Well nourished, well groomed and in no apparent distress. ? ?Eyes: PERRLA, EOMs, conjunctiva no swelling or erythema, normal  fundi and vessels. ?Sinuses: No frontal/maxillary tenderness ?ENT/Mouth: EACs patent / TMs  nl. Nares clear without erythema, swelling, mucoid exudates. Oral hygiene is good. No erythema, swelling, or exudate. Tongue normal, non-obstructing. Tonsils not swollen or erythematous. Hearing normal.  ?Neck: Supple, thyroid not palpable. No bruits, nodes or JVD. ?Respiratory: Respiratory effort normal.  BS equal and clear bilateral without rales, rhonci, wheezing or stridor. ?Cardio: Heart sounds are normal with regular rate and rhythm and no murmurs, rubs or gallops. Peripheral pulses are normal and equal bilaterally without edema. No aortic or femoral bruits. ?Chest: symmetric with normal excursions and percussion. ?Breasts: Symmetric, without lumps, nipple discharge, retractions, or fibrocystic changes.  ?Abdomen: Flat, soft with bowel sounds active. Nontender, no guarding, rebound, hernias, masses, or organomegaly.  ?Lymphatics: Non tender without lymphadenopathy.  ?Musculoskeletal: Full ROM all peripheral extremities, joint stability, 5/5 strength, and normal gait. ?Skin: Warm and dry without rashes, lesions, cyanosis, clubbing or  ecchymosis.  ?Neuro: Cranial nerves intact, reflexes equal bilaterally. Normal muscle tone, no cerebellar symptoms. Sensation intact.  ?Pysch: Alert and oriented X 3, normal affect, Insight and Judgment appropriate.  ? ? ?Assessment and Plan ? ?1. Annual Preventative Screening  Examination ? ? ?2. Essential hypertension ? ?- EKG 12-Lead ?- Urinalysis, Routine w reflex microscopic ?- Microalbumin / creatinine urine ratio ?- CBC with Differential/Platelet ?- COMPLETE METABOLIC PANEL WITH GFR ?- Magnesium ?- T

## 2021-04-27 NOTE — Patient Instructions (Signed)

## 2021-04-28 ENCOUNTER — Encounter: Payer: Self-pay | Admitting: Internal Medicine

## 2021-04-28 ENCOUNTER — Ambulatory Visit (INDEPENDENT_AMBULATORY_CARE_PROVIDER_SITE_OTHER): Payer: Medicare Other | Admitting: Internal Medicine

## 2021-04-28 VITALS — BP 120/72 | HR 80 | Temp 97.9°F | Resp 16 | Ht 64.0 in | Wt 149.4 lb

## 2021-04-28 DIAGNOSIS — Z0000 Encounter for general adult medical examination without abnormal findings: Secondary | ICD-10-CM

## 2021-04-28 DIAGNOSIS — Z87891 Personal history of nicotine dependence: Secondary | ICD-10-CM

## 2021-04-28 DIAGNOSIS — E559 Vitamin D deficiency, unspecified: Secondary | ICD-10-CM

## 2021-04-28 DIAGNOSIS — E1169 Type 2 diabetes mellitus with other specified complication: Secondary | ICD-10-CM

## 2021-04-28 DIAGNOSIS — Z136 Encounter for screening for cardiovascular disorders: Secondary | ICD-10-CM | POA: Diagnosis not present

## 2021-04-28 DIAGNOSIS — E1122 Type 2 diabetes mellitus with diabetic chronic kidney disease: Secondary | ICD-10-CM

## 2021-04-28 DIAGNOSIS — Z0001 Encounter for general adult medical examination with abnormal findings: Secondary | ICD-10-CM

## 2021-04-28 DIAGNOSIS — Z79899 Other long term (current) drug therapy: Secondary | ICD-10-CM

## 2021-04-28 DIAGNOSIS — Z8249 Family history of ischemic heart disease and other diseases of the circulatory system: Secondary | ICD-10-CM

## 2021-04-28 DIAGNOSIS — I10 Essential (primary) hypertension: Secondary | ICD-10-CM | POA: Diagnosis not present

## 2021-04-28 DIAGNOSIS — Z1211 Encounter for screening for malignant neoplasm of colon: Secondary | ICD-10-CM

## 2021-04-29 LAB — COMPLETE METABOLIC PANEL WITH GFR
AG Ratio: 1.3 (calc) (ref 1.0–2.5)
ALT: 18 U/L (ref 6–29)
AST: 24 U/L (ref 10–35)
Albumin: 4 g/dL (ref 3.6–5.1)
Alkaline phosphatase (APISO): 71 U/L (ref 37–153)
BUN: 11 mg/dL (ref 7–25)
CO2: 28 mmol/L (ref 20–32)
Calcium: 9.9 mg/dL (ref 8.6–10.4)
Chloride: 104 mmol/L (ref 98–110)
Creat: 0.81 mg/dL (ref 0.60–1.00)
Globulin: 3 g/dL (calc) (ref 1.9–3.7)
Glucose, Bld: 98 mg/dL (ref 65–99)
Potassium: 3.9 mmol/L (ref 3.5–5.3)
Sodium: 143 mmol/L (ref 135–146)
Total Bilirubin: 0.6 mg/dL (ref 0.2–1.2)
Total Protein: 7 g/dL (ref 6.1–8.1)
eGFR: 75 mL/min/{1.73_m2} (ref >=60)

## 2021-04-29 LAB — URINALYSIS, ROUTINE W REFLEX MICROSCOPIC
Bilirubin Urine: NEGATIVE
Glucose, UA: NEGATIVE
Hgb urine dipstick: NEGATIVE
Ketones, ur: NEGATIVE
Leukocytes,Ua: NEGATIVE
Nitrite: NEGATIVE
Protein, ur: NEGATIVE
Specific Gravity, Urine: 1.011 (ref 1.001–1.035)
pH: 7 (ref 5.0–8.0)

## 2021-04-29 LAB — CBC WITH DIFFERENTIAL/PLATELET
Absolute Monocytes: 599 cells/uL (ref 200–950)
Basophils Absolute: 62 cells/uL (ref 0–200)
Basophils Relative: 1.1 %
Eosinophils Absolute: 67 cells/uL (ref 15–500)
Eosinophils Relative: 1.2 %
HCT: 40.2 % (ref 35.0–45.0)
Hemoglobin: 12.9 g/dL (ref 11.7–15.5)
Lymphs Abs: 1971 cells/uL (ref 850–3900)
MCH: 27.9 pg (ref 27.0–33.0)
MCHC: 32.1 g/dL (ref 32.0–36.0)
MCV: 87 fL (ref 80.0–100.0)
MPV: 10.9 fL (ref 7.5–12.5)
Monocytes Relative: 10.7 %
Neutro Abs: 2901 cells/uL (ref 1500–7800)
Neutrophils Relative %: 51.8 %
Platelets: 296 10*3/uL (ref 140–400)
RBC: 4.62 10*6/uL (ref 3.80–5.10)
RDW: 14.1 % (ref 11.0–15.0)
Total Lymphocyte: 35.2 %
WBC: 5.6 10*3/uL (ref 3.8–10.8)

## 2021-04-29 LAB — LIPID PANEL
Cholesterol: 141 mg/dL (ref <200)
HDL: 49 mg/dL — ABNORMAL LOW (ref >=50)
LDL Cholesterol (Calc): 72 mg/dL (calc)
Non-HDL Cholesterol (Calc): 92 mg/dL (calc) (ref <130)
Total CHOL/HDL Ratio: 2.9 (calc) (ref <5.0)
Triglycerides: 121 mg/dL (ref <150)

## 2021-04-29 LAB — INSULIN, RANDOM: Insulin: 9.3 u[IU]/mL

## 2021-04-29 LAB — VITAMIN D 25 HYDROXY (VIT D DEFICIENCY, FRACTURES): Vit D, 25-Hydroxy: 93 ng/mL (ref 30–100)

## 2021-04-29 LAB — MICROALBUMIN / CREATININE URINE RATIO
Creatinine, Urine: 50 mg/dL (ref 20–275)
Microalb Creat Ratio: 24 mcg/mg creat (ref <30)
Microalb, Ur: 1.2 mg/dL

## 2021-04-29 LAB — TSH: TSH: 2.67 mIU/L (ref 0.40–4.50)

## 2021-04-29 LAB — HEMOGLOBIN A1C
Hgb A1c MFr Bld: 6 % of total Hgb — ABNORMAL HIGH (ref <5.7)
Mean Plasma Glucose: 126 mg/dL
eAG (mmol/L): 7 mmol/L

## 2021-04-29 LAB — MAGNESIUM: Magnesium: 1.8 mg/dL (ref 1.5–2.5)

## 2021-04-29 NOTE — Progress Notes (Signed)
<><><><><><><><><><><><><><><><><><><><><><><><><><><><><><><><><> ?<><><><><><><><><><><><><><><><><><><><><><><><><><><><><><><><><> ?-   Test results slightly outside the reference range are not unusual. ?If there is anything important, I will review this with you,  ?otherwise it is considered normal test values.  ?If you have further questions,  ?please do not hesitate to contact me at the office or via My Chart.  ?<><><><><><><><><><><><><><><><><><><><><><><><><><><><><><><><><> ? ?- Total Chol = 141     &   LDL Chol = 72     - Bioth   Excellent  ? ?- Very low risk for Heart Attack  / Stroke ?============================================================ ? ?-  A1c = 6.0%    slightly  elevated  -   ? ?- Avoid Sweets, Candy & White Stuff  ? ?- White Rice, White Potatoes, White Flour  - Breads &  Pasta ?<><><><><><><><><><><><><><><><><><><><><><><><><><><><><><><><><> ? ?-  Magnesium  -  1.8    -  very  low   -         goal is betw 2.0 - 2.5,  ? ?- So..............Marland Kitchen  Recommend that you INCREASE your  ?Magnesium 500 mg tablet to 2 x /day  ? ?- also important to eat lots of  leafy green vegetables  ? ?- spinach - Kale - collards - greens - okra - asparagus  ?- broccoli - quinoa - squash - almonds  ? ?- black, red, white beans ? ?-  peas - green beans ?<><><><><><><><><><><><><><><><><><><><><><><><><><><><><><><><><> ? ?- Vitamin D = 93   - Excellent   - Please keep dose same  ? ?<><><><><><><><><><><><><><><><><><><><><><><><><><><><><><><><><> ? ?- All Else - CBC - Kidneys - U/A - Electrolytes - Liver - Magnesium & Thyroid   ? ?- all  Normal / OK ?<><><><><><><><><><><><><><><><><><><><><><><><><><><><><><><><><> ? ?- Keep up the [place] Work   ! ? ?<><><><><><><><><><><><><><><><><><><><><><><><><><><><><><><><><> ? ? ? ? ? ? ? ? ? ? ? ? ? ? ? ? ? ? ? ? ? ? ? ? ?

## 2021-04-30 ENCOUNTER — Encounter: Payer: Self-pay | Admitting: Internal Medicine

## 2021-05-02 ENCOUNTER — Other Ambulatory Visit: Payer: Self-pay | Admitting: Nurse Practitioner

## 2021-06-02 ENCOUNTER — Other Ambulatory Visit: Payer: Self-pay | Admitting: Internal Medicine

## 2021-06-02 MED ORDER — TIRZEPATIDE 2.5 MG/0.5ML ~~LOC~~ SOAJ: 2.5000 mg | SUBCUTANEOUS | 0 refills | Status: DC

## 2021-06-07 ENCOUNTER — Telehealth (INDEPENDENT_AMBULATORY_CARE_PROVIDER_SITE_OTHER): Payer: Medicare Other

## 2021-06-07 DIAGNOSIS — Z1211 Encounter for screening for malignant neoplasm of colon: Secondary | ICD-10-CM | POA: Diagnosis not present

## 2021-06-07 DIAGNOSIS — Z1212 Encounter for screening for malignant neoplasm of rectum: Secondary | ICD-10-CM | POA: Diagnosis not present

## 2021-06-07 LAB — POC HEMOCCULT BLD/STL (HOME/3-CARD/SCREEN)
Card #2 Fecal Occult Blod, POC: NEGATIVE
Card #3 Fecal Occult Blood, POC: NEGATIVE
Fecal Occult Blood, POC: NEGATIVE

## 2021-06-07 NOTE — Telephone Encounter (Signed)
Hemoccult

## 2021-06-20 ENCOUNTER — Other Ambulatory Visit: Payer: Self-pay | Admitting: Internal Medicine

## 2021-06-29 ENCOUNTER — Other Ambulatory Visit: Payer: Self-pay | Admitting: Internal Medicine

## 2021-07-09 ENCOUNTER — Other Ambulatory Visit: Payer: Self-pay | Admitting: Nurse Practitioner

## 2021-07-27 ENCOUNTER — Other Ambulatory Visit: Payer: Self-pay | Admitting: Nurse Practitioner

## 2021-08-25 ENCOUNTER — Other Ambulatory Visit: Payer: Self-pay | Admitting: Nurse Practitioner

## 2021-08-25 ENCOUNTER — Other Ambulatory Visit: Payer: Self-pay | Admitting: Internal Medicine

## 2021-08-25 MED ORDER — TIRZEPATIDE 5 MG/0.5ML ~~LOC~~ SOAJ: SUBCUTANEOUS | 0 refills | Status: DC

## 2021-08-26 ENCOUNTER — Other Ambulatory Visit: Payer: Self-pay | Admitting: Internal Medicine

## 2021-08-26 ENCOUNTER — Ambulatory Visit: Payer: Medicare Other | Admitting: Adult Health

## 2021-08-26 MED ORDER — TIRZEPATIDE 5 MG/0.5ML ~~LOC~~ SOAJ: SUBCUTANEOUS | 0 refills | Status: DC

## 2021-08-30 ENCOUNTER — Ambulatory Visit (INDEPENDENT_AMBULATORY_CARE_PROVIDER_SITE_OTHER): Payer: Medicare Other | Admitting: Nurse Practitioner

## 2021-08-30 ENCOUNTER — Encounter: Payer: Self-pay | Admitting: Nurse Practitioner

## 2021-08-30 VITALS — BP 128/70 | HR 74 | Temp 97.7°F | Ht 64.0 in | Wt 151.0 lb

## 2021-08-30 DIAGNOSIS — Z6824 Body mass index (BMI) 24.0-24.9, adult: Secondary | ICD-10-CM

## 2021-08-30 DIAGNOSIS — E559 Vitamin D deficiency, unspecified: Secondary | ICD-10-CM

## 2021-08-30 DIAGNOSIS — K219 Gastro-esophageal reflux disease without esophagitis: Secondary | ICD-10-CM

## 2021-08-30 DIAGNOSIS — N200 Calculus of kidney: Secondary | ICD-10-CM

## 2021-08-30 DIAGNOSIS — Z86010 Personal history of colonic polyps: Secondary | ICD-10-CM

## 2021-08-30 DIAGNOSIS — E1169 Type 2 diabetes mellitus with other specified complication: Secondary | ICD-10-CM

## 2021-08-30 DIAGNOSIS — E0822 Diabetes mellitus due to underlying condition with diabetic chronic kidney disease: Secondary | ICD-10-CM

## 2021-08-30 DIAGNOSIS — N3281 Overactive bladder: Secondary | ICD-10-CM | POA: Diagnosis not present

## 2021-08-30 DIAGNOSIS — N182 Chronic kidney disease, stage 2 (mild): Secondary | ICD-10-CM

## 2021-08-30 DIAGNOSIS — I10 Essential (primary) hypertension: Secondary | ICD-10-CM

## 2021-08-30 DIAGNOSIS — B001 Herpesviral vesicular dermatitis: Secondary | ICD-10-CM

## 2021-08-30 DIAGNOSIS — Z0001 Encounter for general adult medical examination with abnormal findings: Secondary | ICD-10-CM

## 2021-08-30 DIAGNOSIS — R6889 Other general symptoms and signs: Secondary | ICD-10-CM

## 2021-08-30 DIAGNOSIS — Z9109 Other allergy status, other than to drugs and biological substances: Secondary | ICD-10-CM

## 2021-08-30 DIAGNOSIS — N951 Menopausal and female climacteric states: Secondary | ICD-10-CM

## 2021-08-30 DIAGNOSIS — Z860101 Personal history of adenomatous and serrated colon polyps: Secondary | ICD-10-CM

## 2021-08-30 DIAGNOSIS — Z79899 Other long term (current) drug therapy: Secondary | ICD-10-CM

## 2021-08-30 DIAGNOSIS — E785 Hyperlipidemia, unspecified: Secondary | ICD-10-CM

## 2021-08-30 DIAGNOSIS — Z13820 Encounter for screening for osteoporosis: Secondary | ICD-10-CM

## 2021-08-30 DIAGNOSIS — Z0000 Encounter for general adult medical examination without abnormal findings: Secondary | ICD-10-CM

## 2021-08-30 DIAGNOSIS — E1140 Type 2 diabetes mellitus with diabetic neuropathy, unspecified: Secondary | ICD-10-CM

## 2021-08-30 NOTE — Patient Instructions (Signed)

## 2021-08-30 NOTE — Progress Notes (Signed)
MEDICARE VISIT AND FOLLOW UP  Assessment:    Annual Medicare Wellness Visit Due annually  Health maintenance reviewed  Essential hypertension Continue Enalaprill, HCTZ Discussed DASH (Dietary Approaches to Stop Hypertension) DASH diet is lower in sodium than a typical American diet. Cut back on foods that are high in saturated fat, cholesterol, and trans fats. Eat more whole-grain foods, fish, poultry, and nuts Remain active and exercise as tolerated daily.  Monitor BP at home-Call if greater than 130/80.  Check CMP/CBC   Gastroesophageal reflux disease, esophagitis presence not specified Continue Omeprazole PRN No suspected reflux complications ([place]/stricture). Lifestyle modification:  wt loss, avoid meals 2-3h before bedtime. Consider eliminating food triggers:  chocolate, caffeine, EtOH, acid/spicy food.    OAB (overactive bladder) Continue Vesicare. Follows with Urology Continue to monitor   Nephrolithiasis Stay well hydrated Followed by Dr. Matilde Sprang PRN Continue to monitor  Vitamin D deficiency At goal at recent check; continue to recommend supplementation for goal of 70-100 Monitor Vitamin D levels  T2DM with CKD  Continue Metformin, Mounjaro Education: Reviewed 'ABCs' of diabetes management  Discussed goals to be met and/or maintained include A1C (<7) Blood pressure (<130/80) Cholesterol (LDL <70) Continue Eye Exam yearly  Continue Dental Exam Q6 mo Discussed dietary recommendations Discussed Physical Activity recommendations Foot exam UTD Check A1C Continue ACE, bASA, statin Discussed how what you eat and drink can aide in kidney protection. Stay well hydrated. Avoid high salt foods. Avoid NSAIDS. Keep BP and BG well controlled.   Take medications as prescribed. Remain active and exercise as tolerated daily. Maintain weight.  Continue to monitor. Check CMP/GFR/Microablumin   Mixed hyperlipidemia associated with T2DM (HCC) Continue  Atorvastatin Discussed lifestyle modifications. Recommended diet heavy in fruits and veggies, omega 3's. Decrease consumption of animal meats, cheeses, and dairy products. Remain active and exercise as tolerated. Continue to monitor. Check lipids/TSH   T2DM with sensory neuropathy (HCC) Monitor feet daily, declines meds  BMI 24 - normal weight Continue to recommend diet heavy in fruits and veggies and low in animal meats, cheeses, and dairy products, appropriate calorie intake Discuss exercise recommendations routinely Continue to monitor weight at each visit  Medication management All medications discussed and reviewed in full. All questions and concerns regarding medications addressed.    Recurrent cold sores Acyclovir PRN  History of adenomatous polyp of colon No further colonoscopies due to age per Dr. Loletha Carrow Denies concerning sx  High fiber plant based diet encouraged, continue soluble fiber supplement  Post menopausal hot flashes Has been on estrogen for many years; discussed risks and recommendation to taper to stop.  She is receptive to try this in the cooler months.    Environmental allergies H2 PRN Saline irrigations/flonase PRN,  Allergy hygiene explained. Follow up if not improving or with progressive sx  Orders Placed This Encounter  Procedures   DG Bone Density    Standing Status:   Future    Standing Expiration Date:   08/31/2022    Order Specific Question:   Reason for Exam (SYMPTOM  OR DIAGNOSIS REQUIRED)    Answer:   Screening for Osteoporosis    Order Specific Question:   Preferred imaging location?    Answer:   [facility]   CBC with Differential/Platelet   COMPLETE METABOLIC PANEL WITH GFR   Lipid panel   Hemoglobin A1c   VITAMIN D 25 Hydroxy (Vit-D Deficiency, Fractures)    Over 40 minutes of exam, counseling, chart review and critical decision making was performed Future Appointments  Date Time Provider [place]  12/01/2021   9:30 AM Unk Pinto, MD [facility] None  05/03/2022 11:00 AM Unk Pinto, MD [facility] None     Plan:   During the course of the visit the patient was educated and counseled about appropriate screening and preventive services including:   Pneumococcal vaccine  Prevnar 13 Influenza vaccine Td vaccine Screening electrocardiogram Bone densitometry screening Colorectal cancer screening Diabetes screening Glaucoma screening Nutrition counseling  Advanced directives: requested   Subjective:  Kaitlyn Townsend is a 76 y.o. female who presents for AWV and follow up. 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   She is married, 1 son, no grandchildren. 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   She is followed by Dr. Matilde Sprang as needed at [facility] urology for recurrent nephrolithiasis.  She follows PRN.  Denies recent flare.  She takes Vesicare for OAB is working well for her. Symptoms improved with vesicare, uses one liner per day  She takes acyclovir as needed for cold sores.   She is on estrace 1 mg daily following total hysterectomy and doing well with this. She is on bASA and she is active. She had significant hot flashes with attempt to taper continues with 1 mg daily, she is receptive to trying to taper later this year in the fall once cooler after discussion.   She has GERD on omeprazole 20 mg daily. Currently well controlled.  Takes PRN.  BMI is Body mass index is 25.92 kg/m., she has been working on diet and exercise. She was on stationary bike and doing well.  Averaging 6-8 mi/day.  Works outside in garden.  Cares for her disabled husband. Weight is down from 168-170 lb.  Wt Readings from Last 3 Encounters:  08/30/21 151 lb (68.5 kg)  04/28/21 149 lb 6.4 oz (67.8 kg)  03/14/21 150 lb 9.6 oz (68.3 kg)    Her blood pressure has been controlled at home, today their BP is BP: 128/70 She does workout. She denies chest pain, shortness of breath, dizziness.   She is on cholesterol medication (atorvastatin 80 mg daily) and denies myalgias. Her LDL cholesterol is not at goal of LDL <70 at last check. The cholesterol last visit was:   Lab Results  Component Value Date   CHOL 141 04/28/2021   HDL 49 (L) 04/28/2021   LDLCALC 72 04/28/2021   TRIG 121 04/28/2021   CHOLHDL 2.9 04/28/2021    She has been working on diet and exercise for T2DM on metformin 500 mg daily and denies foot ulcerations, increased appetite, nausea, paresthesia of the feet, polydipsia, polyuria, visual disturbances, vomiting and weight loss. She does check fasting sugars, range (80s-110s). Last A1C in the office was:  Lab Results  Component Value Date   HGBA1C 6.0 (H) 04/28/2021   Last GFR: Lab Results  Component Value Date   GFRNONAA 66 04/22/2020   Patient is on Vitamin D supplement and approaching goal at recent check, alternates 5000 IU and 10000 IU:    Lab Results  Component Value Date   VD25OH 75  04/28/2021       Medication Review:  Current Outpatient Medications (Endocrine & Metabolic):    estradiol (ESTRACE) 0.5 MG tablet, Take  1 tablet  Daily   metFORMIN (GLUCOPHAGE XR) 500 MG 24 hr tablet, Take 1 tablet daily with Meals for Diabetes   tirzepatide (MOUNJARO) 5 MG/0.5ML Pen, Inject  5 mg  into Skin  every 7 days  for Diabetes  ( Dx: e11.29)  Current Outpatient Medications (Cardiovascular):    atorvastatin (LIPITOR) 80 MG  tablet, Take  1 tablet  Daily  for Cholesterol                                                           /                   TAKE               BY      MOUTH   enalapril (VASOTEC) 20 MG tablet, Take 1 tablet by mouth once daily for blood pressure   hydrochlorothiazide (HYDRODIURIL) 25 MG tablet, TAKE 1 TABLET BY MOUTH ONCE DAILY FOR BLOOD PRESSURE AND  FLUID  RETENTION/  ANKLE  SWELLING  Current Outpatient Medications (Respiratory):    cetirizine (ZYRTEC) 10 MG tablet, TAKE 1 TABLET BY MOUTH ONCE DAILY AS NEEDED FOR ALLERGIES   pseudoephedrine (SUDAFED) 120 MG 12 hr tablet, Take  1 tablet  2 x /day (every 12 hours)  for Sinus & Chest Congestion  Current Outpatient Medications (Analgesics):    aspirin 81 MG chewable tablet, Chew by mouth every other day.   Current Outpatient Medications (Other):    blood glucose meter kit and supplies, Test sugars as directed by provider. Dispense based insurance preference. E11.22   Blood Glucose Monitoring Suppl (ACCU-CHEK AVIVA PLUS) w/Device KIT, Check blood sugar 1 time daily-DX-R73.09   Cholecalciferol (VITAMIN D PO), Take 5,000 Int'l Units by mouth daily. Take 10,000 units M,W,F and 5000 units all other days   glucose blood test strip, Use to check blood sugar once daily.   KRILL OIL PO, Take by mouth daily.   Lancets (ACCU-CHEK SOFT TOUCH) lancets, Check blood sugar 1 time daily-DX-R73.09   Multiple Vitamins-Minerals (MULTIVITAMIN WITH MINERALS) tablet, Take 1 tablet by mouth daily.   omeprazole (PRILOSEC) 20 MG capsule, Take 1 capsule (20 mg total) by mouth daily. (Patient taking differently: Take 20 mg by mouth as needed.)   solifenacin (VESICARE) 5 MG tablet, TAKE 1 TABLET BY MOUTH ONCE DAILY FOR  BLADDER  CONTROL   hydrocortisone (ANUSOL-HC) 25 MG suppository, Place 1 suppository (25 mg total) rectally 2 (two) times daily. For 7 days (Patient not taking: Reported on 08/30/2021)   MAGNESIUM PO, Take 500 mg by mouth daily.  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, without long-term current use of insulin ([place]) 11/22/2017   Former smoker (25 pack year, quit 1996) 08/21/2017   FHx: heart disease 08/21/2017  Recurrent cold sores 05/14/2017   Nephrolithiasis 02/16/2016   OAB (overactive bladder) 02/16/2016   Medication management 02/13/2013   Hyperlipidemia associated with type 2 diabetes mellitus ([place])    Essential hypertension    Vitamin D deficiency    GERD (gastroesophageal reflux disease)     Screening Tests Immunization History  Administered Date(s) Administered   Influenza Split 10/31/2012   Influenza, High Dose Seasonal PF 09/13/2016, 11/08/2017   Influenza-Unspecified 09/26/2014, 10/06/2015, 09/04/2018   PFIZER(Purple Top)SARS-COV-2 Vaccination 03/09/2019, 04/02/2019, 10/29/2019   Pneumococcal Conjugate-13 03/09/2014   Pneumococcal Polysaccharide-23 08/06/2015   Td 08/09/2012   Zoster, Live 08/14/2012   Tetanus: 2014 Influenza: Due 10/2021 Pneumonia 2/2 Shingles: zoster in 2014, declines shingrix  Covid 19: 3/3, 2021, pfizer  Preventative care: Last colonoscopy: 05/2020, Dr. Loletha Carrow, 1 adenomatous polyp, no follow up recommended due to age Mammogram:12/2020 Due 12/2021 Last pap smear/pelvic exam: remote; s/p total hysterectomy DEXA: 2019, normal   Former smoker - quit 1996, 26 pack year hx, declines sx or CXR  Names of Other Physician/Practitioners you currently use: 1. [facility] here for primary care 2. [facility], Dr, Mariea Stable, eye doctor, last visit 12/21/2020 - report  requested 3. Dr. Deatra Ina, dentist, last visit 2021, q40m Patient Care Team: MUnk Pinto MD as PCP - General (Internal Medicine) KInda Castle MD (Inactive) as Consulting Physician (Gastroenterology) MBjorn Loser MD as Consulting Physician (Urology)  SURGICAL HISTORY She  has a past surgical history that includes Knee surgery (Left, 09/2009); Abdominal hysterectomy (Bilateral, 1977); and Colonoscopy (05/27/2020). FAMILY HISTORY Her family history includes Colon polyps in her brother and sister; Heart disease in her brother and mother; Hypertension in her brother; Kidney disease in her brother; Stroke in her maternal grandmother; Ulcers in her father. SOCIAL HISTORY She  reports that she quit smoking about 27 years ago. Her smoking use included cigarettes. She started smoking about 51 years ago. She has a 25.50 pack-year smoking history. She has never used smokeless tobacco. She reports current alcohol use. She reports that she does not use drugs.  MEDICARE WELLNESS OBJECTIVES: Physical activity:   Cardiac risk factors:   Depression/mood screen:      08/30/2021   10:23 AM  Depression screen PHQ 2/9  Decreased Interest 0  Down, Depressed, Hopeless 0  PHQ - 2 Score 0    ADLs:     08/30/2021   10:02 AM 04/30/2021   10:43 PM  In your present state of health, do you have any difficulty performing the following activities:  Hearing? 0 0  Vision? 0 0  Difficulty concentrating or making decisions? 0 0  Walking or climbing stairs? 0 0  Dressing or bathing? 0 0  Doing errands, shopping? 0 0  Preparing Food and eating ? N   Using the Toilet? N   In the past six months, have you accidently leaked urine? N   Do you have problems with loss of bowel control? N   Managing your Medications? N   Managing your Finances? N   Housekeeping or managing your Housekeeping? N      Cognitive Testing  Alert? Yes  Normal Appearance?Yes  Oriented to person? Yes  Place? Yes   Time?  Yes  Recall of three objects?  Yes  Can perform simple calculations? Yes  Displays appropriate judgment?Yes  Can read the correct time from a watch face?Yes  EOL planning: Does Patient Have a Medical Advance Directive?: Yes Does patient want to make changes  to medical advance directive?: No - Patient declined  Review of Systems  Constitutional:  Negative for fever, malaise/fatigue and weight loss.  HENT:  Positive for congestion. Negative for hearing loss, sinus pain, sore throat and tinnitus.   Eyes:  Negative for blurred vision and double vision.  Respiratory:  Negative for cough, sputum production, shortness of breath and wheezing.   Cardiovascular:  Negative for chest pain, palpitations, orthopnea, claudication, leg swelling and PND.  Gastrointestinal:  Negative for abdominal pain, blood in stool, constipation, diarrhea, heartburn, melena, nausea and vomiting.  Genitourinary: Negative.   Musculoskeletal:  Negative for falls, joint pain and myalgias.  Skin:  Negative for itching and rash.  Neurological:  Negative for dizziness, tingling, sensory change, weakness and headaches.  Endo/Heme/Allergies:  Positive for environmental allergies. Negative for polydipsia.  Psychiatric/Behavioral: Negative.  Negative for depression, memory loss, substance abuse and suicidal ideas. The patient is not nervous/anxious and does not have insomnia.   All other systems reviewed and are negative.    Objective:     Today's Vitals   08/30/21 0921  BP: 128/70  Pulse: 74  Temp: 97.7 F (36.5 C)  SpO2: 98%  Weight: 151 lb (68.5 kg)  Height: 5' 4" (1.626 m)   Body mass index is 25.92 kg/m.  General appearance: alert, no distress, WD/WN, female HEENT: normocephalic, sclerae anicteric, TMs pearly, nares patent, no discharge or erythema, pharynx normal Oral cavity: MMM, no lesions Neck: supple, no lymphadenopathy, no thyromegaly, no masses Heart: RRR, normal S1, S2, no murmurs Lungs: CTA  bilaterally, no wheezes, rhonchi, or rales Abdomen: +bs, soft, non tender, non distended, no masses, no hepatomegaly, no splenomegaly Musculoskeletal: nontender, no swelling, no obvious deformity Extremities: no edema, no cyanosis, no clubbing Pulses: 2+ symmetric, upper and lower extremities, normal cap refill Neurological: alert, oriented x 3, CN2-12 intact, strength normal upper extremities and lower extremities, sensation normal throughout, DTRs 2+ throughout, no cerebellar signs, gait normal Psychiatric: normal affect, behavior normal, pleasant    Medicare Attestation I have personally reviewed: The patient's medical and social history Their use of alcohol, tobacco or illicit drugs Their current medications and supplements The patient's functional ability including ADLs,fall risks, home safety risks, cognitive, and hearing and visual impairment Diet and physical activities Evidence for depression or mood disorders  The patient's weight, height, BMI, and visual acuity have been recorded in the chart.  I have made referrals, counseling, and provided education to the patient based on review of the above and I have provided the patient with a written personalized care plan for preventive services.     Darrol Jump, NP   08/30/2021

## 2021-08-31 LAB — CBC WITH DIFFERENTIAL/PLATELET
Absolute Monocytes: 570 cells/uL (ref 200–950)
Basophils Absolute: 50 cells/uL (ref 0–200)
Basophils Relative: 1 %
Eosinophils Absolute: 140 cells/uL (ref 15–500)
Eosinophils Relative: 2.8 %
HCT: 38.7 % (ref 35.0–45.0)
Hemoglobin: 13.1 g/dL (ref 11.7–15.5)
Lymphs Abs: 1795 cells/uL (ref 850–3900)
MCH: 28.4 pg (ref 27.0–33.0)
MCHC: 33.9 g/dL (ref 32.0–36.0)
MCV: 83.9 fL (ref 80.0–100.0)
MPV: 10.7 fL (ref 7.5–12.5)
Monocytes Relative: 11.4 %
Neutro Abs: 2445 cells/uL (ref 1500–7800)
Neutrophils Relative %: 48.9 %
Platelets: 238 10*3/uL (ref 140–400)
RBC: 4.61 10*6/uL (ref 3.80–5.10)
RDW: 14.2 % (ref 11.0–15.0)
Total Lymphocyte: 35.9 %
WBC: 5 10*3/uL (ref 3.8–10.8)

## 2021-08-31 LAB — COMPLETE METABOLIC PANEL WITH GFR
AG Ratio: 1.5 (calc) (ref 1.0–2.5)
ALT: 32 U/L — ABNORMAL HIGH (ref 6–29)
AST: 33 U/L (ref 10–35)
Albumin: 4 g/dL (ref 3.6–5.1)
Alkaline phosphatase (APISO): 75 U/L (ref 37–153)
BUN: 14 mg/dL (ref 7–25)
CO2: 27 mmol/L (ref 20–32)
Calcium: 9.4 mg/dL (ref 8.6–10.4)
Chloride: 108 mmol/L (ref 98–110)
Creat: 0.91 mg/dL (ref 0.60–1.00)
Globulin: 2.7 g/dL (calc) (ref 1.9–3.7)
Glucose, Bld: 87 mg/dL (ref 65–99)
Potassium: 3.9 mmol/L (ref 3.5–5.3)
Sodium: 143 mmol/L (ref 135–146)
Total Bilirubin: 0.5 mg/dL (ref 0.2–1.2)
Total Protein: 6.7 g/dL (ref 6.1–8.1)
eGFR: 65 mL/min/{1.73_m2} (ref >=60)

## 2021-08-31 LAB — LIPID PANEL
Cholesterol: 130 mg/dL (ref <200)
HDL: 46 mg/dL — ABNORMAL LOW (ref >=50)
LDL Cholesterol (Calc): 64 mg/dL (calc)
Non-HDL Cholesterol (Calc): 84 mg/dL (calc) (ref <130)
Total CHOL/HDL Ratio: 2.8 (calc) (ref <5.0)
Triglycerides: 112 mg/dL (ref <150)

## 2021-08-31 LAB — VITAMIN D 25 HYDROXY (VIT D DEFICIENCY, FRACTURES): Vit D, 25-Hydroxy: 87 ng/mL (ref 30–100)

## 2021-08-31 LAB — HEMOGLOBIN A1C
Hgb A1c MFr Bld: 5.7 % of total Hgb — ABNORMAL HIGH (ref <5.7)
Mean Plasma Glucose: 117 mg/dL
eAG (mmol/L): 6.5 mmol/L

## 2021-09-19 ENCOUNTER — Other Ambulatory Visit: Payer: Self-pay | Admitting: Nurse Practitioner

## 2021-10-02 ENCOUNTER — Other Ambulatory Visit: Payer: Self-pay | Admitting: Nurse Practitioner

## 2021-10-06 ENCOUNTER — Other Ambulatory Visit: Payer: Self-pay | Admitting: Internal Medicine

## 2021-11-09 ENCOUNTER — Other Ambulatory Visit: Payer: Self-pay | Admitting: Nurse Practitioner

## 2021-11-11 ENCOUNTER — Other Ambulatory Visit: Payer: Self-pay | Admitting: Internal Medicine

## 2021-11-11 DIAGNOSIS — Z1231 Encounter for screening mammogram for malignant neoplasm of breast: Secondary | ICD-10-CM

## 2021-11-30 NOTE — Patient Instructions (Signed)

## 2021-11-30 NOTE — Progress Notes (Signed)
Future Appointments  Date Time Provider Department  12/01/2021  9:30 AM Unk Pinto, MD [facility]  05/03/2022 11:00 AM Unk Pinto, MD [facility]    History of Present Illness:       This very nice 76 y.o.  DBF presents for 6 month follow up with HTN, HLD, T2_NIDDM and Vitamin D Deficiency.       Patient is treated for HTN (1995) & BP has been controlled at home. Today's BP is at goal -   122/76 .   Patient has had no complaints of any cardiac type chest pain, palpitations, dyspnea / orthopnea / PND, dizziness, claudication, or dependent edema.        Hyperlipidemia is controlled with diet & meds. Patient denies myalgias or other med SE's. Last Lipids were at goal:  Lab Results  Component Value Date   CHOL 130 08/30/2021   HDL 46 (L) 08/30/2021   LDLCALC 64 08/30/2021   TRIG 112 08/30/2021   CHOLHDL 2.8 08/30/2021     Also, the patient has history of Pre_DM (A1c 6.0% /2011) and then T2_NIDDM (A1c 7.4% /June 2016) and she 's had CKD3 (GFR 78) in  Mar 2021. She has had no symptoms of reactive hypoglycemia, diabetic polys, paresthesias or visual blurring.  Patient has lost  27# weight via better eating from 166# in Aug 2019 to current wt 146 #.  She has tapered her Metformin to 1 tablet /Daily.  Last A1c was at goal:  Lab Results  Component Value Date   HGBA1C 5.7 (H) 08/30/2021   Wt Readings from Last 3 Encounters:  12/01/21 146 lb 6.4 oz (66.4 kg)  08/30/21 151 lb (68.5 kg)  04/28/21 149 lb 6.4 oz (67.8 kg)            Further, the patient also has history of Vitamin D Deficiency and supplements vitamin D without any suspected side-effects. Last vitamin D was at goal: \  Lab Results  Component Value Date   VD25OH 87 08/30/2021       Current Outpatient Medications  Medication Instructions   aspirin 81 MG chewable tablet Every other day   atorvastatin (LIPITOR) 80 MG tablet Take  1 tablet  Daily    cetirizine (ZYRTEC) 10 MG tablet TAKE 1 TABLET   DAILY AS NEEDED     VITAMIN D  5,000 Units   Daily, Take 10,000 units M,W,F and 5000 units other days   enalapril 20 MG tablet Take  1 tablet  Daily    estradiol 0.5 MG tablet Take  1 tablet  Daily   hctz 25 MG tablet TAKE 1 TABLET  DAILY    ANUSOL-HC 25 mg supp Rectal, 2 times daily, For 7 days   KRILL OIL PO  Daily   MAGNESIUM PO 500 mg, Daily   metFORMIN  XL 500 MG  Take 1 tablet daily with Meals for Diabetes   MOUNJARO 5 MG/0.5ML Pen INJECT 1 DOSE ONCE A WEEK FOR DIABETES   Multiple Vitamins-Minerals  1 tablet Daily   omeprazole20 mg    Daily   pseudoephedrine  120 MG 12 hr tablet Take  1 tablet  2 x /day (every 12 hours)     solifenacin  MG tablet TAKE 1 TABLET  DAILY FOR     No Known Allergies    PMHx:   Past Medical History:  Diagnosis Date   Allergy    GERD (gastroesophageal reflux disease)    Hyperlipidemia  Hypertension    Prediabetes    Vitamin D deficiency      Immunization History  Administered Date(s) Administered   Influenza Split 10/31/2012   Influenza, High Dose  09/13/2016, 11/08/2017   Influenza 09/26/2014, 10/06/2015, 09/04/2018   PFIZER SARS-COV-2 Vacc 03/09/2019, 04/02/2019   Pneumococcal - 13 03/09/2014   Pneumococcal - 23 08/06/2015   Td 08/09/2012   Zoster 08/14/2012     Past Surgical History:  Procedure Laterality Date   ABDOMINAL HYSTERECTOMY / BSO Bilateral 1977   KNEE SURGERY Left 09/2009     FHx:    Reviewed / unchanged   SHx:    Reviewed / unchanged     Systems Review:  Constitutional: Denies fever, chills, wt changes, headaches, insomnia, fatigue, night sweats, change in appetite. Eyes: Denies redness, blurred vision, diplopia, discharge, itchy, watery eyes.  ENT: Denies discharge, congestion, post nasal drip, epistaxis, sore throat, earache, hearing loss, dental pain, tinnitus, vertigo, sinus pain, snoring.  CV: Denies chest pain, palpitations, irregular heartbeat, syncope, dyspnea, diaphoresis, orthopnea, PND,  claudication or edema. Respiratory: denies cough, dyspnea, DOE, pleurisy, hoarseness, laryngitis, wheezing.  Gastrointestinal: Denies dysphagia, odynophagia, heartburn, reflux, water brash, abdominal pain or cramps, nausea, vomiting, bloating, diarrhea, constipation, hematemesis, melena, hematochezia  or hemorrhoids. Genitourinary: Denies dysuria, frequency, urgency, nocturia, hesitancy, discharge, hematuria or flank pain. Musculoskeletal: Denies arthralgias, myalgias, stiffness, jt. swelling, pain, limping or strain/sprain.  Skin: Denies pruritus, rash, hives, warts, acne, eczema or change in skin lesion(s). Neuro: No weakness, tremor, incoordination, spasms, paresthesia or pain. Psychiatric: Denies confusion, memory loss or sensory loss. Endo: Denies change in weight, skin or hair change.  Heme/Lymph: No excessive bleeding, bruising or enlarged lymph nodes.  Physical Exam  BP 122/76   Pulse 80   Temp 97.9 F (36.6 C)   Resp 16   Ht '5\' 4"'$  (1.626 m)   Wt 146 lb 6.4 oz (66.4 kg)   SpO2 94%   BMI 25.13 kg/m   Appears  well nourished, well groomed  and in no distress.  Eyes: PERRLA, EOMs, conjunctiva no swelling or erythema. Sinuses: No frontal/maxillary tenderness ENT/Mouth: EAC's clear, TM's nl w/o erythema, bulging. Nares clear w/o erythema, swelling, exudates. Oropharynx clear without erythema or exudates. Oral hygiene is good. Tongue normal, non obstructing. Hearing intact.  Neck: Supple. Thyroid not palpable. Car 2+/2+ without bruits, nodes or JVD. Chest: Respirations nl with BS clear & equal w/o rales, rhonchi, wheezing or stridor.  Cor: Heart sounds normal w/ regular rate and rhythm without sig. murmurs, gallops, clicks or rubs. Peripheral pulses normal and equal  without edema.  Abdomen: Soft & bowel sounds normal. Non-tender w/o guarding, rebound, hernias, masses or organomegaly.  Lymphatics: Unremarkable.  Musculoskeletal: Full ROM all peripheral extremities, joint  stability, 5/5 strength and normal gait.  Skin: Warm, dry without exposed rashes, lesions or ecchymosis apparent.  Neuro: Cranial nerves intact, reflexes equal bilaterally. Sensory-motor testing grossly intact. Tendon reflexes grossly intact.  Pysch: Alert & oriented x 3.  Insight and judgement nl & appropriate. No ideations.  Assessment and Plan:   1. Essential hypertension  - Continue medication, monitor blood pressure at home.  - Continue DASH diet.  Reminder to go to the ER if any CP,  SOB, nausea, dizziness, severe HA, changes vision/speech.  - CBC with Differential/Platelet - COMPLETE METABOLIC PANEL WITH GFR - Magnesium - TSH   2. Hyperlipidemia associated with type 2 diabetes mellitus ([place])  - Continue diet/meds, exercise,& lifestyle modifications.  - Continue monitor periodic cholesterol/liver & renal  functions   - Lipid panel - TSH   3. 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  - Monitor appropriate labs.  - Hemoglobin A1c - Insulin, random   4. Vitamin D deficiency  - Continue supplementation.  - VITAMIN D 25 Hydroxy    5. Gastroesophageal reflux disease  - CBC with Differential/Platelet   6. Medication management  - CBC with Differential/Platelet - COMPLETE METABOLIC PANEL WITH GFR - Magnesium - Lipid panel - TSH - Hemoglobin A1c - Insulin, random - VITAMIN D 25 Hydroxy          Discussed  regular exercise, BP monitoring, weight control to achieve/maintain BMI less than 25 and discussed med and SE's. Recommended labs to assess and monitor clinical status with further disposition pending results of labs.  I discussed the assessment and treatment plan with the patient. The patient was provided an opportunity to ask questions and all were answered. The patient agreed with the plan and demonstrated an  understanding of the instructions.  I provided over 30 minutes of exam, counseling, chart review and  complex critical decision making.   Kirtland Bouchard, MD

## 2021-12-01 ENCOUNTER — Ambulatory Visit (INDEPENDENT_AMBULATORY_CARE_PROVIDER_SITE_OTHER): Payer: Medicare Other | Admitting: Internal Medicine

## 2021-12-01 ENCOUNTER — Encounter: Payer: Self-pay | Admitting: Internal Medicine

## 2021-12-01 VITALS — BP 122/76 | HR 80 | Temp 97.9°F | Resp 16 | Ht 64.0 in | Wt 146.4 lb

## 2021-12-01 DIAGNOSIS — E785 Hyperlipidemia, unspecified: Secondary | ICD-10-CM

## 2021-12-01 DIAGNOSIS — I10 Essential (primary) hypertension: Secondary | ICD-10-CM

## 2021-12-01 DIAGNOSIS — E0822 Diabetes mellitus due to underlying condition with diabetic chronic kidney disease: Secondary | ICD-10-CM | POA: Diagnosis not present

## 2021-12-01 DIAGNOSIS — E559 Vitamin D deficiency, unspecified: Secondary | ICD-10-CM | POA: Diagnosis not present

## 2021-12-01 DIAGNOSIS — E1169 Type 2 diabetes mellitus with other specified complication: Secondary | ICD-10-CM

## 2021-12-01 DIAGNOSIS — K219 Gastro-esophageal reflux disease without esophagitis: Secondary | ICD-10-CM

## 2021-12-01 DIAGNOSIS — Z79899 Other long term (current) drug therapy: Secondary | ICD-10-CM

## 2021-12-01 DIAGNOSIS — N182 Chronic kidney disease, stage 2 (mild): Secondary | ICD-10-CM

## 2021-12-02 LAB — COMPLETE METABOLIC PANEL WITH GFR
AG Ratio: 1.3 (calc) (ref 1.0–2.5)
ALT: 31 U/L — ABNORMAL HIGH (ref 6–29)
AST: 32 U/L (ref 10–35)
Albumin: 4.2 g/dL (ref 3.6–5.1)
Alkaline phosphatase (APISO): 88 U/L (ref 37–153)
BUN: 10 mg/dL (ref 7–25)
CO2: 30 mmol/L (ref 20–32)
Calcium: 9.9 mg/dL (ref 8.6–10.4)
Chloride: 102 mmol/L (ref 98–110)
Creat: 0.8 mg/dL (ref 0.60–1.00)
Globulin: 3.2 g/dL (calc) (ref 1.9–3.7)
Glucose, Bld: 85 mg/dL (ref 65–99)
Potassium: 4 mmol/L (ref 3.5–5.3)
Sodium: 138 mmol/L (ref 135–146)
Total Bilirubin: 0.7 mg/dL (ref 0.2–1.2)
Total Protein: 7.4 g/dL (ref 6.1–8.1)
eGFR: 76 mL/min/{1.73_m2} (ref >=60)

## 2021-12-02 LAB — CBC WITH DIFFERENTIAL/PLATELET
Absolute Monocytes: 445 cells/uL (ref 200–950)
Basophils Absolute: 37 cells/uL (ref 0–200)
Basophils Relative: 0.7 %
Eosinophils Absolute: 148 cells/uL (ref 15–500)
Eosinophils Relative: 2.8 %
HCT: 39.7 % (ref 35.0–45.0)
Hemoglobin: 13.3 g/dL (ref 11.7–15.5)
Lymphs Abs: 1887 cells/uL (ref 850–3900)
MCH: 28.8 pg (ref 27.0–33.0)
MCHC: 33.5 g/dL (ref 32.0–36.0)
MCV: 85.9 fL (ref 80.0–100.0)
MPV: 10.6 fL (ref 7.5–12.5)
Monocytes Relative: 8.4 %
Neutro Abs: 2783 cells/uL (ref 1500–7800)
Neutrophils Relative %: 52.5 %
Platelets: 261 10*3/uL (ref 140–400)
RBC: 4.62 10*6/uL (ref 3.80–5.10)
RDW: 14 % (ref 11.0–15.0)
Total Lymphocyte: 35.6 %
WBC: 5.3 10*3/uL (ref 3.8–10.8)

## 2021-12-02 LAB — HEMOGLOBIN A1C
Hgb A1c MFr Bld: 5.7 % of total Hgb — ABNORMAL HIGH (ref <5.7)
Mean Plasma Glucose: 117 mg/dL
eAG (mmol/L): 6.5 mmol/L

## 2021-12-02 LAB — LIPID PANEL
Cholesterol: 143 mg/dL (ref <200)
HDL: 44 mg/dL — ABNORMAL LOW (ref >=50)
LDL Cholesterol (Calc): 75 mg/dL (calc)
Non-HDL Cholesterol (Calc): 99 mg/dL (calc) (ref <130)
Total CHOL/HDL Ratio: 3.3 (calc) (ref <5.0)
Triglycerides: 158 mg/dL — ABNORMAL HIGH (ref <150)

## 2021-12-02 LAB — TSH: TSH: 1.92 mIU/L (ref 0.40–4.50)

## 2021-12-02 LAB — MAGNESIUM: Magnesium: 2 mg/dL (ref 1.5–2.5)

## 2021-12-02 LAB — VITAMIN D 25 HYDROXY (VIT D DEFICIENCY, FRACTURES): Vit D, 25-Hydroxy: 98 ng/mL (ref 30–100)

## 2021-12-02 LAB — INSULIN, RANDOM: Insulin: 14.9 u[IU]/mL

## 2021-12-03 ENCOUNTER — Encounter: Payer: Self-pay | Admitting: Internal Medicine

## 2021-12-06 ENCOUNTER — Other Ambulatory Visit: Payer: Self-pay | Admitting: Nurse Practitioner

## 2021-12-19 ENCOUNTER — Other Ambulatory Visit: Payer: Self-pay | Admitting: Nurse Practitioner

## 2022-01-05 ENCOUNTER — Ambulatory Visit
Admission: RE | Admit: 2022-01-05 | Discharge: 2022-01-05 | Disposition: A | Discharge status: Home / Self Care | Payer: Medicare Other | Source: Ambulatory Visit | Attending: Internal Medicine | Admitting: Internal Medicine

## 2022-01-05 DIAGNOSIS — Z1231 Encounter for screening mammogram for malignant neoplasm of breast: Secondary | ICD-10-CM

## 2022-01-09 ENCOUNTER — Other Ambulatory Visit: Payer: Self-pay | Admitting: Nurse Practitioner

## 2022-02-03 ENCOUNTER — Other Ambulatory Visit: Payer: Self-pay | Admitting: Nurse Practitioner

## 2022-02-17 ENCOUNTER — Ambulatory Visit
Admission: RE | Admit: 2022-02-17 | Discharge: 2022-02-17 | Disposition: A | Discharge status: Home / Self Care | Payer: Medicare Other | Source: Ambulatory Visit | Attending: Nurse Practitioner | Admitting: Nurse Practitioner

## 2022-02-17 DIAGNOSIS — Z13820 Encounter for screening for osteoporosis: Secondary | ICD-10-CM

## 2022-03-03 ENCOUNTER — Other Ambulatory Visit: Payer: Self-pay | Admitting: Nurse Practitioner

## 2022-03-13 ENCOUNTER — Ambulatory Visit (INDEPENDENT_AMBULATORY_CARE_PROVIDER_SITE_OTHER): Payer: Medicare Other | Admitting: Nurse Practitioner

## 2022-03-13 ENCOUNTER — Other Ambulatory Visit: Payer: Self-pay | Admitting: Internal Medicine

## 2022-03-13 ENCOUNTER — Encounter: Payer: Self-pay | Admitting: Nurse Practitioner

## 2022-03-13 VITALS — BP 104/70 | HR 71 | Temp 97.3°F | Ht 64.0 in | Wt 143.0 lb

## 2022-03-13 DIAGNOSIS — G629 Polyneuropathy, unspecified: Secondary | ICD-10-CM

## 2022-03-13 DIAGNOSIS — N3281 Overactive bladder: Secondary | ICD-10-CM

## 2022-03-13 DIAGNOSIS — E559 Vitamin D deficiency, unspecified: Secondary | ICD-10-CM | POA: Diagnosis not present

## 2022-03-13 DIAGNOSIS — N951 Menopausal and female climacteric states: Secondary | ICD-10-CM

## 2022-03-13 DIAGNOSIS — Z6824 Body mass index (BMI) 24.0-24.9, adult: Secondary | ICD-10-CM

## 2022-03-13 DIAGNOSIS — E785 Hyperlipidemia, unspecified: Secondary | ICD-10-CM

## 2022-03-13 DIAGNOSIS — N182 Chronic kidney disease, stage 2 (mild): Secondary | ICD-10-CM

## 2022-03-13 DIAGNOSIS — N200 Calculus of kidney: Secondary | ICD-10-CM

## 2022-03-13 DIAGNOSIS — R6889 Other general symptoms and signs: Secondary | ICD-10-CM

## 2022-03-13 DIAGNOSIS — Z0001 Encounter for general adult medical examination with abnormal findings: Secondary | ICD-10-CM | POA: Diagnosis not present

## 2022-03-13 DIAGNOSIS — E1169 Type 2 diabetes mellitus with other specified complication: Secondary | ICD-10-CM

## 2022-03-13 DIAGNOSIS — Z0000 Encounter for general adult medical examination without abnormal findings: Secondary | ICD-10-CM

## 2022-03-13 DIAGNOSIS — K219 Gastro-esophageal reflux disease without esophagitis: Secondary | ICD-10-CM

## 2022-03-13 DIAGNOSIS — E1140 Type 2 diabetes mellitus with diabetic neuropathy, unspecified: Secondary | ICD-10-CM

## 2022-03-13 DIAGNOSIS — I10 Essential (primary) hypertension: Secondary | ICD-10-CM

## 2022-03-13 DIAGNOSIS — Z86010 Personal history of colonic polyps: Secondary | ICD-10-CM

## 2022-03-13 DIAGNOSIS — B001 Herpesviral vesicular dermatitis: Secondary | ICD-10-CM

## 2022-03-13 DIAGNOSIS — Z79899 Other long term (current) drug therapy: Secondary | ICD-10-CM

## 2022-03-13 DIAGNOSIS — Z860101 Personal history of adenomatous and serrated colon polyps: Secondary | ICD-10-CM

## 2022-03-13 DIAGNOSIS — E0822 Diabetes mellitus due to underlying condition with diabetic chronic kidney disease: Secondary | ICD-10-CM

## 2022-03-13 DIAGNOSIS — Z9109 Other allergy status, other than to drugs and biological substances: Secondary | ICD-10-CM

## 2022-03-13 NOTE — Progress Notes (Signed)
MEDICARE VISIT AND FOLLOW UP  Assessment:    Annual Medicare Wellness Visit Due annually  Health maintenance reviewed  Essential hypertension Continue Enalaprill, HCTZ Discussed DASH (Dietary Approaches to Stop Hypertension) DASH diet is lower in sodium than a typical American diet. Cut back on foods that are high in saturated fat, cholesterol, and trans fats. Eat more whole-grain foods, fish, poultry, and nuts Remain active and exercise as tolerated daily.  Monitor BP at home-Call if greater than 130/80.  Check CMP/CBC   Gastroesophageal reflux disease, esophagitis presence not specified Continue Omeprazole PRN No suspected reflux complications ([place]/stricture). Lifestyle modification:  wt loss, avoid meals 2-3h before bedtime. Consider eliminating food triggers:  chocolate, caffeine, EtOH, acid/spicy food.    OAB (overactive bladder) Continue Vesicare. Follows with Urology Continue to monitor   Nephrolithiasis Stay well hydrated Followed by Dr. Matilde Sprang PRN Continue to monitor  Vitamin D deficiency At goal at recent check; continue to recommend supplementation for goal of 70-100 Monitor Vitamin D levels  T2DM with CKD  Continue Metformin, Mounjaro Education: Reviewed 'ABCs' of diabetes management  Discussed goals to be met and/or maintained include A1C (<7) Blood pressure (<130/80) Cholesterol (LDL <70) Continue Eye Exam yearly  Continue Dental Exam Q6 mo Discussed dietary recommendations Discussed Physical Activity recommendations Foot exam UTD Check A1C Continue ACE, bASA, statin Discussed how what you eat and drink can aide in kidney protection. Stay well hydrated. Avoid high salt foods. Avoid NSAIDS. Keep BP and BG well controlled.   Take medications as prescribed. Remain active and exercise as tolerated daily. Maintain weight.  Continue to monitor. Check CMP/GFR/Microablumin   Mixed hyperlipidemia associated with T2DM (HCC) Continue  Atorvastatin Discussed lifestyle modifications. Recommended diet heavy in fruits and veggies, omega 3's. Decrease consumption of animal meats, cheeses, and dairy products. Remain active and exercise as tolerated. Continue to monitor. Check lipids/TSH   T2DM with sensory neuropathy (HCC) Monitor feet daily, declines meds  BMI 24 - normal weight Continue to recommend diet heavy in fruits and veggies and low in animal meats, cheeses, and dairy products, appropriate calorie intake Discuss exercise recommendations routinely Continue to monitor weight at each visit  Medication management All medications discussed and reviewed in full. All questions and concerns regarding medications addressed.    Recurrent cold sores Acyclovir PRN  History of adenomatous polyp of colon No further colonoscopies due to age per Dr. Loletha Carrow Denies concerning sx  High fiber plant based diet encouraged, continue soluble fiber supplement  Post menopausal hot flashes Has been on estrogen for many years; discussed risks and recommendation to taper to stop.  Kaitlyn Townsend is receptive to try this in the cooler months.    Environmental allergies H2 PRN Saline irrigations/flonase PRN,  Allergy hygiene explained. Follow up if not improving or with progressive sx  Orders Placed This Encounter  Procedures   CBC with Differential/Platelet   COMPLETE METABOLIC PANEL WITH GFR   Lipid panel   Hemoglobin A1c   VITAMIN D 25 Hydroxy (Vit-D Deficiency, Fractures)    Notify office for further evaluation and treatment, questions or concerns if any reported s/s fail to improve.   The patient was advised to call back or seek an in-person evaluation if any symptoms worsen or if the condition fails to improve as anticipated.   Further disposition pending results of labs. Discussed med's effects and SE's.    I discussed the assessment and treatment plan with the patient. The patient was provided an opportunity to ask questions  and all were  answered. The patient agreed with the plan and demonstrated an understanding of the instructions.  Discussed med's effects and SE's. Screening labs and tests as requested with regular follow-up as recommended.  I provided 40 minutes of face-to-face time during this encounter including counseling, chart review, and critical decision making was preformed.  Future Appointments  Date Time Provider [place]  07/11/2022 11:00 AM Unk Pinto, MD [facility] None     Plan:   During the course of the visit the patient was educated and counseled about appropriate screening and preventive services including:   Pneumococcal vaccine  Prevnar 13 Influenza vaccine Td vaccine Screening electrocardiogram Bone densitometry screening Colorectal cancer screening Diabetes screening Glaucoma screening Nutrition counseling  Advanced directives: requested   Subjective:  Kaitlyn Townsend is a 77 y.o. female who presents for AWV and follow up. 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   Kaitlyn Townsend is married, 1 son, no grandchildren. 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   Kaitlyn Townsend continues to take care of her disabled husband.    Kaitlyn Townsend is followed by Dr. Matilde Sprang as needed at [facility] for recurrent nephrolithiasis.  Kaitlyn Townsend follows PRN.  Denies recent flare.    Kaitlyn Townsend is on estrace 1 mg daily following total hysterectomy and doing well  with this. Kaitlyn Townsend is on bASA and Kaitlyn Townsend is active. Kaitlyn Townsend had significant hot flashes with attempt to taper continues with 1 mg daily, Kaitlyn Townsend is receptive to trying to taper later this year in the fall once cooler after discussion.   Kaitlyn Townsend takes Vesicare for OAB is working well for her - denies recent flare.  Kaitlyn Townsend takes acyclovir as needed for cold sores. Reports well managed.  Kaitlyn Townsend has GERD on omeprazole 20 mg daily. Currently well controlled. Takes PRN.  BMI is Body mass index is 24.55 kg/m., Kaitlyn Townsend has been working on diet and exercise. Wt Readings from Last 3 Encounters:  03/13/22 143 lb (64.9 kg)  12/01/21 146 lb 6.4 oz (66.4 kg)  08/30/21 151 lb (68.5 kg)    Her blood pressure has been controlled at home, today their BP is BP: 104/70 Kaitlyn Townsend does workout. Kaitlyn Townsend denies chest pain, shortness of breath, dizziness.   Kaitlyn Townsend is on cholesterol medication (atorvastatin 80 mg daily) and denies myalgias. Her LDL cholesterol is not at goal of LDL <70 at last check. The cholesterol last visit was:   Lab Results  Component Value Date   CHOL 143 12/01/2021   HDL 44 (L) 12/01/2021   LDLCALC 75 12/01/2021   TRIG 158 (H) 12/01/2021   CHOLHDL 3.3 12/01/2021    Kaitlyn Townsend has been working on diet and exercise for T2DM on metformin 500 mg daily and denies foot ulcerations, increased appetite, nausea, paresthesia of the feet, polydipsia, polyuria, visual disturbances, vomiting and weight loss. Kaitlyn Townsend does check fasting sugars, range (80s-110s). Last A1C in the office was:  Lab Results  Component Value Date   HGBA1C 5.7 (H) 12/01/2021   Last GFR: Lab Results  Component Value Date   [facility] 66 04/22/2020   Patient is on Vitamin D supplement  and approaching goal at recent check, alternates 5000 IU and 10000 IU:    Lab Results  Component Value Date   VD25OH 98 12/01/2021       Medication Review:  Current Outpatient Medications (Endocrine & Metabolic):    MOUNJARO 5 MG/0.5ML Pen, INJECT 1 PEN SUBCUTANEOUSLY ONCE WEEKLY FOR  DIABETES   estradiol (ESTRACE) 0.5 MG tablet, Take  1 tablet  Daily (Patient not taking: Reported on 03/13/2022)   metFORMIN (GLUCOPHAGE XR) 500 MG 24 hr tablet, Take 1 tablet daily with Meals for Diabetes (Patient not taking: Reported on 03/13/2022)  Current Outpatient Medications (Cardiovascular):    atorvastatin (LIPITOR) 80 MG tablet, TAKE 1 TABLET BY MOUTH ONCE DAILY FOR CHOLESTEROL   enalapril (VASOTEC) 20 MG tablet, Take  1 tablet  Daily for BP                                                                        /                                                Take                              by                           mouth                           once daily   hydrochlorothiazide (HYDRODIURIL) 25 MG tablet, TAKE 1 TABLET BY MOUTH ONCE DAILY FOR BLOOD PRESSURE, FLUID RETENTION AND ANKLE SWELLING  Current Outpatient Medications (Respiratory):    cetirizine (ZYRTEC) 10 MG tablet, TAKE 1 TABLET BY MOUTH ONCE DAILY AS NEEDED FOR ALLERGIES   pseudoephedrine (SUDAFED) 120 MG 12 hr tablet, Take  1 tablet  2 x /day (every 12 hours)  for Sinus & Chest Congestion  Current Outpatient Medications (Analgesics):    aspirin 81 MG chewable tablet, Chew by mouth every other day.   Current Outpatient Medications (Other):    blood glucose meter kit and supplies, Test sugars as directed by provider. Dispense based insurance preference. E11.22   Blood Glucose Monitoring Suppl (ACCU-CHEK AVIVA PLUS) w/Device KIT, Check blood sugar 1 time daily-DX-R73.09   Cholecalciferol (VITAMIN D PO), Take 5,000 Int'l Units by mouth daily. Take 10,000 units M,W,F and 5000 units all other days   glucose blood test strip, Use to check blood sugar once daily.   KRILL OIL PO, Take by mouth daily.   Lancets (ACCU-CHEK SOFT TOUCH) lancets, Check blood sugar 1 time daily-DX-R73.09   Multiple Vitamins-Minerals (MULTIVITAMIN WITH MINERALS) tablet, Take 1 tablet by mouth daily.   omeprazole (PRILOSEC) 20 MG capsule, Take 1  capsule (20 mg total) by mouth daily. (Patient taking differently: Take 20 mg by mouth as needed.)   solifenacin (VESICARE) 5 MG tablet, TAKE 1 TABLET BY MOUTH ONCE DAILY FOR BLADDER CONTROL   hydrocortisone (ANUSOL-HC) 25 MG suppository, Place  1 suppository (25 mg total) rectally 2 (two) times daily. For 7 days (Patient not taking: Reported on 03/13/2022)   MAGNESIUM PO, Take 500 mg by mouth daily. 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, without long-term current use of insulin ([place]) 11/22/2017   Former smoker (25 pack year, quit 1996) 08/21/2017   FHx: heart disease 08/21/2017   Recurrent cold sores 05/14/2017   Nephrolithiasis 02/16/2016   OAB (overactive bladder) 02/16/2016   Medication management 02/13/2013   Hyperlipidemia associated with type 2 diabetes mellitus ([place])    Essential hypertension    Vitamin D deficiency    GERD (gastroesophageal reflux disease)     Screening Tests Immunization History  Administered Date(s) Administered   Influenza Split 10/31/2012   Influenza, High Dose Seasonal PF 09/13/2016, 11/08/2017   Influenza-Unspecified 09/26/2014, 10/06/2015, 09/04/2018   PFIZER(Purple Top)SARS-COV-2 Vaccination 03/09/2019, 04/02/2019, 10/29/2019   Pneumococcal Conjugate-13 03/09/2014   Pneumococcal Polysaccharide-23 08/06/2015   Td 08/09/2012   Zoster, Live 08/14/2012   Tetanus: 2014 Influenza: 10/2021 Pneumonia 2/2 Shingles: zoster in 2014, declines shingrix  Covid 19: 3/3, 2021, pfizer  Preventative care: Last colonoscopy: 05/2020, Dr. Loletha Carrow, 1 adenomatous polyp, no follow up  recommended due to age Mammogram:112/2023  Last pap smear/pelvic exam: remote; s/p total hysterectomy DEXA: 2019, normal; 02/17/2022 T-score -0.3 normal  Former smoker - quit 1996, 26 pack year hx, declines sx or CXR  Names of Other Physician/Practitioners you currently use: 1. [facility] here for primary care 2. [facility], Dr, Mariea Stable, eye doctor, last visit 12/21/2021 - report requested 3. Dr. Deatra Ina, dentist, last visit 2023, q108m Patient Care Team: MUnk Pinto MD as PCP - General (Internal Medicine) KInda Castle MD (Inactive) as Consulting Physician (Gastroenterology) MBjorn Loser MD as Consulting Physician (Urology)  SURGICAL HISTORY Kaitlyn Townsend  has a past surgical history that includes Knee surgery (Left, 09/2009); Abdominal hysterectomy (Bilateral, 1977); and Colonoscopy (05/27/2020). FAMILY HISTORY Her family history includes Colon polyps in her brother and sister; Heart disease in her brother and mother; Hypertension in her brother; Kidney disease in her brother; Stroke in her maternal grandmother; Ulcers in her father. SOCIAL HISTORY Kaitlyn Townsend  reports that Kaitlyn Townsend quit smoking about 28 years ago. Her smoking use included cigarettes. Kaitlyn Townsend started smoking about 52 years ago. Kaitlyn Townsend has a 25.50 pack-year smoking history. Kaitlyn Townsend has never used smokeless tobacco. Kaitlyn Townsend reports current alcohol use. Kaitlyn Townsend reports that Kaitlyn Townsend does not use drugs.  MEDICARE WELLNESS OBJECTIVES: Physical activity:   Cardiac risk factors:   Depression/mood screen:      03/13/2022   10:10 AM  Depression screen PHQ 2/9  Decreased Interest 0  Down, Depressed, Hopeless 0  PHQ - 2 Score 0    ADLs:     03/13/2022   10:08 AM 08/30/2021   10:02 AM  In your present state of health, do you have any difficulty performing the following activities:  Hearing? 0 0  Vision? 0 0  Difficulty concentrating or making decisions? 0 0  Walking or climbing stairs? 0 0  Dressing or  bathing? 0 0  Doing errands, shopping? 0 0  Preparing Food and eating ? N N  Using the Toilet? N N  In the past six months, have you accidently leaked urine? N N  Do you have problems with loss of bowel control? N N  Managing your Medications? N N  Managing your Finances? N N  Housekeeping or managing your Housekeeping? N N     Cognitive Testing  Alert? Yes  Normal Appearance?Yes  Oriented to person? Yes  Place? Yes   Time? Yes  Recall of three objects?  Yes  Can perform simple calculations? Yes  Displays appropriate judgment?Yes  Can read the correct time from a watch face?Yes  EOL planning: Does Patient Have a Medical Advance Directive?: Yes Type of Advance Directive: Living will  Review of Systems  Constitutional:  Negative for fever, malaise/fatigue and weight loss.  HENT:  Positive for congestion. Negative for hearing loss, sinus pain, sore throat and tinnitus.   Eyes:  Negative for blurred vision and double vision.  Respiratory:  Negative for cough, sputum production, shortness of breath and wheezing.   Cardiovascular:  Negative for chest pain, palpitations, orthopnea, claudication, leg swelling and PND.  Gastrointestinal:  Negative for abdominal pain, blood in stool, constipation, diarrhea, heartburn, melena, nausea and vomiting.  Genitourinary: Negative.   Musculoskeletal:  Negative for falls, joint pain and myalgias.  Skin:  Negative for itching and rash.  Neurological:  Negative for dizziness, tingling, sensory change, weakness and headaches.  Endo/Heme/Allergies:  Positive for environmental allergies. Negative for polydipsia.  Psychiatric/Behavioral: Negative.  Negative for depression, memory loss, substance abuse and suicidal ideas. The patient is not nervous/anxious and does not have insomnia.   All other systems reviewed and are negative.    Objective:     Today's Vitals   03/13/22 0932  BP: 104/70  Pulse: 71  Temp: (!) 97.3 F (36.3 C)  SpO2: 99%   Weight: 143 lb (64.9 kg)  Height: '5\' 4"'$  (1.626 m)   Body mass index is 24.55 kg/m.  General appearance: alert, no distress, WD/WN, female HEENT: normocephalic, sclerae anicteric, TMs pearly, nares patent, no discharge or erythema, pharynx normal Oral cavity: MMM, no lesions Neck: supple, no lymphadenopathy, no thyromegaly, no masses Heart: RRR, normal S1, S2, no murmurs Lungs: CTA bilaterally, no wheezes, rhonchi, or rales Abdomen: +bs, soft, non tender, non distended, no masses, no hepatomegaly, no splenomegaly Musculoskeletal: nontender, no swelling, no obvious deformity Extremities: no edema, no cyanosis, no clubbing Pulses: 2+ symmetric, upper and lower extremities, normal cap refill Neurological: alert, oriented x 3, CN2-12 intact, strength normal upper extremities and lower extremities, sensation normal throughout, DTRs 2+ throughout, no cerebellar signs, gait normal Psychiatric: normal affect, behavior normal, pleasant    Medicare Attestation I have personally reviewed: The patient's medical and social history Their use of alcohol, tobacco or illicit drugs Their current medications and supplements The patient's functional ability including ADLs,fall risks, home safety risks, cognitive, and hearing and visual impairment Diet and physical activities Evidence for depression or mood disorders  The patient's weight, height, BMI, and visual acuity have been recorded in the chart.  I have made referrals, counseling, and provided education to the patient based on review of the above and I have provided the patient with a written personalized care plan for preventive services.     Darrol Jump, NP   03/13/2022

## 2022-03-13 NOTE — Patient Instructions (Signed)

## 2022-03-14 LAB — COMPLETE METABOLIC PANEL WITH GFR
AG Ratio: 1.4 (calc) (ref 1.0–2.5)
ALT: 39 U/L — ABNORMAL HIGH (ref 6–29)
AST: 37 U/L — ABNORMAL HIGH (ref 10–35)
Albumin: 4.2 g/dL (ref 3.6–5.1)
Alkaline phosphatase (APISO): 83 U/L (ref 37–153)
BUN: 8 mg/dL (ref 7–25)
CO2: 31 mmol/L (ref 20–32)
Calcium: 9.9 mg/dL (ref 8.6–10.4)
Chloride: 104 mmol/L (ref 98–110)
Creat: 0.8 mg/dL (ref 0.60–1.00)
Globulin: 2.9 g/dL (calc) (ref 1.9–3.7)
Glucose, Bld: 87 mg/dL (ref 65–99)
Potassium: 3.5 mmol/L (ref 3.5–5.3)
Sodium: 143 mmol/L (ref 135–146)
Total Bilirubin: 0.6 mg/dL (ref 0.2–1.2)
Total Protein: 7.1 g/dL (ref 6.1–8.1)
eGFR: 76 mL/min/{1.73_m2} (ref >=60)

## 2022-03-14 LAB — CBC WITH DIFFERENTIAL/PLATELET
Absolute Monocytes: 446 cells/uL (ref 200–950)
Basophils Absolute: 41 cells/uL (ref 0–200)
Basophils Relative: 0.9 %
Eosinophils Absolute: 120 cells/uL (ref 15–500)
Eosinophils Relative: 2.6 %
HCT: 40.3 % (ref 35.0–45.0)
Hemoglobin: 13 g/dL (ref 11.7–15.5)
Lymphs Abs: 1697 cells/uL (ref 850–3900)
MCH: 27 pg (ref 27.0–33.0)
MCHC: 32.3 g/dL (ref 32.0–36.0)
MCV: 83.6 fL (ref 80.0–100.0)
MPV: 10.7 fL (ref 7.5–12.5)
Monocytes Relative: 9.7 %
Neutro Abs: 2295 cells/uL (ref 1500–7800)
Neutrophils Relative %: 49.9 %
Platelets: 241 10*3/uL (ref 140–400)
RBC: 4.82 10*6/uL (ref 3.80–5.10)
RDW: 13.8 % (ref 11.0–15.0)
Total Lymphocyte: 36.9 %
WBC: 4.6 10*3/uL (ref 3.8–10.8)

## 2022-03-14 LAB — LIPID PANEL
Cholesterol: 126 mg/dL (ref <200)
HDL: 42 mg/dL — ABNORMAL LOW (ref >=50)
LDL Cholesterol (Calc): 65 mg/dL (calc)
Non-HDL Cholesterol (Calc): 84 mg/dL (calc) (ref <130)
Total CHOL/HDL Ratio: 3 (calc) (ref <5.0)
Triglycerides: 107 mg/dL (ref <150)

## 2022-03-14 LAB — HEMOGLOBIN A1C
Hgb A1c MFr Bld: 5.8 % of total Hgb — ABNORMAL HIGH (ref <5.7)
Mean Plasma Glucose: 120 mg/dL
eAG (mmol/L): 6.6 mmol/L

## 2022-03-14 LAB — VITAMIN D 25 HYDROXY (VIT D DEFICIENCY, FRACTURES): Vit D, 25-Hydroxy: 105 ng/mL — ABNORMAL HIGH (ref 30–100)

## 2022-03-23 ENCOUNTER — Other Ambulatory Visit: Payer: Self-pay | Admitting: Nurse Practitioner

## 2022-03-23 ENCOUNTER — Other Ambulatory Visit: Payer: Self-pay

## 2022-03-23 DIAGNOSIS — E1169 Type 2 diabetes mellitus with other specified complication: Secondary | ICD-10-CM

## 2022-03-23 MED ORDER — GLUCOSE BLOOD VI STRP: ORAL_STRIP | 12 refills | Status: AC

## 2022-03-27 ENCOUNTER — Other Ambulatory Visit: Payer: Self-pay | Admitting: Nurse Practitioner

## 2022-05-03 ENCOUNTER — Encounter: Payer: Medicare Other | Admitting: Internal Medicine

## 2022-06-08 ENCOUNTER — Other Ambulatory Visit: Payer: Self-pay | Admitting: Nurse Practitioner

## 2022-06-08 DIAGNOSIS — E1169 Type 2 diabetes mellitus with other specified complication: Secondary | ICD-10-CM

## 2022-06-19 ENCOUNTER — Other Ambulatory Visit: Payer: Self-pay | Admitting: Nurse Practitioner

## 2022-07-11 ENCOUNTER — Ambulatory Visit (INDEPENDENT_AMBULATORY_CARE_PROVIDER_SITE_OTHER): Payer: Medicare Other | Admitting: Internal Medicine

## 2022-07-11 ENCOUNTER — Encounter: Payer: Self-pay | Admitting: Internal Medicine

## 2022-07-11 VITALS — BP 120/70 | HR 78 | Temp 97.9°F | Resp 17 | Ht 64.0 in | Wt 142.0 lb

## 2022-07-11 DIAGNOSIS — I10 Essential (primary) hypertension: Secondary | ICD-10-CM | POA: Diagnosis not present

## 2022-07-11 DIAGNOSIS — Z136 Encounter for screening for cardiovascular disorders: Secondary | ICD-10-CM | POA: Diagnosis not present

## 2022-07-11 DIAGNOSIS — E1169 Type 2 diabetes mellitus with other specified complication: Secondary | ICD-10-CM

## 2022-07-11 DIAGNOSIS — Z8249 Family history of ischemic heart disease and other diseases of the circulatory system: Secondary | ICD-10-CM

## 2022-07-11 DIAGNOSIS — K2100 Gastro-esophageal reflux disease with esophagitis, without bleeding: Secondary | ICD-10-CM

## 2022-07-11 DIAGNOSIS — E559 Vitamin D deficiency, unspecified: Secondary | ICD-10-CM

## 2022-07-11 DIAGNOSIS — Z1212 Encounter for screening for malignant neoplasm of rectum: Secondary | ICD-10-CM

## 2022-07-11 DIAGNOSIS — E1140 Type 2 diabetes mellitus with diabetic neuropathy, unspecified: Secondary | ICD-10-CM

## 2022-07-11 DIAGNOSIS — Z0001 Encounter for general adult medical examination with abnormal findings: Secondary | ICD-10-CM

## 2022-07-11 DIAGNOSIS — E0822 Diabetes mellitus due to underlying condition with diabetic chronic kidney disease: Secondary | ICD-10-CM

## 2022-07-11 DIAGNOSIS — Z0000 Encounter for general adult medical examination without abnormal findings: Secondary | ICD-10-CM | POA: Diagnosis not present

## 2022-07-11 DIAGNOSIS — E1122 Type 2 diabetes mellitus with diabetic chronic kidney disease: Secondary | ICD-10-CM

## 2022-07-11 DIAGNOSIS — Z87891 Personal history of nicotine dependence: Secondary | ICD-10-CM

## 2022-07-11 DIAGNOSIS — Z79899 Other long term (current) drug therapy: Secondary | ICD-10-CM

## 2022-07-11 NOTE — Patient Instructions (Signed)

## 2022-07-11 NOTE — Progress Notes (Unsigned)
Annual Screening/Preventative Visit & Comprehensive Evaluation &  Examination  Future Appointments  Date Time Provider Department  07/11/2022                           cpe 11:00 AM Lucky Cowboy, MD [facility]  03/26/2023                           wellness  9:30 AM Adela Glimpse, NP [facility]  08/01/2023                            cpe 10:00 AM Lucky Cowboy, MD [facility]        This very nice 77 y.o. MBF with  HTN, HLD, T2_NIDDM and Vitamin D Deficiency  presents for a Screening /Preventative Visit & comprehensive evaluation and management of multiple medical co-morbidities.           HTN predates circa 1995. Patient's BP has been controlled at home and patient denies any cardiac symptoms as chest pain, palpitations, shortness of breath, dizziness or ankle swelling. Today's BP is at goal - 120/72 .        Patient's hyperlipidemia is controlled with diet and Atorvastatin. Patient denies myalgias or other medication SE's. 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   Patient denies reactive hypoglycemic symptoms, visual blurring, diabetic polys or paresthesias. Last A1c was near goal :  Lab Results  Component Value Date   HGBA1C 5.8 (H) 03/13/2022         Finally, patient has history of Vitamin D Deficiency  ("43"/2008) and last Vitamin D was at goal :  Lab Results  Component Value Date   VD25OH 105 (H) 03/13/2022       Current Outpatient Medications  Medication Instructions   aspirin 81 MG  Every other day   atorvastatin  80 MG tablet Take  1 tablet  Daily   cetirizine 10 MG tablet TAKE 1 TABLET DAILY AS NEEDED    VITAMIN D 5,000 u   Take 10,000 units M,W,F /5000 units other days    enalapril (VASOTEC) 20 MG tablet Take  1 tablet  Daily   hydrochlorothiazide 25 MG tablet TAKE 1 TABLET  DAILY    hydrocortisone (ANUSOL-HC)  25 mg ,Rectal, 2 times daily, For 7 days   KRILL OIL  Daily   MAGNESIUM   500 mg  Daily   metFORMIN  XR) 500 MG  Take 1 tablet daily with Meals for Diabetes   MOUNJARO 5 MG/0.5ML Pen INJECT 0.5ML Carlyss ONCE A WEEK    Multiple Vitamins-Minerals  1 tablet, Daily   Omeprazole  20 mg Daily   pseudoephedrine 120 MG 12 hr tablet Take  1 tablet  2 x /day   solifenacin (VESICARE) 5 MG tablet TAKE 1 TABLET DAILY    No Known Allergies    Past Medical History:  Diagnosis Date   Allergy    Cataract    bilateral lens implants   GERD (gastroesophageal reflux disease)  Hyperlipidemia    Hypertension    Prediabetes    Vitamin D deficiency      Health Maintenance  Topic Date Due   Zoster Vaccines- Shingrix (1 of 2) Never done   OPHTHALMOLOGY EXAM  11/21/2019   COVID-19 Vaccine (4 - Booster for Pfizer series) 12/24/2019   FOOT EXAM  04/22/2021   HEMOGLOBIN A1C  07/01/2021   INFLUENZA VACCINE  08/16/2021   TETANUS/TDAP  08/10/2022   Pneumonia Vaccine 24+ Years old  Completed   DEXA SCAN  Completed   Hepatitis C Screening  Completed   HPV VACCINES  Aged Out     Immunization History  Administered Date(s) Administered   Influenza Split 10/31/2012   Influenza, High Dose  09/13/2016, 11/08/2017   Influenza 09/26/2014, 10/06/2015, 09/04/2018   PFIZER SARS-COV-2 Vacc 03/09/2019, 04/02/2019, 10/29/2019   Pneumococcal -13 03/09/2014   Pneumococcal 23 08/06/2015   Td 08/09/2012   Zoster, Live 08/14/2012    Last Colon - 05/27/2020 -  Dr Myrtie Neither - Recc no f/u due to age   Last MGM - 12/24/2020   Past Surgical History:  Procedure Laterality Date   ABDOMINAL HYSTERECTOMY Bilateral 1977   bso   COLONOSCOPY  05/27/2020   2012-2 TA's   KNEE SURGERY Left 09/2009   had ORIF and then hardware removed; Dr. Carola Frost     Family History  Problem  Relation Age of Onset   Heart disease Mother    Ulcers Father    Heart disease Brother    Hypertension Brother    Kidney disease Brother    Colon polyps Brother    Colon polyps Sister    Stroke Maternal Grandmother    Colon cancer Neg Hx    Esophageal cancer Neg Hx    Stomach cancer Neg Hx    Rectal cancer Neg Hx      Social History   Tobacco Use   Smoking status: Former    Packs/day: 0.75    Years: 34.00    Pack years: 25.50    Types: Cigarettes    Start date: 45    Quit date: 01/16/1994    Years since quitting: 27.2   Smokeless tobacco: Never  Vaping Use   Vaping Use: Never used  Substance Use Topics   Alcohol use: Yes    Comment: occasional   Drug use: Never      ROS Constitutional: Denies fever, chills, weight loss/gain, headaches, insomnia,  night sweats, and change in appetite. Does c/o fatigue. Eyes: Denies redness, blurred vision, diplopia, discharge, itchy, watery eyes.  ENT: Denies discharge, congestion, post nasal drip, epistaxis, sore throat, earache, hearing loss, dental pain, Tinnitus, Vertigo, Sinus pain, snoring.  Cardio: Denies chest pain, palpitations, irregular heartbeat, syncope, dyspnea, diaphoresis, orthopnea, PND, claudication, edema Respiratory: denies cough, dyspnea, DOE, pleurisy, hoarseness, laryngitis, wheezing.  Gastrointestinal: Denies dysphagia, heartburn, reflux, water brash, pain, cramps, nausea, vomiting, bloating, diarrhea, constipation, hematemesis, melena, hematochezia, jaundice, hemorrhoids Genitourinary: Denies dysuria, frequency, urgency, nocturia, hesitancy, discharge, hematuria, flank pain Breast: Breast lumps, nipple discharge, bleeding.  Musculoskeletal: Denies arthralgia, myalgia, stiffness, Jt. Swelling, pain, limp, and strain/sprain. Denies falls. Skin: Denies puritis, rash, hives, warts, acne, eczema, changing in skin lesion Neuro: No weakness, tremor, incoordination, spasms, paresthesia, pain Psychiatric: Denies  confusion, memory loss, sensory loss. Denies Depression. Endocrine: Denies change in weight, skin, hair change, nocturia, and paresthesia, diabetic polys, visual blurring, hyper / hypo glycemic episodes.  Heme/Lymph: No excessive bleeding, bruising, enlarged lymph nodes.  Physical Exam  There were no vitals  taken for this visit.  General Appearance: Well nourished, well groomed and in no apparent distress.  Eyes: PERRLA, EOMs, conjunctiva no swelling or erythema, normal fundi and vessels. Sinuses: No frontal/maxillary tenderness ENT/Mouth: EACs patent / TMs  nl. Nares clear without erythema, swelling, mucoid exudates. Oral hygiene is good. No erythema, swelling, or exudate. Tongue normal, non-obstructing. Tonsils not swollen or erythematous. Hearing normal.  Neck: Supple, thyroid not palpable. No bruits, nodes or JVD. Respiratory: Respiratory effort normal.  BS equal and clear bilateral without rales, rhonci, wheezing or stridor. Cardio: Heart sounds are normal with regular rate and rhythm and no murmurs, rubs or gallops. Peripheral pulses are normal and equal bilaterally without edema. No aortic or femoral bruits. Chest: symmetric with normal excursions and percussion. Breasts: Symmetric, without lumps, nipple discharge, retractions, or fibrocystic changes.  Abdomen: Flat, soft with bowel sounds active. Nontender, no guarding, rebound, hernias, masses, or organomegaly.  Lymphatics: Non tender without lymphadenopathy.  Musculoskeletal: Full ROM all peripheral extremities, joint stability, 5/5 strength, and normal gait. Skin: Warm and dry without rashes, lesions, cyanosis, clubbing or  ecchymosis.  Neuro: Cranial nerves intact, reflexes equal bilaterally. Normal muscle tone, no cerebellar symptoms. Sensation intact.  Pysch: Alert and oriented X 3, normal affect, Insight and Judgment appropriate.    Assessment and Plan  1. Annual Preventative Screening Examination   2. Essential  hypertension  - EKG 12-Lead - Urinalysis, Routine w reflex microscopic - Microalbumin / creatinine urine ratio - CBC with Differential/Platelet - COMPLETE METABOLIC PANEL WITH GFR - Magnesium - TSH \  3. Hyperlipidemia associated with type 2 diabetes mellitus (HCC)  - EKG 12-Lead - TSH   4. Type 2 diabetes mellitus with stage 2 chronic kidney                                            disease, without long-term current use of insulin (HCC)  - EKG 12-Lead - Hemoglobin A1c - Insulin, random   5. Vitamin D deficiency  - VITAMIN D 25 Hydroxy    6. Screening for colorectal cancer  - POC Hemoccult Bld/Stl    7. Screening for heart disease  - EKG 12-Lead    8. FHx: heart disease  - EKG 12-Lead   9. Former smoker  - EKG 12-Lead   10. Medication management  - Urinalysis, Routine w reflex microscopic - Microalbumin / creatinine urine ratio - CBC with Differential/Platelet - COMPLETE METABOLIC PANEL WITH GFR - Magnesium - Lipid panel - TSH - Hemoglobin A1c - Insulin, random - VITAMIN D 25 Hydroxy           Patient was counseled in prudent diet to achieve/maintain BMI less than 25 for weight control, BP monitoring, regular exercise and medications. Discussed med's effects and SE's. Screening labs and tests as requested with regular follow-up as recommended. Over 40 minutes of exam, counseling, chart review and high complex critical decision making was performed.   Marinus Maw, MD

## 2022-07-12 ENCOUNTER — Encounter: Payer: Self-pay | Admitting: Internal Medicine

## 2022-07-12 LAB — COMPLETE METABOLIC PANEL WITH GFR
AG Ratio: 1.5 (calc) (ref 1.0–2.5)
ALT: 23 U/L (ref 6–29)
AST: 27 U/L (ref 10–35)
Albumin: 4.3 g/dL (ref 3.6–5.1)
Alkaline phosphatase (APISO): 77 U/L (ref 37–153)
BUN: 11 mg/dL (ref 7–25)
CO2: 30 mmol/L (ref 20–32)
Calcium: 10.6 mg/dL — ABNORMAL HIGH (ref 8.6–10.4)
Chloride: 104 mmol/L (ref 98–110)
Creat: 0.85 mg/dL (ref 0.60–1.00)
Globulin: 2.8 g/dL (calc) (ref 1.9–3.7)
Glucose, Bld: 95 mg/dL (ref 65–99)
Potassium: 4.1 mmol/L (ref 3.5–5.3)
Sodium: 143 mmol/L (ref 135–146)
Total Bilirubin: 0.8 mg/dL (ref 0.2–1.2)
Total Protein: 7.1 g/dL (ref 6.1–8.1)
eGFR: 71 mL/min/{1.73_m2} (ref >=60)

## 2022-07-12 LAB — CBC WITH DIFFERENTIAL/PLATELET
Absolute Monocytes: 475 cells/uL (ref 200–950)
Basophils Absolute: 48 cells/uL (ref 0–200)
Basophils Relative: 1 %
Eosinophils Absolute: 101 cells/uL (ref 15–500)
Eosinophils Relative: 2.1 %
HCT: 39.9 % (ref 35.0–45.0)
Hemoglobin: 13 g/dL (ref 11.7–15.5)
Lymphs Abs: 1699 cells/uL (ref 850–3900)
MCH: 28 pg (ref 27.0–33.0)
MCHC: 32.6 g/dL (ref 32.0–36.0)
MCV: 85.8 fL (ref 80.0–100.0)
MPV: 11.2 fL (ref 7.5–12.5)
Monocytes Relative: 9.9 %
Neutro Abs: 2477 cells/uL (ref 1500–7800)
Neutrophils Relative %: 51.6 %
Platelets: 242 10*3/uL (ref 140–400)
RBC: 4.65 10*6/uL (ref 3.80–5.10)
RDW: 14 % (ref 11.0–15.0)
Total Lymphocyte: 35.4 %
WBC: 4.8 10*3/uL (ref 3.8–10.8)

## 2022-07-12 LAB — URINALYSIS, ROUTINE W REFLEX MICROSCOPIC
Bacteria, UA: NONE SEEN /HPF
Bilirubin Urine: NEGATIVE
Glucose, UA: NEGATIVE
Hgb urine dipstick: NEGATIVE
Hyaline Cast: NONE SEEN /LPF
Ketones, ur: NEGATIVE
Nitrite: NEGATIVE
Protein, ur: NEGATIVE
RBC / HPF: NONE SEEN /HPF (ref 0–2)
Specific Gravity, Urine: 1.012 (ref 1.001–1.035)
WBC, UA: NONE SEEN /HPF (ref 0–5)
pH: 6.5 (ref 5.0–8.0)

## 2022-07-12 LAB — MAGNESIUM: Magnesium: 1.8 mg/dL (ref 1.5–2.5)

## 2022-07-12 LAB — MICROSCOPIC MESSAGE

## 2022-07-12 LAB — LIPID PANEL
Cholesterol: 159 mg/dL (ref <200)
HDL: 55 mg/dL (ref >=50)
LDL Cholesterol (Calc): 83 mg/dL (calc)
Non-HDL Cholesterol (Calc): 104 mg/dL (calc) (ref <130)
Total CHOL/HDL Ratio: 2.9 (calc) (ref <5.0)
Triglycerides: 114 mg/dL (ref <150)

## 2022-07-12 LAB — MICROALBUMIN / CREATININE URINE RATIO
Creatinine, Urine: 106 mg/dL (ref 20–275)
Microalb Creat Ratio: 8 mg/g creat (ref <30)
Microalb, Ur: 0.8 mg/dL

## 2022-07-12 LAB — VITAMIN D 25 HYDROXY (VIT D DEFICIENCY, FRACTURES): Vit D, 25-Hydroxy: 91 ng/mL (ref 30–100)

## 2022-07-12 LAB — INSULIN, RANDOM: Insulin: 10.8 u[IU]/mL

## 2022-07-12 LAB — HEMOGLOBIN A1C
Hgb A1c MFr Bld: 6.1 % of total Hgb — ABNORMAL HIGH (ref <5.7)
Mean Plasma Glucose: 128 mg/dL
eAG (mmol/L): 7.1 mmol/L

## 2022-07-12 LAB — TSH: TSH: 1.23 mIU/L (ref 0.40–4.50)

## 2022-07-12 MED ORDER — METFORMIN HCL ER 500 MG PO TB24: ORAL_TABLET | ORAL | 3 refills | Status: AC

## 2022-07-12 NOTE — Progress Notes (Signed)
^<^<^<^<^<^<^<^<^<^<^<^<^<^<^<^<^<^<^<^<^<^<^<^<^<^<^<^<^<^<^<^<^<^<^<^<^ ^>^>^>^>^>^>^>^>^>^>^>>^>^>^>^>^>^>^>^>^>^>^>^>^>^>^>^>^>^>^>^>^>^>^>^>^>  -  Test results slightly outside the reference range are not unusual. If there is anything important, I will review this with you,  otherwise it is considered normal test values.  If you have further questions,  please do not hesitate to contact me at the office or via My Chart.   ^<^<^<^<^<^<^<^<^<^<^<^<^<^<^<^<^<^<^<^<^<^<^<^<^<^<^<^<^<^<^<^<^<^<^<^<^ ^>^>^>^>^>^>^>^>^>^>^>^>^>^>^>^>^>^>^>^>^>^>^>^>^>^>^>^>^>^>^>^>^>^>^>^>^  -  A1c - 12 week average blood sugar = 6.1% - is slightly elevated , So   - Avoid Sweets, Candy & White Stuff   - White Rice, White Potatoes, White Flour  - Breads &  Pasta ^>^>^>^>^>^>^>^>^>^>^>^>^>^>^>^>^>^>^>^>^>^>^>^>^>^>^>^>^>^>^>^>^>^>^>^>^  -   Magnesium= 1.8 is   very  low- goal is betw 2.0 - 2.5,   - So......Marland Kitchen    Recommend that you RE-START &                                                     Take Magnesium 500 mg tablet 3 x / day with Meals    - also important to eat lots of  leafy green vegetables   - spinach - Kale - collards - greens - okra - asparagus   - broccoli - quinoa - squash - almonds   - black, red, white beans -  peas - green beans ^>^>^>^>^>^>^>^>^>^>^>^>^>^>^>^>^>^>^>^>^>^>^>^>^>^>^>^>^>^>^>^>^>^>^>^>^  -  Chol = 159   Excellent   - Very low risk for Heart Attack  / Stroke  ^>^>^>^>^>^>^>^>^>^>^>^>^>^>^>^>^>^>^>^>^>^>^>^>^>^>^>^>^>^>^>^>^>^>^>^>^ ^>^>^>^>^>^>^>^>^>^>^>^>^>^>^>^>^>^>^>^>^>^>^>^>^>^>^>^>^>^>^>^>^>^>^>^>^  -  Vitamin D = 91 - Excellent - Please keep dosage same  ^>^>^>^>^>^>^>^>^>^>^>^>^>^>^>^>^>^>^>^>^>^>^>^>^>^>^>^>^>^>^>^>^>^>^>^>^ ^>^>^>^>^>^>^>^>^>^>^>^>^>^>^>^>^>^>^>^>^>^>^>^>^>^>^>^>^>^>^>^>^>^>^>^>^  - All Else - CBC - Kidneys - Electrolytes - Liver - Magnesium & Thyroid    - all  Normal /  OK ^>^>^>^>^>^>^>^>^>^>^>^>^>^>^>^>^>^>^>^>^>^>^>^>^>^>^>^>^>^>^>^>^>^>^>^>^ ^>^>^>^>^>^>^>^>^>^>^>^>^>^>^>^>^>^>^>^>^>^>^>^>^>^>^>^>^>^>^>^>^>^>^>^>^

## 2022-08-01 ENCOUNTER — Other Ambulatory Visit: Payer: Self-pay

## 2022-08-01 DIAGNOSIS — Z1212 Encounter for screening for malignant neoplasm of rectum: Secondary | ICD-10-CM

## 2022-08-01 DIAGNOSIS — Z1211 Encounter for screening for malignant neoplasm of colon: Secondary | ICD-10-CM

## 2022-08-01 LAB — POC HEMOCCULT BLD/STL (HOME/3-CARD/SCREEN)
Card #2 Fecal Occult Blod, POC: NEGATIVE
Card #3 Fecal Occult Blood, POC: NEGATIVE
Fecal Occult Blood, POC: NEGATIVE

## 2022-09-20 ENCOUNTER — Other Ambulatory Visit: Payer: Self-pay | Admitting: Internal Medicine

## 2022-09-25 ENCOUNTER — Other Ambulatory Visit: Payer: Self-pay | Admitting: Nurse Practitioner

## 2022-10-12 ENCOUNTER — Encounter: Payer: Self-pay | Admitting: Nurse Practitioner

## 2022-10-12 ENCOUNTER — Ambulatory Visit (INDEPENDENT_AMBULATORY_CARE_PROVIDER_SITE_OTHER): Payer: Medicare Other | Admitting: Nurse Practitioner

## 2022-10-12 VITALS — BP 110/74 | HR 82 | Temp 97.8°F | Ht 64.0 in | Wt 135.6 lb

## 2022-10-12 DIAGNOSIS — E785 Hyperlipidemia, unspecified: Secondary | ICD-10-CM

## 2022-10-12 DIAGNOSIS — Z6823 Body mass index (BMI) 23.0-23.9, adult: Secondary | ICD-10-CM

## 2022-10-12 DIAGNOSIS — N200 Calculus of kidney: Secondary | ICD-10-CM | POA: Diagnosis not present

## 2022-10-12 DIAGNOSIS — E1122 Type 2 diabetes mellitus with diabetic chronic kidney disease: Secondary | ICD-10-CM

## 2022-10-12 DIAGNOSIS — N3281 Overactive bladder: Secondary | ICD-10-CM | POA: Diagnosis not present

## 2022-10-12 DIAGNOSIS — E1169 Type 2 diabetes mellitus with other specified complication: Secondary | ICD-10-CM

## 2022-10-12 DIAGNOSIS — Z79899 Other long term (current) drug therapy: Secondary | ICD-10-CM

## 2022-10-12 DIAGNOSIS — E559 Vitamin D deficiency, unspecified: Secondary | ICD-10-CM

## 2022-10-12 DIAGNOSIS — E1140 Type 2 diabetes mellitus with diabetic neuropathy, unspecified: Secondary | ICD-10-CM

## 2022-10-12 DIAGNOSIS — Z9109 Other allergy status, other than to drugs and biological substances: Secondary | ICD-10-CM

## 2022-10-12 DIAGNOSIS — K219 Gastro-esophageal reflux disease without esophagitis: Secondary | ICD-10-CM

## 2022-10-12 DIAGNOSIS — N182 Chronic kidney disease, stage 2 (mild): Secondary | ICD-10-CM

## 2022-10-12 DIAGNOSIS — I10 Essential (primary) hypertension: Secondary | ICD-10-CM | POA: Diagnosis not present

## 2022-10-12 LAB — CBC WITH DIFFERENTIAL/PLATELET
Absolute Monocytes: 383 cells/uL (ref 200–950)
Basophils Absolute: 39 cells/uL (ref 0–200)
Basophils Relative: 0.9 %
Eosinophils Absolute: 90 cells/uL (ref 15–500)
Eosinophils Relative: 2.1 %
HCT: 41.4 % (ref 35.0–45.0)
Hemoglobin: 13.2 g/dL (ref 11.7–15.5)
Lymphs Abs: 1858 cells/uL (ref 850–3900)
MCH: 27.7 pg (ref 27.0–33.0)
MCHC: 31.9 g/dL — ABNORMAL LOW (ref 32.0–36.0)
MCV: 86.8 fL (ref 80.0–100.0)
MPV: 10.4 fL (ref 7.5–12.5)
Monocytes Relative: 8.9 %
Neutro Abs: 1931 cells/uL (ref 1500–7800)
Neutrophils Relative %: 44.9 %
Platelets: 283 10*3/uL (ref 140–400)
RBC: 4.77 10*6/uL (ref 3.80–5.10)
RDW: 13.8 % (ref 11.0–15.0)
Total Lymphocyte: 43.2 %
WBC: 4.3 10*3/uL (ref 3.8–10.8)

## 2022-10-12 NOTE — Patient Instructions (Signed)

## 2022-10-12 NOTE — Progress Notes (Signed)
FOLLOW UP  Assessment:   Essential hypertension Continue Enalaprill, HCTZ Discussed DASH (Dietary Approaches to Stop Hypertension) DASH diet is lower in sodium than a typical American diet. Cut back on foods that are high in saturated fat, cholesterol, and trans fats. Eat more whole-grain foods, fish, poultry, and nuts Remain active and exercise as tolerated daily.  Monitor BP at home-Call if greater than 130/80.  Check CMP/CBC  Gastroesophageal reflux disease, esophagitis presence not specified Continue Omeprazole PRN No suspected reflux complications (Barret/stricture). Lifestyle modification:  wt loss, avoid meals 2-3h before bedtime. Consider eliminating food triggers:  chocolate, caffeine, EtOH, acid/spicy food.   OAB (overactive bladder) Continue Vesicare. Follows with Urology Continue to monitor  Nephrolithiasis Stay well hydrated Followed by Dr. Sherron Monday PRN Continue to monitor  Vitamin D deficiency At goal at recent check; continue to recommend supplementation for goal of 70-100 Monitor Vitamin D levels  T2DM with CKD  Continue Metformin, Mounjaro Education: Reviewed 'ABCs' of diabetes management  Discussed goals to be met and/or maintained include A1C (<7) Blood pressure (<130/80) Cholesterol (LDL <70) Continue Eye Exam yearly  Continue Dental Exam Q6 mo Discussed dietary recommendations Discussed Physical Activity recommendations Discussed how what you eat and drink can aide in kidney protection. Stay well hydrated. Avoid high salt foods. Avoid NSAIDS. Keep BP and BG well controlled.   Take medications as prescribed. Remain active and exercise as tolerated daily. Maintain weight.  Continue to monitor. Check CMP/GFR/A1c  Mixed hyperlipidemia associated with T2DM (HCC) Continue Atorvastatin Discussed lifestyle modifications. Recommended diet heavy in fruits and veggies, omega 3's. Decrease consumption of animal meats, cheeses, and dairy  products. Remain active and exercise as tolerated. Continue to monitor. Check lipids/TSH  T2DM with sensory neuropathy (HCC) Monitor feet daily, declines meds  BMI 23 - normal weight Discussed appropriate BMI Diet modification. Physical activity. Encouraged/praised to build confidence.  Medication management All medications discussed and reviewed in full. All questions and concerns regarding medications addressed.    Environmental allergies H2 PRN Saline irrigations/flonase PRN,  Allergy hygiene explained. Follow up if not improving or with progressive sx  Orders Placed This Encounter  Procedures   CBC with Differential/Platelet   COMPLETE METABOLIC PANEL WITH GFR   Lipid panel   Hemoglobin A1c   Notify office for further evaluation and treatment, questions or concerns if any reported s/s fail to improve.   The patient was advised to call back or seek an in-person evaluation if any symptoms worsen or if the condition fails to improve as anticipated.   Further disposition pending results of labs. Discussed med's effects and SE's.    I discussed the assessment and treatment plan with the patient. The patient was provided an opportunity to ask questions and all were answered. The patient agreed with the plan and demonstrated an understanding of the instructions.  Discussed med's effects and SE's. Screening labs and tests as requested with regular follow-up as recommended.  I provided 30 minutes of face-to-face time during this encounter including counseling, chart review, and critical decision making was preformed.  Future Appointments  Date Time Provider Department Center  01/16/2023 10:30 AM Lucky Cowboy, MD [facility] None  04/16/2023 10:30 AM Adela Glimpse, NP [facility] None  08/02/2023 11:00 AM Lucky Cowboy, MD [facility] None     Subjective:  Kaitlyn Townsend is a 77 y.o. female who presents for a follow up. 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   Overall she reports feeling well today.  She is followed by Dr. Sherron Monday as needed at [facility] urology for recurrent nephrolithiasis.  She follows PRN.  Denies recent flare.    She is on estrace 1 mg daily following total hysterectomy and doing well with this.   She is on bASA and she is active. She has now tapered off of estrogen.  She takes Vesicare for OAB is working well for her - denies recent flare.  She has GERD on omeprazole 20 mg daily. Currently well controlled. Takes PRN.  BMI is Body mass index is 23.28 kg/m., she has been working on diet and exercise.  Recently on Mounjaro, stopped 3 mo ago d/t supply.  Has some SE of mild constipation.  Wt Readings from Last 3 Encounters:  10/12/22 135 lb 9.6 oz (61.5 kg)  07/11/22 142 lb (64.4 kg)  03/13/22 143 lb (64.9 kg)    Her blood pressure has been controlled at home, today their BP is BP: 110/74 She does workout. She denies chest pain, shortness of breath, dizziness.   She is on cholesterol medication (atorvastatin 80 mg daily) and denies myalgias. Her LDL cholesterol is not at goal of LDL <70 at last check. The cholesterol last visit was:   Lab Results  Component Value Date   CHOL 159 07/11/2022   HDL 55 07/11/2022   LDLCALC 83 07/11/2022   TRIG 114 07/11/2022   CHOLHDL 2.9 07/11/2022    She has been working on diet and exercise for T2DM on metformin 500 mg daily and  denies foot ulcerations, increased appetite, nausea, paresthesia of the feet, polydipsia, polyuria, visual disturbances, vomiting and weight loss. She does check fasting sugars, range (80s-110s). Last A1C in the office was:  Lab Results  Component Value Date   HGBA1C 6.1 (H) 07/11/2022   Last GFR: Lab Results  Component Value Date   [facility] 04/22/2020   Patient is on Vitamin D supplement and approaching goal at recent check, alternates 5000 IU and 13086 IU:    Lab Results  Component Value Date   VD25OH 91 07/11/2022       Medication Review:  Current Outpatient Medications (Endocrine & Metabolic):    metFORMIN (GLUCOPHAGE XR) 500 MG 24 hr tablet, Take 1 to 2 tablets   2 x /day with Meals for Diabetes   MOUNJARO 5 MG/0.5ML Pen, INJECT 0.5ML SUBCUTANEOUSLY ONCE A WEEK FOR DIABETES (Patient not taking: Reported on 10/12/2022)  Current Outpatient Medications (Cardiovascular):    atorvastatin (LIPITOR) 80 MG tablet, Take  1 tablet  Daily for Cholesterol                                                            /  TAKE                                                      BY                                                 MOUTH                                                   ONCE  ?   DAILY   enalapril (VASOTEC) 20 MG tablet, Take 1 tablet by mouth once daily for blood pressure   hydrochlorothiazide (HYDRODIURIL) 25 MG tablet, TAKE 1 TABLET BY MOUTH ONCE DAILY FOR BLOOD PRESSURE FLUID  RETENTION  AND  ANKLE  SWELLING  Current Outpatient Medications (Respiratory):    cetirizine (ZYRTEC) 10 MG tablet, TAKE 1 TABLET BY MOUTH ONCE DAILY AS NEEDED FOR ALLERGIES   pseudoephedrine (SUDAFED) 120 MG 12 hr tablet, Take  1 tablet  2 x /day (every 12 hours)  for Sinus & Chest Congestion  Current Outpatient Medications (Analgesics):    aspirin 81 MG chewable tablet, Chew by mouth every other day.   Current Outpatient Medications (Other):     blood glucose meter kit and supplies, Test sugars as directed by provider. Dispense based insurance preference. E11.22   Blood Glucose Monitoring Suppl (ACCU-CHEK AVIVA PLUS) w/Device KIT, Check blood sugar 1 time daily-DX-R73.09   Cholecalciferol (VITAMIN D PO), Take 5,000 Int'l Units by mouth daily. Take 10,000 units M,W,F and 5000 units all other days   glucose blood test strip, Use to check blood sugar once daily.   hydrocortisone (ANUSOL-HC) 25 MG suppository, Place 1 suppository (25 mg total) rectally 2 (two) times daily. For 7 days   KRILL OIL PO, Take by mouth daily.   Lancets (ACCU-CHEK SOFT TOUCH) lancets, Check blood sugar 1 time daily-DX-R73.09   Multiple Vitamins-Minerals (MULTIVITAMIN WITH MINERALS) tablet, Take 1 tablet by mouth daily.   omeprazole (PRILOSEC) 20 MG capsule, Take 1 capsule (20 mg total) by mouth daily. (Patient taking differently: Take 20 mg by mouth as needed.)   solifenacin (VESICARE) 5 MG tablet, TAKE 1 TABLET BY MOUTH ONCE DAILY FOR BLADDER CONTROL   MAGNESIUM PO, Take 500 mg by mouth daily. 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, without long-term current use of insulin (HCC) 11/22/2017   Former smoker (25 pack year, quit 1996) 08/21/2017   FHx: heart disease 08/21/2017   Recurrent cold sores 05/14/2017   Nephrolithiasis 02/16/2016   OAB (overactive bladder) 02/16/2016   Medication management 02/13/2013   Hyperlipidemia associated with type 2 diabetes mellitus (HCC)    Essential hypertension    Vitamin D deficiency  GERD (gastroesophageal reflux disease)      Screening Tests Immunization History  Administered Date(s) Administered   Fluad Quad(high Dose 65+) 09/22/2022   Influenza Split 10/31/2012   Influenza, High Dose Seasonal PF 09/13/2016, 11/08/2017   Influenza-Unspecified 09/26/2014, 10/06/2015, 09/04/2018   PFIZER(Purple Top)SARS-COV-2 Vaccination 03/09/2019, 04/02/2019, 10/29/2019   Pfizer(Comirnaty)Fall Seasonal Vaccine 12 years and older 09/22/2022   Pneumococcal Conjugate-13 03/09/2014   Pneumococcal Polysaccharide-23 08/06/2015   Td 08/09/2012   Zoster, Live 08/14/2012   Patient Care Team: Lucky Cowboy, MD as PCP - General (Internal Medicine) Louis Meckel, MD (Inactive) as Consulting Physician (Gastroenterology) Alfredo Martinez, MD as Consulting Physician (Urology)  SURGICAL HISTORY She  has a past surgical history that includes Knee surgery (Left, 09/2009); Abdominal hysterectomy (Bilateral, 1977); and Colonoscopy (05/27/2020). FAMILY HISTORY Her family history includes Colon polyps in her brother and sister; Heart disease in her brother and mother; Hypertension in her brother; Kidney disease in her brother; Stroke in her maternal grandmother; Ulcers in her father. SOCIAL HISTORY She  reports that she quit smoking about 28 years ago. Her smoking use included cigarettes. She started smoking about 52 years ago. She has a 25.5 pack-year smoking history. She has never used smokeless tobacco. She reports current alcohol use. She reports that she does not use drugs.  Review of Systems  Constitutional:  Negative for fever, malaise/fatigue and weight loss.  HENT:  Negative for congestion, hearing loss, sinus pain, sore throat and tinnitus.   Eyes:  Negative for blurred vision and double vision.  Respiratory:  Negative for cough, sputum production, shortness of breath and wheezing.   Cardiovascular:  Negative for chest pain, palpitations, orthopnea, claudication, leg swelling and PND.  Gastrointestinal:  Negative for  abdominal pain, blood in stool, constipation, diarrhea, heartburn, melena, nausea and vomiting.  Genitourinary: Negative.   Musculoskeletal:  Negative for falls, joint pain and myalgias.  Skin:  Negative for itching and rash.  Neurological:  Negative for dizziness, tingling, sensory change, weakness and headaches.  Endo/Heme/Allergies:  Negative for environmental allergies and polydipsia.  Psychiatric/Behavioral: Negative.  Negative for depression, memory loss, substance abuse and suicidal ideas. The patient is not nervous/anxious and does not have insomnia.   All other systems reviewed and are negative.    Objective:     Today's Vitals   10/12/22 1116  BP: 110/74  Pulse: 82  Temp: 97.8 F (36.6 C)  SpO2: 96%  Weight: 135 lb 9.6 oz (61.5 kg)  Height: 5\' 4"  (1.626 m)   Body mass index is 23.28 kg/m.  General appearance: alert, no distress, WD/WN, female HEENT: normocephalic, sclerae anicteric, TMs pearly, nares patent, no discharge or erythema, pharynx normal Oral cavity: MMM, no lesions Neck: supple, no lymphadenopathy, no thyromegaly, no masses Heart: RRR, normal S1, S2, no murmurs Lungs: CTA bilaterally, no wheezes, rhonchi, or rales Abdomen: +bs, soft, non tender, non distended, no masses, no hepatomegaly, no splenomegaly Musculoskeletal: nontender, no swelling, no obvious deformity Extremities: no edema, no cyanosis, no clubbing Pulses: 2+ symmetric, upper and lower extremities, normal cap refill Neurological: alert, oriented x 3, CN2-12 intact, strength normal upper extremities and lower extremities, sensation normal throughout, DTRs 2+ throughout, no cerebellar signs, gait normal Psychiatric: normal affect, behavior normal, pleasant   Tephanie Escorcia, NP   10/12/2022

## 2022-10-13 LAB — COMPLETE METABOLIC PANEL WITH GFR
AG Ratio: 1.6 (calc) (ref 1.0–2.5)
ALT: 25 U/L (ref 6–29)
AST: 26 U/L (ref 10–35)
Albumin: 4.4 g/dL (ref 3.6–5.1)
Alkaline phosphatase (APISO): 68 U/L (ref 37–153)
BUN: 12 mg/dL (ref 7–25)
CO2: 32 mmol/L (ref 20–32)
Calcium: 10.4 mg/dL (ref 8.6–10.4)
Chloride: 102 mmol/L (ref 98–110)
Creat: 0.77 mg/dL (ref 0.60–1.00)
Globulin: 2.8 g/dL (ref 1.9–3.7)
Glucose, Bld: 95 mg/dL (ref 65–99)
Potassium: 3.7 mmol/L (ref 3.5–5.3)
Sodium: 141 mmol/L (ref 135–146)
Total Bilirubin: 0.8 mg/dL (ref 0.2–1.2)
Total Protein: 7.2 g/dL (ref 6.1–8.1)
eGFR: 79 mL/min/{1.73_m2} (ref >=60)

## 2022-10-13 LAB — LIPID PANEL
Cholesterol: 141 mg/dL (ref <200)
HDL: 40 mg/dL — ABNORMAL LOW (ref >=50)
LDL Cholesterol (Calc): 78 mg/dL
Non-HDL Cholesterol (Calc): 101 mg/dL (ref <130)
Total CHOL/HDL Ratio: 3.5 (calc) (ref <5.0)
Triglycerides: 136 mg/dL (ref <150)

## 2022-10-13 LAB — HEMOGLOBIN A1C
Hgb A1c MFr Bld: 5.9 %{Hb} — ABNORMAL HIGH (ref <5.7)
Mean Plasma Glucose: 123 mg/dL
eAG (mmol/L): 6.8 mmol/L

## 2022-12-11 ENCOUNTER — Other Ambulatory Visit: Payer: Self-pay | Admitting: Internal Medicine

## 2022-12-11 DIAGNOSIS — Z0000 Encounter for general adult medical examination without abnormal findings: Secondary | ICD-10-CM

## 2022-12-16 ENCOUNTER — Other Ambulatory Visit: Payer: Self-pay | Admitting: Nurse Practitioner

## 2022-12-25 ENCOUNTER — Other Ambulatory Visit: Payer: Self-pay | Admitting: Nurse Practitioner

## 2023-01-15 ENCOUNTER — Encounter: Payer: Self-pay | Admitting: Internal Medicine

## 2023-01-15 ENCOUNTER — Ambulatory Visit
Admission: RE | Admit: 2023-01-15 | Discharge: 2023-01-15 | Disposition: A | Discharge status: Home / Self Care | Payer: Medicare Other | Source: Ambulatory Visit | Attending: Internal Medicine | Admitting: Internal Medicine

## 2023-01-15 DIAGNOSIS — Z0000 Encounter for general adult medical examination without abnormal findings: Secondary | ICD-10-CM

## 2023-01-15 NOTE — Progress Notes (Signed)
 [name]      ADULT   &   ADOLESCENT      INTERNAL MEDICINE  Kaitlyn Kaitlyn, M.D.          Kaitlyn Kaitlyn, ANP        Townsend Necessary, FNP  [facility] [address]  [place], [place]. [address] Telephone [phone] Telefax [phone]     Future Appointments  Date Time Provider Department  01/16/2023                6 mo ov 10:30 AM Kaitlyn Elsie, MD [facility]  04/16/2023                 wellness 10:30 AM Kaitlyn Bascom, NP [facility]  08/02/2023                  cpe 11:00 AM Kaitlyn Elsie, MD [facility]    History of Present Illness:       This very nice 77 y.o.  DBF presents for 6 month follow up with HTN, HLD, T2_NIDDM and Vitamin D  Deficiency.  Patient is also c/o several day hx/o sinus congestion & yellowish green PND , ST , dry cough . No Sputum.       Patient is treated for HTN (1995) & BP has been controlled at home. Today's BP is at goal - 134/70 .   Patient has had no complaints of any cardiac type chest pain, palpitations, dyspnea chet /PND, dizziness, claudication or dependent edema.        Hyperlipidemia is controlled with diet & meds. Patient denies myalgias or other med SE's. Last Lipids were at goal :  Lab Results  Component Value Date   CHOL 141 10/12/2022   HDL 40 (L) 10/12/2022   LDLCALC 78 10/12/2022   TRIG 136 10/12/2022   CHOLHDL 3.5 10/12/2022     Also, the patient has history of Pre_DM (A1c 6.0% /2011) and then T2_NIDDM (A1c 7.4% /June 2016) and she 's had CKD3 (GFR 78) in  Mar 2021. She has had no symptoms of reactive hypoglycemia, diabetic polys, paresthesias or visual blurring.  Patient has lost  27# weight via better eating from 166# in Aug 2019 to current wt 146 #.  She has tapered her Metformin  to 1 tablet /Daily.  Last A1c was near goal :  Lab Results  Component Value Date   HGBA1C 5.9 (H) 10/12/2022    Wt Readings from Last 3 Encounters:  01/16/23 135 lb 3.2 oz (61.3 kg)  10/12/22 135 lb 9.6  oz (61.5 kg)  07/11/22 142 lb (64.4 kg)            Further, the patient also has history of Vitamin D  Deficiency and supplements vitamin D  without any suspected side-effects. Last vitamin D  was at goal :   Lab Results  Component Value Date   VD25OH 87 08/30/2021       Current Outpatient Medications  Medication Instructions   aspirin 81 MG tablet Every other day   atorvastatin   80 MG tablet Take  1 tablet  Daily    cetirizine  10 MG tablet TAKE 1 TABLET  DAILY AS NEEDED     VITAMIN D   5,000 Units   Daily, Take 10,000 units M,W,F and 5000 units other days   enalapril  20 MG tablet Take  1 tablet  Daily    estradiol  0.5 MG tablet Take  1 tablet  Daily   hctz 25 MG tablet TAKE 1 TABLET  DAILY  ANUSOL -HC 25 mg supp Rectal, 2 times daily, For 7 days   KRILL OIL PO  Daily   metFORMIN   XL 500 MG  Take 1 tablet daily with Meals for Diabetes   Multiple Vitamins-Minerals  1 tablet Daily   omeprazole20 mg    Daily   pseudoephedrine   120 MG 12 hr tablet Take  1 tablet  2 x /day (every 12 hours)     solifenacin   MG tablet TAKE 1 TABLET  DAILY FOR    No Known Allergies    PMHx:   Past Medical History:  Diagnosis Date   Allergy    GERD (gastroesophageal reflux disease)    Hyperlipidemia    Hypertension    Prediabetes    Vitamin D  deficiency      Immunization History  Administered Date(s) Administered   Influenza Split 10/31/2012   Influenza, High Dose  09/13/2016, 11/08/2017   Influenza 09/26/2014, 10/06/2015, 09/04/2018   PFIZER SARS-COV-2 Vacc 03/09/2019, 04/02/2019   Pneumococcal - 13 03/09/2014   Pneumococcal - 23 08/06/2015   Td 08/09/2012   Zoster 08/14/2012     Past Surgical History:  Procedure Laterality Date   ABDOMINAL HYSTERECTOMY / BSO Bilateral 1977   KNEE SURGERY Left 09/2009     FHx:    Reviewed / unchanged   SHx:    Reviewed / unchanged     Systems Review:  Constitutional: Denies fever, chills, wt changes, headaches, insomnia, fatigue, night  sweats, change in appetite. Eyes: Denies redness, blurred vision, diplopia, discharge, itchy, watery eyes.  ENT: Denies discharge, congestion, post nasal drip, epistaxis, sore throat, earache, hearing loss, dental pain, tinnitus, vertigo, sinus pain, snoring.  CV: Denies chest pain, palpitations, irregular heartbeat, syncope, dyspnea, diaphoresis, orthopnea, PND, claudication or edema. Respiratory: denies cough, dyspnea, DOE, pleurisy, hoarseness, laryngitis, wheezing.  Gastrointestinal: Denies dysphagia, odynophagia, heartburn, reflux, water brash, abdominal pain or cramps, nausea, vomiting, bloating, diarrhea, constipation, hematemesis, melena, hematochezia  or hemorrhoids. Genitourinary: Denies dysuria, frequency, urgency, nocturia, hesitancy, discharge, hematuria or flank pain. Musculoskeletal: Denies arthralgias, myalgias, stiffness, jt. swelling, pain, limping or strain/sprain.  Skin: Denies pruritus, rash, hives, warts, acne, eczema or change in skin lesion(s). Neuro: No weakness, tremor, incoordination, spasms, paresthesia or pain. Psychiatric: Denies confusion, memory loss or sensory loss. Endo: Denies change in weight, skin or hair change.  Heme/Lymph: No excessive bleeding, bruising or enlarged lymph nodes.  Physical Exam  BP 134/70   Pulse 90   Temp 97.9 F (36.6 C)   Resp 16   Ht 5' 4 (1.626 m)   Wt 135 lb 3.2 oz (61.3 kg)   SpO2 99%   BMI 23.21 kg/m   Appears  well nourished, well groomed  and in no distress.  Eyes: PERRLA, EOMs, conjunctiva no swelling or erythema. Sinuses: (+)  frontal/maxillary tenderness ENT/Mouth: EAC's clear, TM's nl w/o erythema, bulging. Nares clear w/o erythema, swelling, exudates. Oropharynx clear without erythema or exudates. Oral hygiene is good. Tongue normal, non obstructing. Hearing intact.  Neck: Supple. Thyroid not palpable. Car 2+/2+ without bruits, nodes or JVD. Chest: Respirations nl with BS clear & equal w/o rales, rhonchi,  wheezing or stridor.  Cor: Heart sounds normal w/ regular rate and rhythm without sig. murmurs, gallops, clicks or rubs. Peripheral pulses normal and equal  without edema.  Abdomen: Soft & bowel sounds normal. Non-tender w/o guarding, rebound, hernias, masses or organomegaly.  Lymphatics: Unremarkable.  Musculoskeletal: Full ROM all peripheral extremities, joint stability, 5/5 strength and normal gait.  Skin: Warm, dry without  exposed rashes, lesions or ecchymosis apparent.  Neuro: Cranial nerves intact, reflexes equal bilaterally. Sensory-motor testing grossly intact. Tendon reflexes grossly intact.  Pysch: Alert & oriented x 3.  Insight and judgement nl & appropriate. No ideations.  Assessment and Plan:   1. Essential hypertension  - Continue medication, monitor blood pressure at home.  - Continue DASH diet.  Reminder to go to the ER if any CP,  SOB, nausea, dizziness, severe HA, changes vision/speech.  - CBC with Differential/Platelet - COMPLETE METABOLIC PANEL WITH GFR - Magnesium - TSH   2. Hyperlipidemia associated with type 2 diabetes mellitus (HCC)  - Continue diet/meds, exercise,& lifestyle modifications.  - Continue monitor periodic cholesterol/liver & renal functions   - Lipid panel - TSH   3. Diabetes mellitus due to underlying condition with stage 2 chronic  kidney disease, without long-term current use of insulin  (HCC)  - Continue diet, exercise  - Lifestyle modifications.  - Monitor appropriate labs.  - Hemoglobin A1c - Insulin , random   4. Vitamin D  deficiency  - Continue supplementation.  - VITAMIN D  25 Hydroxy    5. Gastroesophageal reflux disease  - CBC with Differential/Platelet   6. Medication management  - CBC with Differential/Platelet - COMPLETE METABOLIC PANEL WITH GFR - Magnesium - Lipid panel - TSH - Hemoglobin A1c - Insulin , random - VITAMIN D  25 Hydroxy          Discussed  regular exercise, BP monitoring, weight control  to achieve/maintain BMI less than 25 and discussed med and SE's. Recommended labs to assess and monitor clinical status with further disposition pending results of labs.  I discussed the assessment and treatment plan with the patient. The patient was provided an opportunity to ask questions and all were answered. The patient agreed with the plan and demonstrated an understanding of the instructions.  I provided over 30 minutes of exam, counseling, chart review and  complex critical decision making.   Kaitlyn JONETTA Richards, MD

## 2023-01-15 NOTE — Patient Instructions (Signed)
 Due to recent changes in healthcare laws, you may see the results of your imaging and laboratory studies on MyChart before your provider has had a chance to review them.  We understand that in some cases there may be results that are confusing or concerning to you. Not all laboratory results come back in the same time frame and the provider may be waiting for multiple results in order to interpret others.  Please give us  48 hours in order for your provider to thoroughly review all the results before contacting the office for clarification of your results.  ++++++++++++++++++++++++++  Vit D  & Vit C 1,000 mg   are recommended to help protect  against the Covid-19 and other Corona viruses.    Also it's recommended  to take  Zinc 50 mg x 1/2 tablet = 25 mg  / Day  to help  protect against the Covid-19   and best place to get  is also on [email or web address]  and don't pay more than 6-8 cents /pill !   +++++++++++++++++++++++++++++++++++++++ Recommend Adult Low Dose Aspirin or  coated  Aspirin 81 mg daily  To reduce risk of Colon Cancer 40 %,  Skin Cancer 26 % ,  Melanoma 46%  and  Pancreatic cancer 60% +++++++++++++++++++++++++++++++++++++++++ Vitamin D  goal  is between 70-100.  Please make sure that you are taking your Vitamin D  as directed.  It is very important as a natural anti-inflammatory  helping hair, skin, and nails, as well as reducing stroke and heart attack risk.  It helps your bones and helps with mood. It also decreases numerous cancer risks so please take it as directed.  Low Vit D is associated with a 200-300% higher risk for CANCER  and 200-300% higher risk for HEART   ATTACK  &  STROKE.   .....................................SABRA It is also associated with higher death rate at younger ages,  autoimmune diseases like Rheumatoid arthritis, Lupus, Multiple Sclerosis.    Also many other serious conditions, like depression, Alzheimer's Dementia, infertility, muscle aches, fatigue,  fibromyalgia - just to name a few. +++++++++++++++++++++++++++++++++++++++++ Recommend the book The END of DIETING by Dr Marty Resides  & the book The END of DIABETES  by Dr Marty Resides At [email or web address] - get book & Audio CD's    Being diabetic has a  300% increased risk for heart attack, stroke, cancer, and alzheimer- type vascular dementia. It is very important that you work harder with diet by avoiding all foods that are white. Avoid white rice (brown & wild rice is OK), white potatoes (sweetpotatoes in moderation is OK), White bread or wheat bread or anything made out of white flour like bagels, donuts, rolls, buns, biscuits, cakes, pastries, cookies, pizza crust, and pasta (made from white flour & egg whites) - vegetarian pasta or spinach or wheat pasta is OK. Multigrain breads like Arnold's or Pepperidge Farm, or multigrain sandwich thins or flatbreads.  Diet, exercise and weight loss can reverse and cure diabetes in the early stages.  Diet, exercise and weight loss is very important in the control and prevention of complications of diabetes which affects every system in your body, ie. Brain - dementia/stroke, eyes - glaucoma/blindness, heart - heart attack/heart failure, kidneys - dialysis, stomach - gastric paralysis, intestines - malabsorption, nerves - severe painful neuritis, circulation - gangrene & loss of a leg(s), and finally cancer and Alzheimers.    I recommend avoid fried & greasy foods,  sweets/candy, white rice (brown or wild rice or Quinoa is  OK), white potatoes (sweet potatoes are OK) - anything made from white flour - bagels, doughnuts, rolls, buns, biscuits,white and wheat breads, pizza crust and traditional pasta made of white flour & egg white(vegetarian pasta or spinach or wheat pasta is OK).  Multi-grain bread is OK - like multi-grain flat bread or sandwich thins. Avoid alcohol in excess. Exercise is also important.    Eat all the vegetables you want - avoid meat, especially red  meat and dairy - especially cheese.  Cheese is the most concentrated form of trans-fats which is the worst thing to clog up our arteries. Veggie cheese is OK which can be found in the fresh produce section at [employer] or [employer] or [employer]  +++++++++++++++++++++++++++++++++++++++ DASH Eating Plan  DASH stands for Dietary Approaches to Stop Hypertension.   The DASH eating plan is a healthy eating plan that has been shown to reduce high blood pressure (hypertension). Additional health benefits may include reducing the risk of type 2 diabetes mellitus, heart disease, and stroke. The DASH eating plan may also help with weight loss. WHAT DO I NEED TO KNOW ABOUT THE DASH EATING PLAN? For the DASH eating plan, you will follow these general guidelines: Choose foods with a percent daily value for sodium of less than 5% (as listed on the food label). Use salt-free seasonings or herbs instead of table salt or sea salt. Check with your health care provider or pharmacist before using salt substitutes. Eat lower-sodium products, often labeled as lower sodium or no salt added. Eat fresh foods. Eat more vegetables, fruits, and low-fat dairy products. Choose whole grains. Look for the word whole as the first word in the ingredient list. Choose fish  Limit sweets, desserts, sugars, and sugary drinks. Choose heart-healthy fats. Eat veggie cheese  Eat more home-cooked food and less restaurant, buffet, and fast food. Limit fried foods. Cook foods using methods other than frying. Limit canned vegetables. If you do use them, rinse them well to decrease the sodium. When eating at a restaurant, ask that your food be prepared with less salt, or no salt if possible.                      WHAT FOODS CAN I EAT? Read Dr Marty Fuhrman's books on The End of Dieting & The End of Diabetes  Grains Whole grain or whole wheat bread. Brown rice. Whole grain or whole wheat pasta. Quinoa, bulgur, and whole  grain cereals. Low-sodium cereals. Corn or whole wheat flour tortillas. Whole grain cornbread. Whole grain crackers. Low-sodium crackers.  Vegetables Fresh or frozen vegetables (raw, steamed, roasted, or grilled). Low-sodium or reduced-sodium tomato and vegetable juices. Low-sodium or reduced-sodium tomato sauce and paste. Low-sodium or reduced-sodium canned vegetables.   Fruits All fresh, canned (in natural juice), or frozen fruits.  Protein Products  All fish and seafood.  Dried beans, peas, or lentils. Unsalted nuts and seeds. Unsalted canned beans.  Dairy Low-fat dairy products, such as skim or 1% milk, 2% or reduced-fat cheeses, low-fat ricotta or cottage cheese, or plain low-fat yogurt. Low-sodium or reduced-sodium cheeses.  Fats and Oils Tub margarines without trans fats. Light or reduced-fat mayonnaise and salad dressings (reduced sodium). Avocado. Safflower, olive, or canola oils. Natural peanut or almond butter.  Other Unsalted popcorn and pretzels. The items listed above may not be a complete list of recommended foods or beverages. Contact your dietitian for more options.  +++++++++++++++  WHAT FOODS ARE NOT RECOMMENDED? Grains/ White flour or  wheat flour White bread. White pasta. White rice. Refined cornbread. Bagels and croissants. Crackers that contain trans fat.  Vegetables  Creamed or fried vegetables. Vegetables in a . Regular canned vegetables. Regular canned tomato sauce and paste. Regular tomato and vegetable juices.  Fruits Dried fruits. Canned fruit in light or heavy syrup. Fruit juice.  Meat and Other Protein Products Meat in general - RED meat & White meat.  Fatty cuts of meat. Ribs, chicken wings, all processed meats as bacon, sausage, bologna, salami, fatback, hot dogs, bratwurst and packaged luncheon meats.  Dairy Whole or 2% milk, cream, half-and-half, and cream cheese. Whole-fat or sweetened yogurt. Full-fat cheeses or blue cheese. Non-dairy creamers  and whipped toppings. Processed cheese, cheese spreads, or cheese curds.  Condiments Onion and garlic salt, seasoned salt, table salt, and sea salt. Canned and packaged gravies. Worcestershire sauce. Tartar sauce. Barbecue sauce. Teriyaki sauce. Soy sauce, including reduced sodium. Steak sauce. Fish sauce. Oyster sauce. Cocktail sauce. Horseradish. Ketchup and mustard. Meat flavorings and tenderizers. Bouillon cubes. Hot sauce. Tabasco sauce. Marinades. Taco seasonings. Relishes.  Fats and Oils Butter, stick margarine, lard, shortening and bacon fat. Coconut, palm kernel, or palm oils. Regular salad dressings.  Pickles and olives. Salted popcorn and pretzels.  The items listed above may not be a complete list of foods and beverages to avoid.

## 2023-01-16 ENCOUNTER — Ambulatory Visit (INDEPENDENT_AMBULATORY_CARE_PROVIDER_SITE_OTHER): Payer: Medicare Other | Admitting: Internal Medicine

## 2023-01-16 VITALS — BP 134/70 | HR 90 | Temp 97.9°F | Resp 16 | Ht 64.0 in | Wt 135.2 lb

## 2023-01-16 DIAGNOSIS — Z1152 Encounter for screening for COVID-19: Secondary | ICD-10-CM | POA: Diagnosis not present

## 2023-01-16 DIAGNOSIS — E559 Vitamin D deficiency, unspecified: Secondary | ICD-10-CM

## 2023-01-16 DIAGNOSIS — J0140 Acute pansinusitis, unspecified: Secondary | ICD-10-CM

## 2023-01-16 DIAGNOSIS — I10 Essential (primary) hypertension: Secondary | ICD-10-CM | POA: Diagnosis not present

## 2023-01-16 DIAGNOSIS — E785 Hyperlipidemia, unspecified: Secondary | ICD-10-CM

## 2023-01-16 DIAGNOSIS — E1169 Type 2 diabetes mellitus with other specified complication: Secondary | ICD-10-CM

## 2023-01-16 DIAGNOSIS — E1122 Type 2 diabetes mellitus with diabetic chronic kidney disease: Secondary | ICD-10-CM

## 2023-01-16 DIAGNOSIS — G4483 Primary cough headache: Secondary | ICD-10-CM

## 2023-01-16 DIAGNOSIS — K219 Gastro-esophageal reflux disease without esophagitis: Secondary | ICD-10-CM | POA: Diagnosis not present

## 2023-01-16 DIAGNOSIS — Z79899 Other long term (current) drug therapy: Secondary | ICD-10-CM

## 2023-01-16 DIAGNOSIS — N182 Chronic kidney disease, stage 2 (mild): Secondary | ICD-10-CM

## 2023-01-16 LAB — POC COVID19 BINAXNOW: SARS Coronavirus 2 Ag: NEGATIVE

## 2023-01-16 MED ORDER — IPRATROPIUM BROMIDE 0.06 % NA SOLN: NASAL | 3 refills | Status: AC

## 2023-01-16 MED ORDER — AZITHROMYCIN 250 MG PO TABS
ORAL_TABLET | ORAL | 1 refills | Status: AC
Start: 2023-01-16 — End: ?

## 2023-01-16 MED ORDER — DEXAMETHASONE 2 MG PO TABS: ORAL_TABLET | ORAL | 0 refills | Status: AC

## 2023-01-17 LAB — COMPLETE METABOLIC PANEL WITH GFR
AG Ratio: 1.4 (calc) (ref 1.0–2.5)
ALT: 23 U/L (ref 6–29)
AST: 33 U/L (ref 10–35)
Albumin: 4.4 g/dL (ref 3.6–5.1)
Alkaline phosphatase (APISO): 76 U/L (ref 37–153)
BUN: 17 mg/dL (ref 7–25)
CO2: 30 mmol/L (ref 20–32)
Calcium: 10.6 mg/dL — ABNORMAL HIGH (ref 8.6–10.4)
Chloride: 101 mmol/L (ref 98–110)
Creat: 0.88 mg/dL (ref 0.60–1.00)
Globulin: 3.1 g/dL (ref 1.9–3.7)
Glucose, Bld: 100 mg/dL — ABNORMAL HIGH (ref 65–99)
Potassium: 3.7 mmol/L (ref 3.5–5.3)
Sodium: 142 mmol/L (ref 135–146)
Total Bilirubin: 0.8 mg/dL (ref 0.2–1.2)
Total Protein: 7.5 g/dL (ref 6.1–8.1)
eGFR: 67 mL/min/{1.73_m2} (ref >=60)

## 2023-01-17 LAB — CBC WITH DIFFERENTIAL/PLATELET
Absolute Lymphocytes: 1875 {cells}/uL (ref 850–3900)
Absolute Monocytes: 525 {cells}/uL (ref 200–950)
Basophils Absolute: 39 {cells}/uL (ref 0–200)
Basophils Relative: 0.9 %
Eosinophils Absolute: 69 {cells}/uL (ref 15–500)
Eosinophils Relative: 1.6 %
HCT: 40.6 % (ref 35.0–45.0)
Hemoglobin: 13.1 g/dL (ref 11.7–15.5)
MCH: 27.6 pg (ref 27.0–33.0)
MCHC: 32.3 g/dL (ref 32.0–36.0)
MCV: 85.7 fL (ref 80.0–100.0)
MPV: 10.9 fL (ref 7.5–12.5)
Monocytes Relative: 12.2 %
Neutro Abs: 1793 {cells}/uL (ref 1500–7800)
Neutrophils Relative %: 41.7 %
Platelets: 278 10*3/uL (ref 140–400)
RBC: 4.74 10*6/uL (ref 3.80–5.10)
RDW: 13.8 % (ref 11.0–15.0)
Total Lymphocyte: 43.6 %
WBC: 4.3 10*3/uL (ref 3.8–10.8)

## 2023-01-17 LAB — LIPID PANEL
Cholesterol: 130 mg/dL (ref <200)
HDL: 34 mg/dL — ABNORMAL LOW (ref >=50)
LDL Cholesterol (Calc): 76 mg/dL
Non-HDL Cholesterol (Calc): 96 mg/dL (ref <130)
Total CHOL/HDL Ratio: 3.8 (calc) (ref <5.0)
Triglycerides: 116 mg/dL (ref <150)

## 2023-01-17 LAB — HEMOGLOBIN A1C
Hgb A1c MFr Bld: 6 %{Hb} — ABNORMAL HIGH (ref <5.7)
Mean Plasma Glucose: 126 mg/dL
eAG (mmol/L): 7 mmol/L

## 2023-01-17 LAB — MAGNESIUM: Magnesium: 1.5 mg/dL (ref 1.5–2.5)

## 2023-01-17 LAB — TSH: TSH: 1.26 m[IU]/L (ref 0.40–4.50)

## 2023-01-17 LAB — INSULIN, RANDOM: Insulin: 10.5 u[IU]/mL

## 2023-01-17 LAB — VITAMIN D 25 HYDROXY (VIT D DEFICIENCY, FRACTURES): Vit D, 25-Hydroxy: 98 ng/mL (ref 30–100)

## 2023-01-17 NOTE — Progress Notes (Signed)
 [] [] [] [] [] [] [] [] [] [] [] [] [] [] [] [] [] [] [] [] [] [] [] [] [] [] [] [] [] [] [] [] [] [] [] [] [] [] [] [] [] ][] [] [] [] [] [] [] [] [] [] [] [] [] [] [] [] [] [] [] [] [] [] [[] [] [] [] []   [] [] [] [] [] [] [] [] [] [] [] [] [] [] [] [] [] [] [] [] [] [] [] [] [] [] [] [] [] [] [] [] [] [] [] [] [] [] [] [] [] ][] [] [] [] [] [] [] [] [] [] [] [] [] [] [] [] [] [] [] [] [] [] [[] [] [] [] []  -Test results slightly outside the reference range are not unusual. If there is anything important, I will review this with you,  otherwise it is considered normal test values.  If you have further questions,  please do not hesitate to contact me at the office or via My Chart.  [] [] [] [] [] [] [] [] [] [] [] [] [] [] [] [] [] [] [] [] [] [] [] [] [] [] [] [] [] [] [] [] [] [] [] [] [] [] [] [] [] ][] [] [] [] [] [] [] [] [] [] [] [] [] [] [] [] [] [] [] [] [] [] [[] [] [] [] []   [] [] [] [] [] [] [] [] [] [] [] [] [] [] [] [] [] [] [] [] [] [] [] [] [] [] [] [] [] [] [] [] [] [] [] [] [] [] [] [] [] ][] [] [] [] [] [] [] [] [] [] [] [] [] [] [] [] [] [] [] [] [] [] [[] [] [] [] []   -  Chol = 130   & LDL = 76   -   Both  Excellent   - Very low risk for Heart Attack  / Stroke  [] [] [] [] [] [] [] [] [] [] [] [] [] [] [] [] [] [] [] [] [] [] [] [] [] [] [] [] [] [] [] [] [] [] [] [] [] [] [] [] [] ][] [] [] [] [] [] [] [] [] [] [] [] [] [] [] [] [] [] [] [] [] [] [[] [] [] [] []   -  A1c = 6.0%   Blood sugar and A1c are STILL elevated in the borderline and                                                            early or pre-diabetes range which has the same   300% increased risk for heart attack, stroke, cancer and                                                alzheimer- type vascular dementia as full blown diabetes.   But the good news is that diet, exercise with weight loss can                                                                                 cure the early diabetes at this point.  [] [] [] [] [] [] [] [] [] [] [] [] [] [] [] [] [] [] [] [] [] [] [] [] [] [] [] [] [] [] [] [] [] [] [] [] [] [] [] [] [] ][] [] [] [] [] [] [] [] [] [] [] [] [] [] [] [] [] [] [] [] [] [] [[] [] [] [] []   -  It is very important that you work harder with diet by                                  avoiding all foods that are white except chicken, fish & calliflower.  - Avoid  white rice  (brown & wild rice is OK),   - Avoid white potatoes  (sweet potatoes in moderation is OK),   White bread or wheat bread or anything made out of                                               white flour like bagels, donuts, rolls, buns, biscuits, cakes,  - pastries, cookies, pizza crust, and pasta (made from white flour & egg whites)   - vegetarian pasta or spinach or wheat pasta is OK.  - Multigrain breads like Arnold's, Pepperidge Farm or                                                            multigrain sandwich thins or high fiber breads like  Eureka bread or Dave's Killer breads that are 4 to 5 grams fiber per slice !  are best.    Diet, exercise and weight loss can reverse and cure diabetes in the early stages.    - Diet, exercise and weight loss is very important in the   control and prevention of complications of Pre-diabetes which  affects every system in your body, ie.   -Brain - dementia/stroke,   - eyes - glaucoma/blindness,   - heart - heart attack/heart failure,   - kidneys - dialysis,   - stomach - gastric paralysis,   - intestines - malabsorption,   - nerves - severe painful neuritis,   - circulation - gangrene & loss of a leg(s)   - and finally  . . . . . . . . . . . . . . . . . .    - cancer and Alzheimers.  [] [] [] [] [] [] [] [] [] [] [] [] [] [] [] [] [] [] [] [] [] [] [] [] [] [] [] [] [] [] [] [] [] [] [] [] [] [] [] [] [] ][] [] [] [] [] [] [] [] [] [] [] [] [] [] [] [] [] [] [] [] [] [] [[] [] [] [] []   -   Magnesium  = 1.5  is very,  very  low - goal is betw 2.0 - 2.5,    - So..............SABRA  Recommend that you take                                                               Magnesium 500 mg tablet 3 x / day with Meals     - also important to eat lots of  leafy green vegetables   - spinach - Kale - collards - greens - okra - asparagus  - broccoli - quinoa - squash - almonds   - black, red, white beans  -  peas - green  beans  [] [] [] [] [] [] [] [] [] [] [] [] [] [] [] [] [] [] [] [] [] [] [] [] [] [] [] [] [] [] [] [] [] [] [] [] [] [] [] [] [] ][] [] [] [] [] [] [] [] [] [] [] [] [] [] [] [] [] [] [] [] [] [] [[] [] [] [] []   -  Vitamin D  = 98 - Excellent   !    Please keep dose same    [] [] [] [] [] [] [] [] [] [] [] [] [] [] [] [] [] [] [] [] [] [] [] [] [] [] [] [] [] [] [] [] [] [] [] [] [] [] [] [] [] ][] [] [] [] [] [] [] [] [] [] [] [] [] [] [] [] [] [] [] [] [] [] [[] [] [] [] []   -  All Else - CBC - Kidneys - Electrolytes - Liver - Magnesium & Thyroid    - all  Normal / OK  [] [] [] [] [] [] [] [] [] [] [] [] [] [] [] [] [] [] [] [] [] [] [] [] [] [] [] [] [] [] [] [] [] [] [] [] [] [] [] [] [] ][] [] [] [] [] [] [] [] [] [] [] [] [] [] [] [] [] [] [] [] [] [] [[] [] [] [] [] 

## 2023-03-15 ENCOUNTER — Other Ambulatory Visit: Payer: Self-pay

## 2023-03-15 MED ORDER — ENALAPRIL MALEATE 20 MG PO TABS: ORAL_TABLET | ORAL | 0 refills | Status: AC

## 2023-03-26 ENCOUNTER — Ambulatory Visit: Payer: Medicare Other | Admitting: Nurse Practitioner

## 2023-03-27 ENCOUNTER — Other Ambulatory Visit: Payer: Self-pay

## 2023-03-27 MED ORDER — SOLIFENACIN SUCCINATE 5 MG PO TABS: ORAL_TABLET | ORAL | 0 refills | Status: AC

## 2023-04-16 ENCOUNTER — Ambulatory Visit: Payer: Medicare Other | Admitting: Nurse Practitioner

## 2023-06-09 ENCOUNTER — Other Ambulatory Visit: Payer: Self-pay | Admitting: Family

## 2023-06-19 ENCOUNTER — Other Ambulatory Visit: Payer: Self-pay | Admitting: Family

## 2023-08-01 ENCOUNTER — Encounter: Payer: Medicare Other | Admitting: Internal Medicine

## 2023-08-02 ENCOUNTER — Encounter: Payer: Medicare Other | Admitting: Internal Medicine

## 2023-12-06 ENCOUNTER — Other Ambulatory Visit: Payer: Self-pay | Admitting: Nurse Practitioner

## 2023-12-06 DIAGNOSIS — Z1231 Encounter for screening mammogram for malignant neoplasm of breast: Secondary | ICD-10-CM

## 2024-01-18 ENCOUNTER — Ambulatory Visit
Admission: RE | Admit: 2024-01-18 | Discharge: 2024-01-18 | Disposition: A | Discharge status: Home / Self Care | Source: Ambulatory Visit | Attending: Nurse Practitioner | Admitting: Nurse Practitioner

## 2024-01-18 DIAGNOSIS — Z1231 Encounter for screening mammogram for malignant neoplasm of breast: Secondary | ICD-10-CM
